# Patient Record
Sex: Female | Born: 1975 | Race: Black or African American | Hispanic: No | Smoking: Former smoker
Health system: Southern US, Community
[De-identification: ages and names within clinical notes are randomized; demographics above are authoritative.]

## PROBLEM LIST (undated history)

## (undated) DIAGNOSIS — Z9889 Other specified postprocedural states: Secondary | ICD-10-CM

## (undated) DIAGNOSIS — D649 Anemia, unspecified: Secondary | ICD-10-CM

## (undated) DIAGNOSIS — N3090 Cystitis, unspecified without hematuria: Secondary | ICD-10-CM

## (undated) DIAGNOSIS — R112 Nausea with vomiting, unspecified: Secondary | ICD-10-CM

## (undated) DIAGNOSIS — R51 Headache: Secondary | ICD-10-CM

## (undated) DIAGNOSIS — T884XXA Failed or difficult intubation, initial encounter: Secondary | ICD-10-CM

## (undated) DIAGNOSIS — R519 Headache, unspecified: Secondary | ICD-10-CM

## (undated) DIAGNOSIS — K219 Gastro-esophageal reflux disease without esophagitis: Secondary | ICD-10-CM

## (undated) DIAGNOSIS — I10 Essential (primary) hypertension: Secondary | ICD-10-CM

## (undated) DIAGNOSIS — N83209 Unspecified ovarian cyst, unspecified side: Secondary | ICD-10-CM

## (undated) DIAGNOSIS — K047 Periapical abscess without sinus: Secondary | ICD-10-CM

## (undated) DIAGNOSIS — T753XXA Motion sickness, initial encounter: Secondary | ICD-10-CM

## (undated) HISTORY — PX: ABDOMINAL HYSTERECTOMY: SHX81

## (undated) HISTORY — PX: WISDOM TOOTH EXTRACTION: SHX21

## (undated) HISTORY — PX: SPINAL FUSION: SHX223

## (undated) HISTORY — PX: CHOLECYSTECTOMY: SHX55

---

## 2004-10-26 ENCOUNTER — Emergency Department: Payer: Self-pay | Admitting: General Practice

## 2004-10-28 ENCOUNTER — Ambulatory Visit: Payer: Self-pay | Admitting: Emergency Medicine

## 2007-11-24 ENCOUNTER — Other Ambulatory Visit: Payer: Self-pay

## 2007-11-24 ENCOUNTER — Emergency Department: Payer: Self-pay | Admitting: Emergency Medicine

## 2008-06-17 ENCOUNTER — Emergency Department: Payer: Self-pay | Admitting: Internal Medicine

## 2008-06-20 ENCOUNTER — Emergency Department: Payer: Self-pay | Admitting: Emergency Medicine

## 2008-11-09 ENCOUNTER — Emergency Department: Payer: Self-pay | Admitting: Emergency Medicine

## 2010-02-14 ENCOUNTER — Emergency Department: Payer: Self-pay | Admitting: Emergency Medicine

## 2010-10-18 ENCOUNTER — Emergency Department: Payer: Self-pay | Admitting: Emergency Medicine

## 2011-08-20 ENCOUNTER — Emergency Department: Payer: Self-pay | Admitting: Internal Medicine

## 2011-08-20 ENCOUNTER — Ambulatory Visit: Payer: Self-pay | Admitting: Family Medicine

## 2011-11-28 ENCOUNTER — Emergency Department: Payer: Self-pay | Admitting: Emergency Medicine

## 2011-11-28 LAB — CBC
HCT: 39.8 % (ref 35.0–47.0)
HGB: 13.3 g/dL (ref 12.0–16.0)
MCHC: 33.5 g/dL (ref 32.0–36.0)
RDW: 13.7 % (ref 11.5–14.5)
WBC: 8.1 10*3/uL (ref 3.6–11.0)

## 2011-11-28 LAB — URINALYSIS, COMPLETE
Bilirubin,UR: NEGATIVE
Blood: NEGATIVE
Ketone: NEGATIVE
Nitrite: NEGATIVE
Ph: 7 (ref 4.5–8.0)
Protein: 30
Specific Gravity: 1.021 (ref 1.003–1.030)
Squamous Epithelial: 2
WBC UR: <1 /HPF (ref 0–5)

## 2011-11-28 LAB — COMPREHENSIVE METABOLIC PANEL
Calcium, Total: 9 mg/dL (ref 8.5–10.1)
Chloride: 104 mmol/L (ref 98–107)
Co2: 27 mmol/L (ref 21–32)
EGFR (African American): >60
EGFR (Non-African Amer.): >60
Glucose: 114 mg/dL — ABNORMAL HIGH (ref 65–99)
SGOT(AST): 21 U/L (ref 15–37)
SGPT (ALT): 20 U/L
Sodium: 139 mmol/L (ref 136–145)

## 2012-11-21 ENCOUNTER — Emergency Department: Payer: Self-pay | Admitting: Emergency Medicine

## 2012-11-21 LAB — URINALYSIS, COMPLETE
Glucose,UR: NEGATIVE mg/dL (ref 0–75)
Ketone: NEGATIVE
Ph: 6 (ref 4.5–8.0)
RBC,UR: NONE SEEN /HPF (ref 0–5)
Squamous Epithelial: 2
WBC UR: <1 /HPF (ref 0–5)

## 2012-11-21 LAB — LIPASE, BLOOD: Lipase: 147 U/L (ref 73–393)

## 2012-11-21 LAB — COMPREHENSIVE METABOLIC PANEL
Alkaline Phosphatase: 53 U/L (ref 50–136)
BUN: 8 mg/dL (ref 7–18)
Chloride: 107 mmol/L (ref 98–107)
Creatinine: 0.68 mg/dL (ref 0.60–1.30)
EGFR (African American): >60
Glucose: 95 mg/dL (ref 65–99)
Osmolality: 274 (ref 275–301)
SGOT(AST): 14 U/L — ABNORMAL LOW (ref 15–37)
SGPT (ALT): 15 U/L (ref 12–78)
Sodium: 138 mmol/L (ref 136–145)

## 2012-11-21 LAB — CBC
HCT: 36.4 % (ref 35.0–47.0)
MCH: 31.1 pg (ref 26.0–34.0)
MCHC: 33.9 g/dL (ref 32.0–36.0)
MCV: 92 fL (ref 80–100)
Platelet: 175 10*3/uL (ref 150–440)
RDW: 13.4 % (ref 11.5–14.5)

## 2013-08-18 ENCOUNTER — Ambulatory Visit: Payer: Self-pay | Admitting: Internal Medicine

## 2013-09-17 ENCOUNTER — Emergency Department: Payer: Self-pay | Admitting: Emergency Medicine

## 2013-10-19 ENCOUNTER — Ambulatory Visit: Payer: Self-pay | Admitting: Unknown Physician Specialty

## 2014-03-12 ENCOUNTER — Emergency Department: Payer: Self-pay | Admitting: Emergency Medicine

## 2014-03-14 ENCOUNTER — Emergency Department: Payer: Self-pay | Admitting: Internal Medicine

## 2014-03-29 ENCOUNTER — Ambulatory Visit: Payer: Self-pay | Admitting: Physician Assistant

## 2014-04-23 ENCOUNTER — Ambulatory Visit: Payer: Self-pay | Admitting: Unknown Physician Specialty

## 2014-10-04 ENCOUNTER — Ambulatory Visit: Payer: Self-pay | Admitting: Family Medicine

## 2014-10-18 HISTORY — PX: BACK SURGERY: SHX140

## 2015-03-26 ENCOUNTER — Ambulatory Visit
Admission: EM | Admit: 2015-03-26 | Discharge: 2015-03-26 | Disposition: A | Discharge status: Home / Self Care | Payer: BLUE CROSS/BLUE SHIELD | Attending: Internal Medicine | Admitting: Internal Medicine

## 2015-03-26 ENCOUNTER — Ambulatory Visit
Admit: 2015-03-26 | Discharge: 2015-03-26 | Disposition: A | Discharge status: Home / Self Care | Payer: BLUE CROSS/BLUE SHIELD | Attending: Internal Medicine | Admitting: Internal Medicine

## 2015-03-26 DIAGNOSIS — R102 Pelvic and perineal pain: Secondary | ICD-10-CM

## 2015-03-26 DIAGNOSIS — R1900 Intra-abdominal and pelvic swelling, mass and lump, unspecified site: Secondary | ICD-10-CM | POA: Diagnosis not present

## 2015-03-26 DIAGNOSIS — N949 Unspecified condition associated with female genital organs and menstrual cycle: Secondary | ICD-10-CM | POA: Diagnosis not present

## 2015-03-26 DIAGNOSIS — R109 Unspecified abdominal pain: Secondary | ICD-10-CM | POA: Diagnosis present

## 2015-03-26 DIAGNOSIS — R1032 Left lower quadrant pain: Secondary | ICD-10-CM

## 2015-03-26 HISTORY — DX: Unspecified ovarian cyst, unspecified side: N83.209

## 2015-03-26 HISTORY — DX: Cystitis, unspecified without hematuria: N30.90

## 2015-03-26 LAB — URINALYSIS COMPLETE WITH MICROSCOPIC (ARMC ONLY)
BILIRUBIN URINE: NEGATIVE
Bacteria, UA: NONE SEEN — AB
Glucose, UA: NEGATIVE mg/dL
HGB URINE DIPSTICK: NEGATIVE
Ketones, ur: NEGATIVE mg/dL
Leukocytes, UA: NEGATIVE
Nitrite: NEGATIVE
PROTEIN: NEGATIVE mg/dL
SPECIFIC GRAVITY, URINE: 1.025 (ref 1.005–1.030)
pH: 6.5 (ref 5.0–8.0)

## 2015-03-26 LAB — WET PREP, GENITAL
TRICH WET PREP: NONE SEEN
Yeast Wet Prep HPF POC: NONE SEEN

## 2015-03-26 MED ORDER — METRONIDAZOLE 500 MG PO TABS: 500.0000 mg | ORAL_TABLET | Freq: Two times a day (BID) | ORAL | Status: DC

## 2015-03-26 MED ORDER — HYDROCODONE-ACETAMINOPHEN 5-325 MG PO TABS: 2.0000 | ORAL_TABLET | ORAL | Status: DC | PRN

## 2015-03-26 MED ORDER — KETOROLAC TROMETHAMINE 60 MG/2ML IM SOLN
60.0000 mg | Freq: Once | INTRAMUSCULAR | Status: AC
  Administered 2015-03-26: 60 mg via INTRAMUSCULAR

## 2015-03-26 MED ORDER — NAPROXEN 500 MG PO TABS: 500.0000 mg | ORAL_TABLET | Freq: Two times a day (BID) | ORAL | Status: DC

## 2015-03-26 NOTE — Discharge Instructions (Signed)
Prescriptions for naprosyn sent to the pharmacy, and prescription for vicodin printed.  Go to hospital for pelvic ultrasound now.  Recheck at urgent care or ER if pain does not improve in a day or two.  Pelvic Pain Pelvic pain is pain felt below the belly button and between your hips. It can be caused by many different things. It is important to get help right away. This is especially true for severe, sharp, or unusual pain that comes on suddenly.  HOME CARE  Only take medicine as told by your doctor.  Rest as told by your doctor.  Eat a healthy diet, such as fruits, vegetables, and lean meats.  Drink enough fluids to keep your pee (urine) clear or pale yellow, or as told.  Avoid sex (intercourse) if it causes pain.  Apply warm or cold packs to your lower belly (abdomen). Use the type of pack that helps the pain.  Avoid situations that cause you stress.  Keep a journal to track your pain. Write down:  When the pain started.  Where it is located.  If there are things that seem to be related to the pain, such as food or your period.  Follow up with your doctor as told. GET HELP RIGHT AWAY IF:   You have heavy bleeding from the vagina.  You have more pelvic pain.  You feel lightheaded or pass out (faint).  You have chills.  You have pain when you pee or have blood in your pee.  You cannot stop having watery poop (diarrhea).  You cannot stop throwing up (vomiting).  You have a fever or lasting symptoms for more than 3 days.  You have a fever and your symptoms suddenly get worse.  You are being physically or sexually abused.  Your medicine does not help your pain.  You have fluid (discharge) coming from your vagina that is not normal. MAKE SURE YOU:  Understand these instructions.  Will watch your condition.  Will get help if you are not doing well or get worse. Document Released: 03/22/2008 Document Revised: 04/04/2012 Document Reviewed: 01/24/2012 [facility]  Patient Information 2015 [place], [place]. This information is not intended to replace advice given to you by your health care provider. Make sure you discuss any questions you have with your health care provider.

## 2015-03-26 NOTE — ED Notes (Signed)
Complains of low abdominal pain radiating to left lateral, left flank. +Nausea +Headache Hx of UTI

## 2015-03-26 NOTE — ED Provider Notes (Addendum)
CSN: [id number]     Arrival date & time 03/26/15  1245 History   First MD Initiated Contact with Patient 03/26/15 1355     Chief Complaint  Patient presents with  . Abdominal Pain   HPI   Patient presents with about a 3 day history of sharp intermittent left lower quadrant pain. It reminds her of discomfort that she has had with ovarian cysts in the past. Pain has been worsening for the last 3 days, it is nearly constant now. She has also developed some headache (frontal, bitemporal), malaise, and nausea. No vomiting . She is having some loose/watery stools and is also having her period right now. Often loose stools accompany her period. Denies pregnancy, has no female sexual partners. No fever, some night sweats. No dysuria, no urinary frequency. No cough, no runny nose or sore throat. Not short of breath.  Past Medical History  Diagnosis Date  . Bladder infection   . Ovarian cyst    Past Surgical History  Procedure Laterality Date  . Cholecystectomy    . Cesarean section     Family History  Problem Relation Age of Onset  . Diabetes Mother   . Hypertension Mother   . Diabetes Father   . Hypertension Father    History  Substance Use Topics  . Smoking status: Current Every Day Smoker -- 0.50 packs/day    Types: Cigarettes  . Smokeless tobacco: Not on file  . Alcohol Use: Yes     Comment: socially   OB History    No data available     Review of Systems  All other systems reviewed and are negative.   Allergies  Review of patient's allergies indicates no known allergies.  Home Medications    BP 142/93 mmHg  Pulse 93  Temp(Src) 98.3 F (36.8 C) (Oral)  Resp 18  Ht 5\' 8"  (1.727 m)  Wt 140 lb (63.504 kg)  BMI 21.29 kg/m2  SpO2 100%  LMP 03/22/2015 (Exact Date) Physical Exam  Constitutional: She is oriented to person, place, and time.  Alert, nicely groomed Laying down on the stretcher on her side, curled up. We'll to sit up, but tends to sit up flexed forward,  curled over abdomen.  HENT:  Head: Atraumatic.  Eyes:  Conjugate gaze, no eye redness/drainage  Neck: Neck supple.  Cardiovascular: Normal rate and regular rhythm.   Pulmonary/Chest: No respiratory distress. She has no wheezes. She has no rales.  Lungs clear, symmetric breath sounds  Abdominal: She exhibits no distension. There is no rebound and no guarding.  Mild to moderate diffuse tenderness to deep palpation, nonfocal, although patient perceives discomfort to be worse in the left lower quadrant at rest.  Genitourinary:  Vaginal mucosa is unremarkable. There is equivocal cervical motion tenderness. No adnexal enlargement, equivocal adnexal tenderness.  Musculoskeletal: Normal range of motion.  No leg swelling  Neurological: She is alert and oriented to person, place, and time.  Skin: Skin is warm and dry.  No cyanosis  Nursing note and vitals reviewed.   ED Course  Procedures (including critical care time) Labs Review Labs Reviewed  WET PREP, GENITAL - Abnormal; Notable for the following:    Clue Cells Wet Prep HPF POC FEW (*)    WBC, Wet Prep HPF POC FEW (*)    All other components within normal limits  URINALYSIS COMPLETEWITH MICROSCOPIC ([facility] ONLY) - Abnormal; Notable for the following:    Bacteria, UA NONE SEEN (*)    Squamous Epithelial /  LPF 6-30 (*)    All other components within normal limits  CHLAMYDIA/NGC RT PCR ([facility] ONLY)  URINE CULTURE   Wet prep is notable for a few clue cells, a few wbc's.  UA positive for squamous/epithelial cells.  Pt received IM toradol 60mg  at the urgent care for pain, with modest relief of pain.   Imaging Review US Transvaginal Non-ob  03/26/2015   CLINICAL DATA:  Pelvic pain for 3 days.  EXAM: TRANSABDOMINAL AND TRANSVAGINAL ULTRASOUND OF PELVIS  TECHNIQUE: Both transabdominal and transvaginal ultrasound examinations of the pelvis were performed. Transabdominal technique was performed for global imaging of the pelvis  including uterus, ovaries, adnexal regions, and pelvic cul-de-sac. It was necessary to proceed with endovaginal exam following the transabdominal exam to visualize the ovaries.  COMPARISON:  None  FINDINGS: Uterus  Measurements: 10.7 x 3.5 x 6.5 cm there is a 4.8 x 3.8 x 4.9 cm heterogeneous mass within the lower uterine segment.  Endometrium  Thickness: 6 mm. The endometrium was partially obscured by the lower uterine segment mass.  Right ovary  Measurements: 3.8 x 2.9 x 1.77. Normal appearance/no adnexal mass.  Left ovary  Measurements: 3.1 x 3.1 x 1.6 cm. Normal appearance/no adnexal mass.  Other findings  No free fluid.  IMPRESSION: There is a 4.9 cm mass in the lower uterine segment that is most likely a fibroid. This can be confirmed with a pelvic MRI.   Electronically Signed   By: Marybelle Killings M.D.   On: 03/26/2015 18:34   US Pelvis Complete  03/26/2015   CLINICAL DATA:  Pelvic pain for 3 days.  EXAM: TRANSABDOMINAL AND TRANSVAGINAL ULTRASOUND OF PELVIS  TECHNIQUE: Both transabdominal and transvaginal ultrasound examinations of the pelvis were performed. Transabdominal technique was performed for global imaging of the pelvis including uterus, ovaries, adnexal regions, and pelvic cul-de-sac. It was necessary to proceed with endovaginal exam following the transabdominal exam to visualize the ovaries.  COMPARISON:  None  FINDINGS: Uterus  Measurements: 10.7 x 3.5 x 6.5 cm there is a 4.8 x 3.8 x 4.9 cm heterogeneous mass within the lower uterine segment.  Endometrium  Thickness: 6 mm. The endometrium was partially obscured by the lower uterine segment mass.  Right ovary  Measurements: 3.8 x 2.9 x 1.77. Normal appearance/no adnexal mass.  Left ovary  Measurements: 3.1 x 3.1 x 1.6 cm. Normal appearance/no adnexal mass.  Other findings  No free fluid.  IMPRESSION: There is a 4.9 cm mass in the lower uterine segment that is most likely a fibroid. This can be confirmed with a pelvic MRI.   Electronically Signed    By: Marybelle Killings M.D.   On: 03/26/2015 18:34     MDM   1. Acute pelvic pain, female   2. LLQ pain    Ultrasound demonstrates fibroid uterus.  Discussed with patient by phone 03/27/15 10:50 am; pt given office number for [facility] and encouraged to followup for persistent discomfort, to discuss further management options.  Pelvic pain is unchanged this am.  Rx naprosyn, rx #6 vicodin, and rx flagyl sent to pharmacy/given to pt last night at discharge.    Sherlene Shams, MD 03/27/15 [place], MD 03/27/15 [phone]

## 2015-03-27 ENCOUNTER — Telehealth: Payer: Self-pay | Admitting: Internal Medicine

## 2015-03-27 LAB — CHLAMYDIA/NGC RT PCR (ARMC ONLY)
CHLAMYDIA TR: NOT DETECTED
N gonorrhoeae: NOT DETECTED

## 2015-03-28 LAB — URINE CULTURE

## 2015-04-29 ENCOUNTER — Encounter
Admission: RE | Admit: 2015-04-29 | Discharge: 2015-04-29 | Disposition: A | Discharge status: Home / Self Care | Payer: BLUE CROSS/BLUE SHIELD | Source: Ambulatory Visit | Attending: Obstetrics and Gynecology | Admitting: Obstetrics and Gynecology

## 2015-04-29 DIAGNOSIS — Z833 Family history of diabetes mellitus: Secondary | ICD-10-CM | POA: Diagnosis not present

## 2015-04-29 DIAGNOSIS — Z808 Family history of malignant neoplasm of other organs or systems: Secondary | ICD-10-CM | POA: Diagnosis not present

## 2015-04-29 DIAGNOSIS — Z01812 Encounter for preprocedural laboratory examination: Secondary | ICD-10-CM | POA: Insufficient documentation

## 2015-04-29 DIAGNOSIS — Z8249 Family history of ischemic heart disease and other diseases of the circulatory system: Secondary | ICD-10-CM | POA: Insufficient documentation

## 2015-04-29 HISTORY — DX: Gastro-esophageal reflux disease without esophagitis: K21.9

## 2015-04-29 HISTORY — DX: Anemia, unspecified: D64.9

## 2015-04-29 LAB — BASIC METABOLIC PANEL
Anion gap: 12 (ref 5–15)
BUN: 6 mg/dL (ref 6–20)
CALCIUM: 9 mg/dL (ref 8.9–10.3)
CHLORIDE: 99 mmol/L — AB (ref 101–111)
CO2: 25 mmol/L (ref 22–32)
Creatinine, Ser: 0.91 mg/dL (ref 0.44–1.00)
GFR calc Af Amer: >60 mL/min (ref >60)
Glucose, Bld: 110 mg/dL — ABNORMAL HIGH (ref 65–99)
POTASSIUM: 2.8 mmol/L — AB (ref 3.5–5.1)
Sodium: 136 mmol/L (ref 135–145)

## 2015-04-29 LAB — TYPE AND SCREEN
ABO/RH(D): A POS
Antibody Screen: NEGATIVE

## 2015-04-29 LAB — CBC
HCT: 38.6 % (ref 35.0–47.0)
Hemoglobin: 12.9 g/dL (ref 12.0–16.0)
MCH: 30.9 pg (ref 26.0–34.0)
MCHC: 33.3 g/dL (ref 32.0–36.0)
MCV: 92.9 fL (ref 80.0–100.0)
Platelets: 210 10*3/uL (ref 150–440)
RBC: 4.16 MIL/uL (ref 3.80–5.20)
RDW: 13.7 % (ref 11.5–14.5)
WBC: 8.1 10*3/uL (ref 3.6–11.0)

## 2015-04-29 LAB — ABO/RH: ABO/RH(D): A POS

## 2015-04-29 NOTE — Patient Instructions (Signed)
  Your procedure is scheduled on: 05/06/15 Tues Report to Day Surgery. To find out your arrival time please call [phone] between 1PM - 3PM on 05/05/15 Mon.  Remember: Instructions that are not followed completely may result in serious medical risk, up to and including death, or upon the discretion of your surgeon and anesthesiologist your surgery may need to be rescheduled.    __x__ 1. Do not eat food or drink liquids after midnight. No gum chewing or hard candies.     __x__ 2. No Alcohol for 24 hours before or after surgery.   ____ 3. Bring all medications with you on the day of surgery if instructed.    __x__ 4. Notify your doctor if there is any change in your medical condition     (cold, fever, infections).     Do not wear jewelry, make-up, hairpins, clips or nail polish.  Do not wear lotions, powders, or perfumes. You may wear deodorant.  Do not shave 48 hours prior to surgery. Men may shave face and neck.  Do not bring valuables to the hospital.    [facility] is not responsible for any belongings or valuables.               Contacts, dentures or bridgework may not be worn into surgery.  Leave your suitcase in the car. After surgery it may be brought to your room.  For patients admitted to the hospital, discharge time is determined by your                treatment team.   Patients discharged the day of surgery will not be allowed to drive home.   Please read over the following fact sheets that you were given:      ____ Take these medicines the morning of surgery with A SIP OF WATER:    1.   2.   3.   4.  5.  6.  ____ Fleet Enema (as directed)   _x___ Use CHG Soap as directed  ____ Use inhalers on the day of surgery  ____ Stop metformin 2 days prior to surgery    ____ Take 1/2 of usual insulin dose the night before surgery and none on the morning of surgery.   ____ Stop Coumadin/Plavix/aspirin on   _x___ Stop Anti-inflammatories on today 04/29/15   ____  Stop supplements until after surgery.    ____ Bring C-Pap to the hospital.

## 2015-04-29 NOTE — OR Nursing (Signed)
Dr Ilda Basset notifed of need for potassium supplement.

## 2015-04-29 NOTE — OR Nursing (Signed)
Dr Ilda Basset notified regarding patients elevated BP, and informed patient does not have a primary care physician.

## 2015-05-06 ENCOUNTER — Ambulatory Visit: Payer: BLUE CROSS/BLUE SHIELD | Admitting: Anesthesiology

## 2015-05-06 ENCOUNTER — Observation Stay
Admission: RE | Admit: 2015-05-06 | Discharge: 2015-05-07 | Disposition: A | Discharge status: Home / Self Care | Payer: BLUE CROSS/BLUE SHIELD | Source: Ambulatory Visit | Attending: Obstetrics and Gynecology | Admitting: Obstetrics and Gynecology

## 2015-05-06 ENCOUNTER — Encounter: Payer: Self-pay | Admitting: *Deleted

## 2015-05-06 ENCOUNTER — Encounter
Admission: RE | Disposition: A | Discharge status: Home / Self Care | Payer: Self-pay | Source: Ambulatory Visit | Attending: Obstetrics and Gynecology

## 2015-05-06 DIAGNOSIS — Z841 Family history of disorders of kidney and ureter: Secondary | ICD-10-CM | POA: Diagnosis not present

## 2015-05-06 DIAGNOSIS — N920 Excessive and frequent menstruation with regular cycle: Secondary | ICD-10-CM | POA: Insufficient documentation

## 2015-05-06 DIAGNOSIS — Z9049 Acquired absence of other specified parts of digestive tract: Secondary | ICD-10-CM | POA: Diagnosis not present

## 2015-05-06 DIAGNOSIS — D251 Intramural leiomyoma of uterus: Secondary | ICD-10-CM | POA: Diagnosis not present

## 2015-05-06 DIAGNOSIS — Z8249 Family history of ischemic heart disease and other diseases of the circulatory system: Secondary | ICD-10-CM | POA: Insufficient documentation

## 2015-05-06 DIAGNOSIS — Z833 Family history of diabetes mellitus: Secondary | ICD-10-CM | POA: Diagnosis not present

## 2015-05-06 DIAGNOSIS — Z791 Long term (current) use of non-steroidal anti-inflammatories (NSAID): Secondary | ICD-10-CM | POA: Diagnosis not present

## 2015-05-06 DIAGNOSIS — N888 Other specified noninflammatory disorders of cervix uteri: Secondary | ICD-10-CM | POA: Diagnosis not present

## 2015-05-06 DIAGNOSIS — F17200 Nicotine dependence, unspecified, uncomplicated: Secondary | ICD-10-CM | POA: Insufficient documentation

## 2015-05-06 DIAGNOSIS — Z9889 Other specified postprocedural states: Secondary | ICD-10-CM | POA: Insufficient documentation

## 2015-05-06 DIAGNOSIS — D259 Leiomyoma of uterus, unspecified: Secondary | ICD-10-CM | POA: Diagnosis present

## 2015-05-06 DIAGNOSIS — Z90710 Acquired absence of both cervix and uterus: Secondary | ICD-10-CM | POA: Diagnosis present

## 2015-05-06 DIAGNOSIS — N800 Endometriosis of uterus: Secondary | ICD-10-CM | POA: Diagnosis not present

## 2015-05-06 DIAGNOSIS — N946 Dysmenorrhea, unspecified: Secondary | ICD-10-CM | POA: Diagnosis not present

## 2015-05-06 DIAGNOSIS — R109 Unspecified abdominal pain: Secondary | ICD-10-CM | POA: Diagnosis not present

## 2015-05-06 DIAGNOSIS — I10 Essential (primary) hypertension: Secondary | ICD-10-CM | POA: Diagnosis not present

## 2015-05-06 DIAGNOSIS — Z800 Family history of malignant neoplasm of digestive organs: Secondary | ICD-10-CM | POA: Insufficient documentation

## 2015-05-06 DIAGNOSIS — N838 Other noninflammatory disorders of ovary, fallopian tube and broad ligament: Secondary | ICD-10-CM | POA: Diagnosis present

## 2015-05-06 HISTORY — PX: CYSTOSCOPY: SHX5120

## 2015-05-06 HISTORY — PX: LAPAROSCOPIC HYSTERECTOMY: SHX1926

## 2015-05-06 LAB — POCT I-STAT 3, (NA,K, HGB,HCT): Potassium: 2.9 mmol/L — AB (ref 3.4–5.3)

## 2015-05-06 LAB — POCT PREGNANCY, URINE: PREG TEST UR: NEGATIVE

## 2015-05-06 SURGERY — HYSTERECTOMY, TOTAL, LAPAROSCOPIC
Anesthesia: General | Wound class: Clean Contaminated

## 2015-05-06 MED ORDER — SCOPOLAMINE 1 MG/3DAYS TD PT72
1.0000 | MEDICATED_PATCH | TRANSDERMAL | Status: DC
  Administered 2015-05-07: 1.5 mg via TRANSDERMAL
  Filled 2015-05-06 (×2): qty 1

## 2015-05-06 MED ORDER — HYDROMORPHONE HCL 1 MG/ML IJ SOLN
0.5000 mg | INTRAMUSCULAR | Status: DC | PRN
  Administered 2015-05-06: 0.5 mg via INTRAVENOUS
  Filled 2015-05-06: qty 1

## 2015-05-06 MED ORDER — SIMETHICONE 80 MG PO CHEW
80.0000 mg | CHEWABLE_TABLET | Freq: Two times a day (BID) | ORAL | Status: DC
Start: 2015-05-06 — End: 2015-05-07

## 2015-05-06 MED ORDER — BISACODYL 5 MG PO TBEC: 5.0000 mg | DELAYED_RELEASE_TABLET | Freq: Every day | ORAL | Status: DC | PRN

## 2015-05-06 MED ORDER — ONDANSETRON HCL 4 MG/2ML IJ SOLN
INTRAMUSCULAR | Status: DC | PRN
  Administered 2015-05-06: 4 mg via INTRAVENOUS

## 2015-05-06 MED ORDER — FAMOTIDINE 20 MG PO TABS
20.0000 mg | ORAL_TABLET | Freq: Once | ORAL | Status: AC
  Administered 2015-05-06: 20 mg via ORAL

## 2015-05-06 MED ORDER — HYDROMORPHONE HCL 1 MG/ML IJ SOLN
0.5000 mg | Freq: Once | INTRAMUSCULAR | Status: AC
  Administered 2015-05-06: 0.5 mg via INTRAVENOUS
  Filled 2015-05-06: qty 1

## 2015-05-06 MED ORDER — DEXAMETHASONE SODIUM PHOSPHATE 4 MG/ML IJ SOLN
INTRAMUSCULAR | Status: DC | PRN
  Administered 2015-05-06: 10 mg via INTRAVENOUS

## 2015-05-06 MED ORDER — PROPOFOL 10 MG/ML IV BOLUS
INTRAVENOUS | Status: DC | PRN
  Administered 2015-05-06: 140 mg via INTRAVENOUS

## 2015-05-06 MED ORDER — POTASSIUM CHLORIDE CRYS ER 20 MEQ PO TBCR
40.0000 meq | EXTENDED_RELEASE_TABLET | Freq: Three times a day (TID) | ORAL | Status: DC
  Administered 2015-05-07 (×2): 40 meq via ORAL
  Filled 2015-05-06 (×2): qty 2

## 2015-05-06 MED ORDER — BUPIVACAINE HCL (PF) 0.5 % IJ SOLN
INTRAMUSCULAR | Status: AC
  Filled 2015-05-06: qty 30

## 2015-05-06 MED ORDER — LACTATED RINGERS IV SOLN
INTRAVENOUS | Status: DC
  Administered 2015-05-06: 15:00:00 via INTRAVENOUS

## 2015-05-06 MED ORDER — ROCURONIUM BROMIDE 100 MG/10ML IV SOLN
INTRAVENOUS | Status: DC | PRN
  Administered 2015-05-06: 10 mg via INTRAVENOUS
  Administered 2015-05-06: 20 mg via INTRAVENOUS
  Administered 2015-05-06: 10 mg via INTRAVENOUS
  Administered 2015-05-06: 20 mg via INTRAVENOUS
  Administered 2015-05-06: 30 mg via INTRAVENOUS

## 2015-05-06 MED ORDER — METOPROLOL TARTRATE 1 MG/ML IV SOLN
INTRAVENOUS | Status: DC | PRN
  Administered 2015-05-06: 3 mg via INTRAVENOUS

## 2015-05-06 MED ORDER — ONDANSETRON HCL 4 MG/2ML IJ SOLN
4.0000 mg | Freq: Four times a day (QID) | INTRAMUSCULAR | Status: DC | PRN
  Administered 2015-05-06 – 2015-05-07 (×4): 4 mg via INTRAVENOUS
  Filled 2015-05-06 (×4): qty 2

## 2015-05-06 MED ORDER — POLYETHYLENE GLYCOL 3350 17 G PO PACK
17.0000 g | PACK | Freq: Every day | ORAL | Status: DC
  Administered 2015-05-07: 17 g via ORAL
  Filled 2015-05-06: qty 1

## 2015-05-06 MED ORDER — HYDROMORPHONE HCL 1 MG/ML IJ SOLN
0.5000 mg | INTRAMUSCULAR | Status: AC | PRN
  Administered 2015-05-06 (×4): 0.5 mg via INTRAVENOUS

## 2015-05-06 MED ORDER — LIDOCAINE HCL (CARDIAC) 20 MG/ML IV SOLN
INTRAVENOUS | Status: DC | PRN
  Administered 2015-05-06: 100 mg via INTRAVENOUS

## 2015-05-06 MED ORDER — CEFAZOLIN SODIUM-DEXTROSE 2-3 GM-% IV SOLR
INTRAVENOUS | Status: AC
  Administered 2015-05-06: 2 g via INTRAVENOUS
  Filled 2015-05-06: qty 50

## 2015-05-06 MED ORDER — NICOTINE 14 MG/24HR TD PT24
14.0000 mg | MEDICATED_PATCH | Freq: Every day | TRANSDERMAL | Status: DC
Start: 2015-05-06 — End: 2015-05-07
  Administered 2015-05-06 – 2015-05-07 (×2): 14 mg via TRANSDERMAL
  Filled 2015-05-06 (×2): qty 1

## 2015-05-06 MED ORDER — GLYCOPYRROLATE 0.2 MG/ML IJ SOLN
INTRAMUSCULAR | Status: DC | PRN
  Administered 2015-05-06: 0.6 mg via INTRAVENOUS

## 2015-05-06 MED ORDER — HYDROMORPHONE HCL 1 MG/ML IJ SOLN
INTRAMUSCULAR | Status: AC
  Administered 2015-05-06: 0.5 mg via INTRAVENOUS
  Filled 2015-05-06: qty 1

## 2015-05-06 MED ORDER — FENTANYL CITRATE (PF) 100 MCG/2ML IJ SOLN
INTRAMUSCULAR | Status: DC | PRN
  Administered 2015-05-06 (×7): 50 ug via INTRAVENOUS

## 2015-05-06 MED ORDER — MIDAZOLAM HCL 2 MG/2ML IJ SOLN
INTRAMUSCULAR | Status: DC | PRN
  Administered 2015-05-06: 2 mg via INTRAVENOUS

## 2015-05-06 MED ORDER — CEFAZOLIN SODIUM-DEXTROSE 2-3 GM-% IV SOLR
INTRAVENOUS | Status: DC | PRN
  Administered 2015-05-06: 2 g via INTRAVENOUS

## 2015-05-06 MED ORDER — ONDANSETRON HCL 4 MG/2ML IJ SOLN: 4.0000 mg | Freq: Once | INTRAMUSCULAR | Status: DC | PRN

## 2015-05-06 MED ORDER — IBUPROFEN 600 MG PO TABS
600.0000 mg | ORAL_TABLET | Freq: Four times a day (QID) | ORAL | Status: DC | PRN
  Administered 2015-05-06 – 2015-05-07 (×2): 600 mg via ORAL
  Filled 2015-05-06 (×2): qty 1

## 2015-05-06 MED ORDER — FENTANYL CITRATE (PF) 100 MCG/2ML IJ SOLN
25.0000 ug | INTRAMUSCULAR | Status: DC | PRN
  Administered 2015-05-06 (×4): 25 ug via INTRAVENOUS

## 2015-05-06 MED ORDER — BUPIVACAINE HCL 0.5 % IJ SOLN
INTRAMUSCULAR | Status: DC | PRN
  Administered 2015-05-06: 18 mL

## 2015-05-06 MED ORDER — HYDROCODONE-ACETAMINOPHEN 5-325 MG PO TABS
1.0000 | ORAL_TABLET | Freq: Four times a day (QID) | ORAL | Status: DC | PRN
  Administered 2015-05-06 – 2015-05-07 (×4): 2 via ORAL
  Filled 2015-05-06 (×4): qty 2

## 2015-05-06 MED ORDER — FENTANYL CITRATE (PF) 100 MCG/2ML IJ SOLN
INTRAMUSCULAR | Status: AC
  Administered 2015-05-06: 25 ug via INTRAVENOUS
  Filled 2015-05-06: qty 2

## 2015-05-06 MED ORDER — POTASSIUM CHLORIDE 2 MEQ/ML IV SOLN
INTRAVENOUS | Status: DC
  Administered 2015-05-06 – 2015-05-07 (×2): via INTRAVENOUS
  Filled 2015-05-06 (×5): qty 1000

## 2015-05-06 MED ORDER — LACTATED RINGERS IV SOLN: INTRAVENOUS | Status: DC | PRN

## 2015-05-06 MED ORDER — LACTATED RINGERS IV SOLN
INTRAVENOUS | Status: DC
  Administered 2015-05-06 (×3): via INTRAVENOUS

## 2015-05-06 MED ORDER — ONDANSETRON HCL 4 MG PO TABS: 4.0000 mg | ORAL_TABLET | Freq: Four times a day (QID) | ORAL | Status: DC | PRN

## 2015-05-06 MED ORDER — CEFAZOLIN SODIUM-DEXTROSE 2-3 GM-% IV SOLR
2.0000 g | Freq: Once | INTRAVENOUS | Status: AC
  Administered 2015-05-06: 2 g via INTRAVENOUS

## 2015-05-06 MED ORDER — FAMOTIDINE 20 MG PO TABS
ORAL_TABLET | ORAL | Status: AC
  Administered 2015-05-06: 20 mg via ORAL
  Filled 2015-05-06: qty 1

## 2015-05-06 MED ORDER — HYDROMORPHONE HCL 1 MG/ML IJ SOLN
INTRAMUSCULAR | Status: AC
Start: 2015-05-06 — End: 2015-05-06
  Administered 2015-05-06: 0.5 mg via INTRAVENOUS
  Filled 2015-05-06: qty 1

## 2015-05-06 MED ORDER — PROMETHAZINE HCL 25 MG/ML IJ SOLN
12.5000 mg | INTRAMUSCULAR | Status: DC | PRN
  Administered 2015-05-06 – 2015-05-07 (×2): 12.5 mg via INTRAVENOUS
  Filled 2015-05-06 (×3): qty 1

## 2015-05-06 MED ORDER — NEOSTIGMINE METHYLSULFATE 10 MG/10ML IV SOLN
INTRAVENOUS | Status: DC | PRN
  Administered 2015-05-06: 4 mg via INTRAVENOUS

## 2015-05-06 SURGICAL SUPPLY — 56 items
BAG URO DRAIN 2000ML W/SPOUT (MISCELLANEOUS) ×2 IMPLANT
BLADE SURG SZ11 CARB STEEL (BLADE) ×2 IMPLANT
CANISTER SUCT 1200ML W/VALVE (MISCELLANEOUS) ×2 IMPLANT
CATH FOLEY 2WAY  5CC 16FR (CATHETERS) ×1
CATH ROBINSON RED A/P 16FR (CATHETERS) ×2 IMPLANT
CATH URTH 16FR FL 2W BLN LF (CATHETERS) ×1 IMPLANT
CHLORAPREP W/TINT 26ML (MISCELLANEOUS) ×2 IMPLANT
CORD MONOPOLAR M/FML 12FT (MISCELLANEOUS) ×2 IMPLANT
DEFOGGER SCOPE WARMER CLEARIFY (MISCELLANEOUS) ×2 IMPLANT
DEVICE SUTURE ENDOST 10MM (ENDOMECHANICALS) ×2 IMPLANT
DRAPE LEGGINS SURG 28X43 STRL (DRAPES) ×2 IMPLANT
DRAPE UNDER BUTTOCK W/FLU (DRAPES) ×2 IMPLANT
DRSG TEGADERM 2-3/8X2-3/4 SM (GAUZE/BANDAGES/DRESSINGS) ×6 IMPLANT
GAUZE SPONGE NON-WVN 2X2 STRL (MISCELLANEOUS) ×2 IMPLANT
GLOVE BIO SURGEON STRL SZ7 (GLOVE) ×8 IMPLANT
GLOVE INDICATOR 7.5 STRL GRN (GLOVE) ×2 IMPLANT
GOWN STRL REUS W/ TWL LRG LVL3 (GOWN DISPOSABLE) ×2 IMPLANT
GOWN STRL REUS W/ TWL XL LVL3 (GOWN DISPOSABLE) ×2 IMPLANT
GOWN STRL REUS W/TWL LRG LVL3 (GOWN DISPOSABLE) ×2
GOWN STRL REUS W/TWL XL LVL3 (GOWN DISPOSABLE) ×2
IRRIGATION STRYKERFLOW (MISCELLANEOUS) ×1 IMPLANT
IRRIGATOR STRYKERFLOW (MISCELLANEOUS) ×2
IV LACTATED RINGERS 1000ML (IV SOLUTION) ×2 IMPLANT
JELLY LUB 2OZ STRL (MISCELLANEOUS) ×1
JELLY LUBE 2OZ STRL (MISCELLANEOUS) ×1 IMPLANT
KIT RM TURNOVER CYSTO AR (KITS) ×2 IMPLANT
LABEL OR SOLS (LABEL) ×2 IMPLANT
LIGASURE BLUNT 5MM 37CM (INSTRUMENTS) ×2 IMPLANT
LIQUID BAND (GAUZE/BANDAGES/DRESSINGS) ×2 IMPLANT
MANIPULATOR VCARE LG CRV RETR (MISCELLANEOUS) IMPLANT
MANIPULATOR VCARE STD CRV RETR (MISCELLANEOUS) ×2 IMPLANT
NS IRRIG 500ML POUR BTL (IV SOLUTION) ×2 IMPLANT
OCCLUDER COLPOPNEUMO (BALLOONS) ×2 IMPLANT
PACK CYSTO AR (MISCELLANEOUS) ×2 IMPLANT
PACK GYN LAPAROSCOPIC (MISCELLANEOUS) ×2 IMPLANT
PAD OB MATERNITY 4.3X12.25 (PERSONAL CARE ITEMS) ×2 IMPLANT
PAD PREP 24X41 OB/GYN DISP (PERSONAL CARE ITEMS) ×2 IMPLANT
PAD TRENDELENBURG OR TABLE (MISCELLANEOUS) ×2 IMPLANT
SCISSORS METZENBAUM CVD 33 (INSTRUMENTS) ×2 IMPLANT
SET CYSTO W/LG BORE CLAMP LF (SET/KITS/TRAYS/PACK) ×2 IMPLANT
SLEEVE ENDOPATH XCEL 5M (ENDOMECHANICALS) ×2 IMPLANT
SOLUTION ELECTROLUBE (MISCELLANEOUS) ×2 IMPLANT
SPONGE VERSALON 2X2 STRL (MISCELLANEOUS) ×2
SPONGE XRAY 4X4 16PLY STRL (MISCELLANEOUS) ×2 IMPLANT
SUT ENDO VLOC 180-0-8IN (SUTURE) ×2 IMPLANT
SUT MNCRL AB 4-0 PS2 18 (SUTURE) ×2 IMPLANT
SUT VIC AB 0 UR5 27 (SUTURE) ×2 IMPLANT
SUT VIC AB 2-0 UR6 27 (SUTURE) IMPLANT
SUT VICRYL 0 AB UR-6 (SUTURE) ×4 IMPLANT
SYR 50ML LL SCALE MARK (SYRINGE) ×2 IMPLANT
SYRINGE 10CC LL (SYRINGE) ×2 IMPLANT
TROCAR BALLN 12MMX100 BLUNT (TROCAR) IMPLANT
TROCAR BLUNT TIP 12MM OMST12BT (TROCAR) IMPLANT
TROCAR ENDO BLADELESS 11MM (ENDOMECHANICALS) ×2 IMPLANT
TROCAR XCEL NON-BLD 5MMX100MML (ENDOMECHANICALS) ×2 IMPLANT
TUBING INSUFFLATOR HEATED (MISCELLANEOUS) ×2 IMPLANT

## 2015-05-06 NOTE — Anesthesia Postprocedure Evaluation (Signed)
  Anesthesia Post-op Note  Patient: Linda Rhodes  Procedure(s) Performed: Procedure(s): HYSTERECTOMY TOTAL LAPAROSCOPIC (N/A) CYSTOSCOPY (N/A)  Anesthesia type:General  Patient location: PACU  Post pain: Pain level controlled  Post assessment: Post-op Vital signs reviewed, Patient's Cardiovascular Status Stable, Respiratory Function Stable, Patent Airway and No signs of Nausea or vomiting  Post vital signs: Reviewed and stable  Last Vitals:  Filed Vitals:   05/06/15 1412  BP: 154/94  Pulse: 91  Temp: 36.4 C  Resp: 22    Level of consciousness: awake, alert  and patient cooperative  Complications: No apparent anesthesia complications

## 2015-05-06 NOTE — Progress Notes (Signed)
GYN Post Op Check Note  05/06/2015 - 6:31 PM  Brief Clinical History: 39 y.o. POD#0 s/p TLH/BS/cysto (EBL:121mL) for fibroids.   PMHx significant for HTN, tobacco abuse.  Subjective: Patient with moderate PONV with mild pain. +voiding and took some broth w/o issue. Minimal VB  Objective:  Current Vital Signs 24h Vital Sign Ranges  T 97.6 F (36.4 C) Temp  Avg: 98.2 F (36.8 C)  Min: 97.5 F (36.4 C)  Max: 99 F (37.2 C)  BP 116/81 mmHg BP  Min: 116/81  Max: 154/94  HR 91 Pulse  Avg: 91.4  Min: 78  Max: 118  RR 18 Resp  Avg: 16.5  Min: 12  Max: 22  SaO2 98 % RA SpO2  Avg: 98.4 %  Min: 93 %  Max: 100 %       24 Hour I/O Current Shift I/O  Time Ins Outs   07/19 0701 - 07/19 1900 In: 1900 [I.V.:1900] Out: 1900 [Urine:1750]    General: mild distress, pt upset about the nausea Abdomen: +BS, soft, NTTP, mildly distended, c/d/i x 4 GU: no gross VB CV: RRR, No MRGs Pulm: CTAB Extremities: no clubbing, cyanosis or edema Neuro: A&O x 3  Medications: Current Facility-Administered Medications  Medication Dose Route Frequency Provider Last Rate Last Dose  . bisacodyl (DULCOLAX) EC tablet 5 mg  5 mg Oral Daily PRN Aletha Halim, MD      . HYDROcodone-acetaminophen (NORCO/VICODIN) 5-325 MG per tablet 1-2 tablet  1-2 tablet Oral Q6H PRN Aletha Halim, MD   2 tablet at 05/06/15 1524  . HYDROmorphone (DILAUDID) injection 0.5 mg  0.5 mg Intravenous Q2H PRN Aletha Halim, MD      . ibuprofen (ADVIL,MOTRIN) tablet 600 mg  600 mg Oral Q6H PRN Aletha Halim, MD   600 mg at 05/06/15 1524  . lactated ringers 1,000 mL with potassium chloride 40 mEq/L Pediatric IV infusion   Intravenous Continuous Aletha Halim, MD      . ondansetron (ZOFRAN) tablet 4 mg  4 mg Oral Q6H PRN Aletha Halim, MD       Or  . ondansetron (ZOFRAN) injection 4 mg  4 mg Intravenous Q6H PRN Aletha Halim, MD   4 mg at 05/06/15 1751  . [START ON 05/07/2015] polyethylene glycol (MIRALAX / GLYCOLAX) packet 17 g  17  g Oral Daily Aletha Halim, MD      . potassium chloride SA (K-DUR,KLOR-CON) CR tablet 40 mEq  40 mEq Oral TID WC Aletha Halim, MD      . promethazine (PHENERGAN) injection 12.5 mg  12.5 mg Intravenous Q4H PRN Aletha Halim, MD      . scopolamine (TRANSDERM-SCOP) 1 MG/3DAYS 1.5 mg  1 patch Transdermal Q72H Aletha Halim, MD      . simethicone (MYLICON) chewable tablet 80 mg  80 mg Oral BID WC Aletha Halim, MD        Laboratory: Recent Labs Lab 05/06/15 [phone]  K 2.9*     A/P: Patient stable with PONV *GYN: routine post op care. Follow up final pathology *FEN/GI: MIVF, ADAT to regular. Added scop patch and prn phenergan to regimen *PPx: SCDs, OOB ad lit *Tobacco: pt desires to go outside and smoke but told to hold off on that while in house and if would like patch to let us know *Pain:controlled with PRN PO and rare use IV right after transfer to floor *Dispo: likely tomorrow after breakfast *Full Code  Durene Romans MD [place]  Pager: [phone]

## 2015-05-06 NOTE — H&P (Signed)
GYN H&P  Patient ready to proceed and will move forward with TLH/bilateral salpingectomy and cysto. Potassium still low despite PO potassium. Will supplement post op. UPT negative. Ready to proceed when the OR is ready.  Durene Romans MD [facility]  Pager: [phone]

## 2015-05-06 NOTE — Anesthesia Procedure Notes (Signed)
Procedure Name: Intubation Date/Time: 05/06/2015 7:44 AM Performed by: Silvana Newness Pre-anesthesia Checklist: Patient identified, Emergency Drugs available, Suction available, Patient being monitored and Timeout performed Patient Re-evaluated:Patient Re-evaluated prior to inductionOxygen Delivery Method: Circle system utilized Preoxygenation: Pre-oxygenation with 100% oxygen Intubation Type: IV induction Ventilation: Mask ventilation without difficulty Grade View: Grade II Tube type: Oral Tube size: 7.0 mm Number of attempts: 1 Airway Equipment and Method: Rigid stylet Placement Confirmation: ETT inserted through vocal cords under direct vision,  positive ETCO2 and breath sounds checked- equal and bilateral Secured at: 20 cm Tube secured with: Tape Dental Injury: Teeth and Oropharynx as per pre-operative assessment

## 2015-05-06 NOTE — Anesthesia Preprocedure Evaluation (Addendum)
Anesthesia Evaluation  Patient identified by MRN, date of birth, ID band Patient awake    Reviewed: Allergy & Precautions, NPO status , Patient's Chart, lab work & pertinent test results, reviewed documented beta blocker date and time   Airway Mallampati: III  TM Distance: >3 FB     Dental  (+) Chipped   Pulmonary Current Smoker,          Cardiovascular     Neuro/Psych    GI/Hepatic GERD-  ,  Endo/Other    Renal/GU      Musculoskeletal   Abdominal   Peds  Hematology  (+) anemia ,   Anesthesia Other Findings Overbite. Poor dentition.  Reproductive/Obstetrics                            Anesthesia Physical Anesthesia Plan  ASA: II  Anesthesia Plan: General   Post-op Pain Management:    Induction: Intravenous  Airway Management Planned: Oral ETT  Additional Equipment:   Intra-op Plan:   Post-operative Plan:   Informed Consent: I have reviewed the patients History and Physical, chart, labs and discussed the procedure including the risks, benefits and alternatives for the proposed anesthesia with the patient or authorized representative who has indicated his/her understanding and acceptance.     Plan Discussed with: CRNA  Anesthesia Plan Comments:         Anesthesia Quick Evaluation

## 2015-05-06 NOTE — Op Note (Addendum)
Operative Note   05/06/2015  PRE-OP DIAGNOSIS *Fibroid uterus *menorrhagia *abdominal pain   POST-OP DIAGNOSIS *Same   SURGEON: Surgeon(s) and Role:    * Aletha Halim, MD - Primary  ASSISTANT: Gae Dry, MD - Assisting  PROCEDURE: Total laparoscopic hysterectomy, bilateral salpingectomy, cystoscopy  ANESTHESIA: General and local  ESTIMATED BLOOD LOSS: 114mL  DRAINS: indwelling foley 565mL   TOTAL IV FLUIDS: 1539mL crystalloid  VTE PROPHYLAXIS: SCDs to the bilateral lower extremities  ANTIBIOTICS: Two grams of Cefazolin were given and re-dosed 3 hours after administration  SPECIMENS: cervix, uterus, bilateral fallopian tubes  DISPOSITION: PACU - hemodynamically stable.  CONDITION: stable  COMPLICATIONS: None  FINDINGS: 10-12 week fibroid uterus on exam. On diagnostic laparoscopic, 4-5cm posterior lower uterine segment, intramural fibroid approx 2cm above the v-care ring. No intra-abdominal adhesions were noted except one from the posterior aspect of the anterior abdominal wall to the left cornua and round ligament, which was easily lysed. Normal liver, stomach edge. Normal bladder and + efflux of urine from the bilateral ureteral orifices. Normal ovaries  DESCRIPTION OF PROCEDURE: After informed consent was obtained, the patient was taken to the operating room where anesthesia was obtained without difficulty. The patient was positioned in the dorsal lithotomy position in [place] and her arms were carefully tucked at her sides and the usual precautions were taken.  She was prepped and draped in normal sterile fashion.  Time-out was performed and a Foley catheter was placed into the bladder, and a standard VCare uterine manipulator was then placed in the uterus without incident.   After injection of local anesthesia, the open technique was used to place an supraumbilical (2-4MW above) 10-UV baloon trocar under direct visualization. The laparoscope was introduced  and CO2 gas was infused for pneumoperitoneum to a pressure of 15 mm Hg.  The patient was placed in Trendelenburg and the bowel was displaced up into the upper abdomen.  Right and left lateral 5-mm ports and a 11 mm suprapubic port were placed under direct visualization of the laparoscope.  The bilateral cornua were cauterized and the bilateral round ligaments were cauterized and transected and the bladder flap created. The cornua were then divided and the broad ligaments opened. The uterine arteries were then skeletonized and transected and the decision was made to proceed with an supracervial approach, due to the fibroid and boggy uterus. This was done with the monopolar scissors and the uterus displaced into the upper abdomen. Next, using the monopolar scissors the bladder flap was created and the colpotomy made with the scissors on coag and cut, around the v-care device, and the specimen was removed through the vagina.  A pneumo balloon was placed in the vagina and the vaginal cuff was then closed in a running continuous fashion using the EndoStitch technique with 2-0 V-Lock suture with careful attention to include the vaginal cuff angles and the vaginal mucosa within the closure. The bilateral fallopian tubes were then removed with the Ligasure and removed from the 39mm port.  The cystoscopy was then done with the above noted finidngs. The scope was re-introduced and hemostasis was observed after dropping the pressure to 83mmHg.  The suprapubic trocar was removed and the fascia was closed with 0 Polysorb suture using the carter thomson and the lateral trocars were removed under visualization. The fascia there was closed with 0 Polysorb suture in a figure of eight technique; the suprapubic port was then tied.   The skin incision at the supraumbilical and at the suprapubic  portwas closed with subcuticular stitch of 4-0 monocryl.  The remaining skin incisions were closed with Dermabond glue.  The patient tolerated  the procedure well.  Sponge, lap and needle counts were correct x2.  The patient was taken to recovery room in excellent condition.  Durene Romans MD [facility]  Pager: [phone]

## 2015-05-06 NOTE — Transfer of Care (Signed)
Immediate Anesthesia Transfer of Care Note  Patient: Linda Rhodes  Procedure(s) Performed: Procedure(s): HYSTERECTOMY TOTAL LAPAROSCOPIC (N/A) CYSTOSCOPY (N/A)  Patient Location: PACU  Anesthesia Type:General  Level of Consciousness: awake, alert , oriented and patient cooperative  Airway & Oxygen Therapy: Patient Spontanous Breathing and Patient connected to face mask oxygen  Post-op Assessment: Report given to RN, Post -op Vital signs reviewed and stable and Patient moving all extremities X 4  Post vital signs: Reviewed and stable  Last Vitals:  Filed Vitals:   05/06/15 1148  BP: 141/99  Pulse: 97  Temp: 37.2 C  Resp: 16    Complications: No apparent anesthesia complications

## 2015-05-07 DIAGNOSIS — D251 Intramural leiomyoma of uterus: Secondary | ICD-10-CM | POA: Diagnosis not present

## 2015-05-07 MED ORDER — HYDROCODONE-ACETAMINOPHEN 5-325 MG PO TABS: 1.0000 | ORAL_TABLET | Freq: Four times a day (QID) | ORAL | Status: DC | PRN

## 2015-05-07 MED ORDER — POTASSIUM CHLORIDE CRYS ER 20 MEQ PO TBCR: 40.0000 meq | EXTENDED_RELEASE_TABLET | Freq: Three times a day (TID) | ORAL | Status: DC

## 2015-05-07 MED ORDER — POLYETHYLENE GLYCOL 3350 17 G PO PACK: 17.0000 g | PACK | Freq: Every day | ORAL | Status: DC

## 2015-05-07 MED ORDER — SIMETHICONE 80 MG PO CHEW: 80.0000 mg | CHEWABLE_TABLET | Freq: Two times a day (BID) | ORAL | Status: DC | PRN

## 2015-05-07 MED ORDER — IBUPROFEN 600 MG PO TABS: 600.0000 mg | ORAL_TABLET | Freq: Four times a day (QID) | ORAL | Status: DC | PRN

## 2015-05-07 NOTE — Progress Notes (Signed)
GYN [place] Note  05/07/2015 - 7:10 AM  Brief Clinical History: 39 y.o. POD#1 s/p TLH/BS/cysto (EBL:147mL) for fibroids.   PMHx significant for HTN, tobacco abuse.  Overnight events: one episode of nausea but +voids, ambulation  Subjective: continues to feel better with lessened pain and discomfort and tolerating PO well  Objective:  Current Vital Signs 24h Vital Sign Ranges  T 98.7 F (37.1 C) Temp  Avg: 98.2 F (36.8 C)  Min: 97.5 F (36.4 C)  Max: 98.9 F (37.2 C)  BP 127/85 mmHg BP  Min: 116/81  Max: 154/94  HR 88 Pulse  Avg: 91.3  Min: 69  Max: 118  RR 18 Resp  Avg: 16.8  Min: 12  Max: 22  SaO2 99 % RA SpO2  Avg: 98.7 %  Min: 97 %  Max: 100 %       24 Hour I/O Current Shift I/O  Time Ins Outs 07/19 0701 - 07/20 0700 In: 1900 [I.V.:1900] Out: 1900 [Urine:1750]      General: NAD Abdomen: +BS, soft, NTTP, mildly distended, c/d/i x 4 GU: no gross VB CV: RRR, No MRGs Pulm: CTAB Extremities: no clubbing, cyanosis or edema Neuro: A&O x 3  Medications: Current Facility-Administered Medications  Medication Dose Route Frequency Provider Last Rate Last Dose  . bisacodyl (DULCOLAX) EC tablet 5 mg  5 mg Oral Daily PRN Aletha Halim, MD      . HYDROcodone-acetaminophen (NORCO/VICODIN) 5-325 MG per tablet 1-2 tablet  1-2 tablet Oral Q6H PRN Aletha Halim, MD   2 tablet at 05/07/15 0348  . HYDROmorphone (DILAUDID) injection 0.5 mg  0.5 mg Intravenous Q2H PRN Aletha Halim, MD   0.5 mg at 05/06/15 1836  . ibuprofen (ADVIL,MOTRIN) tablet 600 mg  600 mg Oral Q6H PRN Aletha Halim, MD   600 mg at 05/07/15 0122  . lactated ringers 1,000 mL with potassium chloride 40 mEq/L Pediatric IV infusion   Intravenous Continuous Aletha Halim, MD 100 mL/hr at 05/07/15 0442    . nicotine (NICODERM CQ - dosed in mg/24 hours) patch 14 mg  14 mg Transdermal Daily Aletha Halim, MD   14 mg at 05/06/15 1904  . ondansetron (ZOFRAN) tablet 4 mg  4 mg Oral Q6H PRN Aletha Halim, MD       Or  . ondansetron (ZOFRAN) injection 4 mg  4 mg Intravenous Q6H PRN Aletha Halim, MD   4 mg at 05/06/15 2359  . polyethylene glycol (MIRALAX / GLYCOLAX) packet 17 g  17 g Oral Daily Aletha Halim, MD      . potassium chloride SA (K-DUR,KLOR-CON) CR tablet 40 mEq  40 mEq Oral TID WC Aletha Halim, MD   40 mEq at 05/06/15 2129  . promethazine (PHENERGAN) injection 12.5 mg  12.5 mg Intravenous Q4H PRN Aletha Halim, MD   12.5 mg at 05/07/15 0115  . scopolamine (TRANSDERM-SCOP) 1 MG/3DAYS 1.5 mg  1 patch Transdermal Q72H Aletha Halim, MD      . simethicone (MYLICON) chewable tablet 80 mg  80 mg Oral BID WC Aletha Halim, MD         Recent Labs Lab 05/06/15 [phone]  K 2.9*     A/P: Patient stable with PONV *GYN: routine post op care. Follow up final pathology *FEN/GI: regular diet. Saline lock IVFs. Continue with electrolyte supplementation *PPx: SCDs, OOB ad lib *Tobacco: continue patch *HTN: follow up at post op; if still elevated, refer to PCP *Pain:well controlled *Dispo:  after breakfast *Full Code  Aletha Halim, Brooke Bonito  MD [facility]  Pager: [phone]

## 2015-05-07 NOTE — Discharge Instructions (Addendum)
[facility] Laparoscopic Surgery Discharge Instructions  Instructions Following Major Laparoscopic Surgery You have just undergone a major laparoscopic surgery.  The following list should answer your most common questions.  Although we will discuss your surgery and post-operative instructions with you prior to your discharge, this list will serve as a reminder if you fail to recall the details of what we discussed.  We will discuss your surgery once again in detail at your post-op visit in two to four weeks. If you havent already done so, please call to make your appointment as soon as possible.  How you will feel: Although you have just undergone a major surgery, your recovery will be significantly shorter since the surgery was performed through much smaller incisions than the traditional approach.  You should feel slightly better each day.  If you suddenly feel much worse than the prior day, please call the clinic.  Its important during the early part of your recovery that you maintain some activity.  Walking is encouraged.  You will quicken your recovery by continued activity.  Incision:  Your incisions will be closed with dissolvable stitches or surgical adhesive (glue).  There may be Band-aids and/or Steri-strips covering your incisions.  If there is no drainage from the incisions you may remove the Band-aids in one to two days.  You may notice some minor bruising at the incision sites.  This is common and will resolve within several days.  Please inform us if the redness at the edges of your incision appears to be spreading.  If the skin around your incision becomes warm to the touch, or if you notice a pus-like drainage, please call the office.  Vaginal Discharge Following a Laparoscopic Hysterectomy: Minor vaginal bleeding or spotting is normal following a hysterectomy.  Bleeding similar to the amount of your period is excessive, and you should inform us of this immediately.  Vaginal  spotting may continue for several weeks following your surgery.  You may notice a yellowish discharge which occasionally occurs as the vaginal stitches dissolve, and may last for several weeks.  Sexual Activity Following a Hysterectomy: Do not have sexual intercourse or place tampons or douches in the vagina prior to your first office visit.  We will discuss when you may resume these activities at that visit.    Stairs/Driving/Activities: You may climb stairs if necessary.  If youve had general anesthesia, do not drive a car the rest of the day today.  You may begin light housework when you feel up to it, but avoid heavy lifting or pushing until cleared for these activities by your physician.  Hygiene:  Do not soak your incisions.  Showers are acceptable but you may not take a bath or swim in a pool.  Cleanse your incisions daily with soap and water.  Medications:  Please resume taking any medications that you were taking prior to the surgery.  If we have prescribed any new medications for you, please take them as directed.  Constipation:  It is fairly common to experience some difficulty in moving your bowels following major surgery.  Being active will help to reduce this likelihood. A diet rich in fiber and plenty of liquids is desirable.  If you do become constipated, a mild laxative such as Miralax, Milk of Magnesia, or Metamucil, or a stool softener such as Colace, is recommended.  General Instructions: If you develop a fever of 100.5 degrees or higher, please call the office number(s) below for physician on call.  We will discuss your surgery once again in detail at your post-op visit in two to four weeks. If you havent already done so, please call to make your appointment as soon as possible.  [facility]  [address] [place], [place] [address] [place], [place] [address]  Phone: [phone] Phone: [phone]  Fax: [phone] Fax: [phone]

## 2015-05-08 LAB — SURGICAL PATHOLOGY

## 2015-05-11 NOTE — ED Notes (Signed)
Encounter opened in error.  Sherlene Shams, MD 05/11/15 [phone]

## 2015-07-05 ENCOUNTER — Encounter: Payer: Self-pay | Admitting: *Deleted

## 2015-07-05 ENCOUNTER — Ambulatory Visit
Admission: EM | Admit: 2015-07-05 | Discharge: 2015-07-05 | Disposition: A | Discharge status: Home / Self Care | Payer: BLUE CROSS/BLUE SHIELD | Attending: Family Medicine | Admitting: Family Medicine

## 2015-07-05 DIAGNOSIS — R1030 Lower abdominal pain, unspecified: Secondary | ICD-10-CM | POA: Diagnosis not present

## 2015-07-05 LAB — URINALYSIS COMPLETE WITH MICROSCOPIC (ARMC ONLY)
Bilirubin Urine: NEGATIVE
Glucose, UA: NEGATIVE mg/dL
Hgb urine dipstick: NEGATIVE
Ketones, ur: NEGATIVE mg/dL
Leukocytes, UA: NEGATIVE
Nitrite: NEGATIVE
PH: 6.5 (ref 5.0–8.0)
PROTEIN: NEGATIVE mg/dL
Specific Gravity, Urine: 1.02 (ref 1.005–1.030)

## 2015-07-05 LAB — CBC WITH DIFFERENTIAL/PLATELET
BASOS ABS: 0 10*3/uL (ref 0–0.1)
BASOS PCT: 1 %
Eosinophils Absolute: 0.1 10*3/uL (ref 0–0.7)
Eosinophils Relative: 2 %
HEMATOCRIT: 35.8 % (ref 35.0–47.0)
HEMOGLOBIN: 12.3 g/dL (ref 12.0–16.0)
Lymphocytes Relative: 43 %
Lymphs Abs: 2.1 10*3/uL (ref 1.0–3.6)
MCH: 31.2 pg (ref 26.0–34.0)
MCHC: 34.3 g/dL (ref 32.0–36.0)
MCV: 91.1 fL (ref 80.0–100.0)
Monocytes Absolute: 0.4 10*3/uL (ref 0.2–0.9)
Monocytes Relative: 8 %
NEUTROS ABS: 2.4 10*3/uL (ref 1.4–6.5)
NEUTROS PCT: 46 %
Platelets: 153 10*3/uL (ref 150–440)
RBC: 3.93 MIL/uL (ref 3.80–5.20)
RDW: 13.8 % (ref 11.5–14.5)
WBC: 5 10*3/uL (ref 3.6–11.0)

## 2015-07-05 LAB — COMPREHENSIVE METABOLIC PANEL
ALBUMIN: 4 g/dL (ref 3.5–5.0)
ALK PHOS: 43 U/L (ref 38–126)
ALT: 6 U/L — ABNORMAL LOW (ref 14–54)
AST: 13 U/L — AB (ref 15–41)
Anion gap: 9 (ref 5–15)
BILIRUBIN TOTAL: 0.5 mg/dL (ref 0.3–1.2)
BUN: 7 mg/dL (ref 6–20)
CALCIUM: 8.6 mg/dL — AB (ref 8.9–10.3)
CO2: 25 mmol/L (ref 22–32)
Chloride: 105 mmol/L (ref 101–111)
Creatinine, Ser: 0.84 mg/dL (ref 0.44–1.00)
GFR calc Af Amer: >60 mL/min (ref >60)
GFR calc non Af Amer: >60 mL/min (ref >60)
Glucose, Bld: 98 mg/dL (ref 65–99)
POTASSIUM: 3.3 mmol/L — AB (ref 3.5–5.1)
Sodium: 139 mmol/L (ref 135–145)
TOTAL PROTEIN: 7.2 g/dL (ref 6.5–8.1)

## 2015-07-05 MED ORDER — KETOROLAC TROMETHAMINE 60 MG/2ML IM SOLN
60.0000 mg | Freq: Once | INTRAMUSCULAR | Status: AC
  Administered 2015-07-05: 60 mg via INTRAMUSCULAR

## 2015-07-05 MED ORDER — ONDANSETRON 8 MG PO TBDP
8.0000 mg | ORAL_TABLET | Freq: Once | ORAL | Status: AC
  Administered 2015-07-05: 8 mg via ORAL

## 2015-07-05 MED ORDER — KETOROLAC TROMETHAMINE 10 MG PO TABS: 10.0000 mg | ORAL_TABLET | Freq: Three times a day (TID) | ORAL | Status: DC | PRN

## 2015-07-05 MED ORDER — ONDANSETRON 8 MG PO TBDP: 8.0000 mg | ORAL_TABLET | Freq: Two times a day (BID) | ORAL | Status: DC

## 2015-07-05 NOTE — ED Provider Notes (Signed)
CSN: [id number]     Arrival date & time 07/05/15  1014 History   None    Chief Complaint  Patient presents with  . Abdominal Pain   (Consider location/radiation/quality/duration/timing/severity/associated sxs/prior Treatment) HPI Comments: 39 yo female with lower abdominal pains since yesterday, that feel like menstrual cramps.  Patient states she had similar symptoms in the past and was found to have a large uterine fibroid for which she underwent a hysterectomy in July. States had been doing well since then. Denies any vomiting, fevers, chills, dysuria, melena, hematochezia, hematuria, vaginal discharge.  The history is provided by the patient.    Past Medical History  Diagnosis Date  . Bladder infection   . Ovarian cyst   . Anemia   . GERD (gastroesophageal reflux disease)    Past Surgical History  Procedure Laterality Date  . Cholecystectomy    . Cesarean section    . Back surgery    . Laparoscopic hysterectomy N/A 05/06/2015    Procedure: HYSTERECTOMY TOTAL LAPAROSCOPIC;  Surgeon: Aletha Halim, MD;  Location: [facility] ORS;  Service: Gynecology;  Laterality: N/A;  . Cystoscopy N/A 05/06/2015    Procedure: CYSTOSCOPY;  Surgeon: Aletha Halim, MD;  Location: [facility] ORS;  Service: Gynecology;  Laterality: N/A;   Family History  Problem Relation Age of Onset  . Diabetes Mother   . Hypertension Mother   . Diabetes Father   . Hypertension Father    Social History  Substance Use Topics  . Smoking status: Current Every Day Smoker -- 0.50 packs/day for 7 years    Types: Cigarettes  . Smokeless tobacco: None  . Alcohol Use: Yes     Comment: socially   OB History    No data available     Review of Systems  Allergies  Review of patient's allergies indicates no known allergies.  Home Medications   Prior to Admission medications   Medication Sig Start Date End Date Taking? Authorizing Provider  acetaminophen (TYLENOL) 500 MG tablet Take 1,000 mg by mouth every 8 (eight)  hours as needed.   Yes Historical Provider, MD  HYDROcodone-acetaminophen (NORCO/VICODIN) 5-325 MG per tablet Take 2 tablets by mouth every 4 (four) hours as needed. Patient taking differently: Take 2 tablets by mouth every 4 (four) hours as needed for moderate pain.  03/26/15   Sherlene Shams, MD  HYDROcodone-acetaminophen (NORCO/VICODIN) 5-325 MG per tablet Take 1 tablet by mouth every 6 (six) hours as needed for moderate pain. 05/07/15   Aletha Halim, MD  ibuprofen (ADVIL,MOTRIN) 200 MG tablet Take 200 mg by mouth every 6 (six) hours as needed for mild pain.     Historical Provider, MD  ibuprofen (ADVIL,MOTRIN) 600 MG tablet Take 1 tablet (600 mg total) by mouth every 6 (six) hours as needed for fever, headache or mild pain. 05/07/15   Aletha Halim, MD  ketorolac (TORADOL) 10 MG tablet Take 1 tablet (10 mg total) by mouth every 8 (eight) hours as needed. 07/05/15   Norval Gable, MD  naproxen (NAPROSYN) 500 MG tablet Take 1 tablet (500 mg total) by mouth 2 (two) times daily. 03/26/15   Sherlene Shams, MD  ondansetron (ZOFRAN ODT) 8 MG disintegrating tablet Take 1 tablet (8 mg total) by mouth 2 (two) times daily. 07/05/15   Norval Gable, MD  polyethylene glycol (MIRALAX / GLYCOLAX) packet Take 17 g by mouth daily. 05/07/15   Aletha Halim, MD  potassium chloride SA (K-DUR,KLOR-CON) 20 MEQ tablet Take 2 tablets (40 mEq total) by mouth  3 (three) times daily with meals. 05/07/15   Aletha Halim, MD  simethicone (MYLICON) 80 MG chewable tablet Chew 1 tablet (80 mg total) by mouth 2 (two) times daily as needed for flatulence. 05/07/15   Aletha Halim, MD   Meds Ordered and Administered this Visit   Medications  ondansetron (ZOFRAN-ODT) disintegrating tablet 8 mg (8 mg Oral Given 07/05/15 1206)  ketorolac (TORADOL) injection 60 mg (60 mg Intramuscular Given 07/05/15 1206)    BP 136/103 mmHg  Pulse 69  Temp(Src) 98.4 F (36.9 C) (Oral)  Ht 5\' 8"  (1.727 m)  Wt 140 lb (63.504 kg)  BMI 21.29 kg/m2   SpO2 99%  LMP  (LMP Unknown) No data found.   Physical Exam  Constitutional: She appears well-developed and well-nourished. No distress.  Cardiovascular: Normal rate.   Pulmonary/Chest: Effort normal. No respiratory distress.  Abdominal: Soft. Bowel sounds are normal. She exhibits no distension and no mass. There is tenderness (mild bilateral lower abdominal tenderness to palpation; no rebound or guarding). There is no rebound and no guarding.  Skin: She is not diaphoretic.  Nursing note and vitals reviewed.   ED Course  Procedures (including critical care time)  Labs Review Labs Reviewed  URINALYSIS COMPLETEWITH MICROSCOPIC ([facility] ONLY) - Abnormal; Notable for the following:    Bacteria, UA FEW (*)    Squamous Epithelial / LPF 6-30 (*)    All other components within normal limits  COMPREHENSIVE METABOLIC PANEL - Abnormal; Notable for the following:    Potassium 3.3 (*)    Calcium 8.6 (*)    AST 13 (*)    ALT 6 (*)    All other components within normal limits  CBC WITH DIFFERENTIAL/PLATELET    Imaging Review No results found.   Visual Acuity Review  Right Eye Distance:   Left Eye Distance:   Bilateral Distance:    Right Eye Near:   Left Eye Near:    Bilateral Near:         MDM   1. Lower abdominal pain    Discharge Medication List as of 07/05/2015  1:33 PM    START taking these medications   Details  ketorolac (TORADOL) 10 MG tablet Take 1 tablet (10 mg total) by mouth every 8 (eight) hours as needed., Starting 07/05/2015, Until Discontinued, Normal    ondansetron (ZOFRAN ODT) 8 MG disintegrating tablet Take 1 tablet (8 mg total) by mouth 2 (two) times daily., Starting 07/05/2015, Until Discontinued, Normal       Plan: 1. Test results (negative/normal) and diagnosis reviewed with patient 2. rx as per orders; risks, benefits, potential side effects reviewed with patient 3. F/u prn if symptoms worsen or don't improve   Norval Gable, MD 07/05/15  1635

## 2015-07-05 NOTE — ED Notes (Signed)
Pt states she had a historctomy on 05/06/15 because of stabbing like abdominal pain and nausea, symptoms are now back.

## 2015-08-21 ENCOUNTER — Encounter: Payer: Self-pay | Admitting: Emergency Medicine

## 2015-08-21 ENCOUNTER — Ambulatory Visit
Admission: EM | Admit: 2015-08-21 | Discharge: 2015-08-21 | Disposition: A | Discharge status: Home / Self Care | Payer: BLUE CROSS/BLUE SHIELD | Attending: Family Medicine | Admitting: Family Medicine

## 2015-08-21 DIAGNOSIS — B0229 Other postherpetic nervous system involvement: Secondary | ICD-10-CM

## 2015-08-21 DIAGNOSIS — R030 Elevated blood-pressure reading, without diagnosis of hypertension: Secondary | ICD-10-CM

## 2015-08-21 DIAGNOSIS — B029 Zoster without complications: Secondary | ICD-10-CM

## 2015-08-21 DIAGNOSIS — IMO0001 Reserved for inherently not codable concepts without codable children: Secondary | ICD-10-CM

## 2015-08-21 MED ORDER — LIDOCAINE 5 % EX PTCH: 1.0000 | MEDICATED_PATCH | CUTANEOUS | Status: DC

## 2015-08-21 MED ORDER — VALACYCLOVIR HCL 1 G PO TABS: 1000.0000 mg | ORAL_TABLET | Freq: Three times a day (TID) | ORAL | Status: DC

## 2015-08-21 MED ORDER — TRAMADOL HCL 50 MG PO TABS: 50.0000 mg | ORAL_TABLET | Freq: Four times a day (QID) | ORAL | Status: DC | PRN

## 2015-08-21 NOTE — ED Notes (Signed)
Pt with a painful rash on chest

## 2015-08-21 NOTE — ED Provider Notes (Signed)
CSN: [id number]     Arrival date & time 08/21/15  1326 History   First MD Initiated Contact with Patient 08/21/15 1439    Nurses notes were reviewed. Chief Complaint  Patient presents with  . Rash   patient was having a rash for a week now. She states the rash pain is getting worse. Years ago she had a right just put on prednisone and pain medication. Unfortunately she did not seek medical attention was fractures. (Consider location/radiation/quality/duration/timing/severity/associated sxs/prior Treatment) Patient is a 39 y.o. female presenting with rash. The history is provided by the patient. No language interpreter was used.  Rash Location:  Torso Torso rash location:  L chest Quality: burning, dryness, painful and redness   Pain details:    Quality:  Aching, throbbing, itching, stinging and sharp   Severity:  Moderate   Duration:  1 week   Timing:  Constant   Progression:  Worsening Severity:  Moderate Onset quality:  Sudden Duration:  1 week Timing:  Constant Progression:  Unchanged Chronicity:  New Context: not animal contact, not eggs, not exposure to similar rash, not hot tub use, not insect bite/sting, not medications, not plant contact, not pollen and not sick contacts   Relieved by:  Nothing Ineffective treatments:  None tried Associated symptoms: URI   Associated symptoms: no abdominal pain, no diarrhea, no fatigue and no fever     Past Medical History  Diagnosis Date  . Bladder infection   . Ovarian cyst   . Anemia   . GERD (gastroesophageal reflux disease)    Past Surgical History  Procedure Laterality Date  . Cholecystectomy    . Cesarean section    . Back surgery    . Laparoscopic hysterectomy N/A 05/06/2015    Procedure: HYSTERECTOMY TOTAL LAPAROSCOPIC;  Surgeon: Aletha Halim, MD;  Location: [facility]S;  Service: Gynecology;  Laterality: N/A;  . Cystoscopy N/A 05/06/2015    Procedure: CYSTOSCOPY;  Surgeon: Aletha Halim, MD;  Location: [facility] ORS;   Service: Gynecology;  Laterality: N/A;   Family History  Problem Relation Age of Onset  . Diabetes Mother   . Hypertension Mother   . Diabetes Father   . Hypertension Father    Social History  Substance Use Topics  . Smoking status: Current Every Day Smoker -- 0.50 packs/day for 7 years    Types: Cigarettes  . Smokeless tobacco: None  . Alcohol Use: Yes     Comment: socially   OB History    No data available     Review of Systems  Constitutional: Negative for fever and fatigue.  Gastrointestinal: Negative for abdominal pain and diarrhea.  Skin: Positive for rash.  All other systems reviewed and are negative.   Allergies  Review of patient's allergies indicates no known allergies.  Home Medications   Prior to Admission medications   Medication Sig Start Date End Date Taking? Authorizing Provider  acetaminophen (TYLENOL) 500 MG tablet Take 1,000 mg by mouth every 8 (eight) hours as needed.    Historical Provider, MD  HYDROcodone-acetaminophen (NORCO/VICODIN) 5-325 MG per tablet Take 2 tablets by mouth every 4 (four) hours as needed. Patient taking differently: Take 2 tablets by mouth every 4 (four) hours as needed for moderate pain.  03/26/15   Sherlene Shams, MD  HYDROcodone-acetaminophen (NORCO/VICODIN) 5-325 MG per tablet Take 1 tablet by mouth every 6 (six) hours as needed for moderate pain. 05/07/15   Aletha Halim, MD  ibuprofen (ADVIL,MOTRIN) 200 MG tablet Take 200 mg by  mouth every 6 (six) hours as needed for mild pain.     Historical Provider, MD  ibuprofen (ADVIL,MOTRIN) 600 MG tablet Take 1 tablet (600 mg total) by mouth every 6 (six) hours as needed for fever, headache or mild pain. 05/07/15   Aletha Halim, MD  ketorolac (TORADOL) 10 MG tablet Take 1 tablet (10 mg total) by mouth every 8 (eight) hours as needed. 07/05/15   Norval Gable, MD  lidocaine (LIDODERM) 5 % Place 1 patch onto the skin daily. Remove & Discard patch within 12 hours or as directed by MD. HM  patch to correspond to the area needed for pain. 08/21/15   Frederich Cha, MD  naproxen (NAPROSYN) 500 MG tablet Take 1 tablet (500 mg total) by mouth 2 (two) times daily. 03/26/15   Sherlene Shams, MD  ondansetron (ZOFRAN ODT) 8 MG disintegrating tablet Take 1 tablet (8 mg total) by mouth 2 (two) times daily. 07/05/15   Norval Gable, MD  polyethylene glycol (MIRALAX / GLYCOLAX) packet Take 17 g by mouth daily. 05/07/15   Aletha Halim, MD  potassium chloride SA (K-DUR,KLOR-CON) 20 MEQ tablet Take 2 tablets (40 mEq total) by mouth 3 (three) times daily with meals. 05/07/15   Aletha Halim, MD  simethicone (MYLICON) 80 MG chewable tablet Chew 1 tablet (80 mg total) by mouth 2 (two) times daily as needed for flatulence. 05/07/15   Aletha Halim, MD  traMADol (ULTRAM) 50 MG tablet Take 1 tablet (50 mg total) by mouth every 6 (six) hours as needed. 08/21/15   Frederich Cha, MD  valACYclovir (VALTREX) 1000 MG tablet Take 1 tablet (1,000 mg total) by mouth 3 (three) times daily. 08/21/15   Frederich Cha, MD   Meds Ordered and Administered this Visit  Medications - No data to display  BP 154/102 mmHg  Pulse 70  Temp(Src) 98.2 F (36.8 C) (Tympanic)  Resp 20  Ht 5' 7.5" (1.715 m)  Wt 155 lb (70.308 kg)  BMI 23.90 kg/m2  SpO2 99%  LMP  (LMP Unknown) No data found.   Physical Exam  Constitutional: She is oriented to person, place, and time. She appears well-developed and well-nourished.  HENT:  Head: Normocephalic and atraumatic.  Eyes: Pupils are equal, round, and reactive to light.  Neck: Normal range of motion.  Musculoskeletal: Normal range of motion.  Neurological: She is alert and oriented to person, place, and time.  Skin: Rash noted. There is erythema.  Psychiatric: She has a normal mood and affect. Her behavior is normal.  Vitals reviewed.   ED Course  Procedures (including critical care time)  Labs Review Labs Reviewed - No data to display  Imaging Review No results  found.   Visual Acuity Review  Right Eye Distance:   Left Eye Distance:   Bilateral Distance:    Right Eye Near:   Left Eye Near:    Bilateral Near:         MDM   1. Shingles outbreak   2. Neuralgia, post-herpetic   3. 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 We'll place her on Valtrex not sure the Valtrex will make a big difference. We'll place her on Lidoderm patch and also tramadol for pain. Stressed importance of establishing a PCP patient's blood pressures elevated and follow-up with them as well.   Frederich Cha, MD 08/21/15 [phone]

## 2015-08-21 NOTE — Discharge Instructions (Signed)
Postherpetic Neuralgia Postherpetic neuralgia (PHN) is nerve pain that occurs after a shingles infection. Shingles is a painful rash that appears on one side of the body, usually on your trunk or face. Shingles is caused by the varicella-zoster virus. This is the same virus that causes chickenpox. In people who have had chickenpox, the virus can resurface years later and cause shingles. You may have PHN if you continue to have pain for 3 months after your shingles rash has gone away. PHN appears in the same area where you had the shingles rash. For most people, PHN goes away within 1 year.  Getting a vaccination for shingles can prevent PHN. This vaccine is recommended for people older than 50. It may prevent shingles and may also lower your risk of PHN if you do get shingles. CAUSES PHN is caused by damage to your nerves from the varicella-zoster virus. This damage makes your nerves overly sensitive.  RISK FACTORS Aging is the biggest risk factor for developing PHN. Most people who get PHN are older than 44. Other risk factors include:  Having very bad pain before your shingles rash starts.  Having a very bad rash.  Having shingles in the nerve that supplies your face and eye (trigeminal nerve). SIGNS AND SYMPTOMS Pain is the main symptom of PHN. The pain is often very bad and may be described as stabbing, burning, or feeling like an electric shock. The pain may come and go or may be there all the time. Pain may be triggered by light touches on the skin or changes in temperature. You may have itching along with the pain. DIAGNOSIS  Your health care provider may diagnose PHN based on your symptoms and your history of shingles. Lab studies and other diagnostic tests are usually not needed. TREATMENT  There is no cure for PHN. Treatment for PHN will focus on pain relief. Over-the-counter pain relievers do not usually relieve PHN pain. You may need to work with a pain specialist. Treatment may  include:  Antidepressant medicines to help with pain and improve sleep.  Antiseizure medicines to relieve nerve pain.  Strong pain relievers (opioids).  A numbing patch worn on the skin (lidocaine patch). HOME CARE INSTRUCTIONS It may take a long time to recover from PHN. Work closely with your health care provider, and have a good support system at home.   Take all medicines as directed by your health care provider.  Wear loose, comfortable clothing.  Cover sensitive areas with a dressing to reduce friction from clothing rubbing on the area.  If cold does not make your pain worse, try applying a cool compress or cooling gel pack to the area.  Talk to your health care provider if you feel depressed or desperate. Living with long-term pain can be depressing. SEEK MEDICAL CARE IF:  Your medicine is not helping.  You are struggling to manage your pain at home.   This information is not intended to replace advice given to you by your health care provider. Make sure you discuss any questions you have with your health care provider.   Document Released: 12/25/2002 Document Revised: 10/25/2014 Document Reviewed: 09/25/2013 Elsevier Interactive Patient Education 2016 [place] is an infection that causes a painful skin rash and fluid-filled blisters. Shingles is caused by the same virus that causes chickenpox. Shingles only develops in people who:  Have had chickenpox.  Have gotten the chickenpox vaccine. (This is rare.) The first symptoms of shingles may be itching, tingling,  or pain in an area on your skin. A rash will follow in a few days or weeks. The rash is usually on one side of the body in a bandlike or beltlike pattern. Over time, the rash turns into fluid-filled blisters that break open, scab over, and dry up. Medicines may:  Help you manage pain.  Help you recover more quickly.  Help to prevent long-term problems. HOME CARE Medicines  Take  medicines only as told by your doctor.  Apply an anti-itch or numbing cream to the affected area as told by your doctor. Blister and Rash Care  Take a cool bath or put cool compresses on the area of the rash or blisters as told by your doctor. This may help with pain and itching.  Keep your rash covered with a loose bandage (dressing). Wear loose-fitting clothing.  Keep your rash and blisters clean with mild soap and cool water or as told by your doctor.  Check your rash every day for signs of infection. These include redness, swelling, and pain that lasts or gets worse.  Do not pick your blisters.  Do not scratch your rash. General Instructions  Rest as told by your doctor.  Keep all follow-up visits as told by your doctor. This is important.  Until your blisters scab over, your infection can cause chickenpox in people who have never had it or been vaccinated against it. To prevent this from happening, avoid touching other people or being around other people, especially:  Babies.  Pregnant women.  Children who have eczema.  Elderly people who have transplants.  People who have chronic illnesses, such as leukemia or AIDS. GET HELP IF:  Your pain does not get better with medicine.  Your pain does not get better after the rash heals.  Your rash looks infected. Signs of infection include:  Redness.  Swelling.  Pain that lasts or gets worse. GET HELP RIGHT AWAY IF:  The rash is on your face or nose.  You have pain in your face, pain around your eye area, or loss of feeling on one side of your face.  You have ear pain or you have ringing in your ear.  You have loss of taste.  Your condition gets worse.   This information is not intended to replace advice given to you by your health care provider. Make sure you discuss any questions you have with your health care provider.   Document Released: 03/22/2008 Document Revised: 10/25/2014 Document Reviewed:  07/16/2014 Elsevier Interactive Patient Education [employer].

## 2015-09-01 ENCOUNTER — Encounter: Payer: Self-pay | Admitting: Family Medicine

## 2015-09-01 ENCOUNTER — Ambulatory Visit (INDEPENDENT_AMBULATORY_CARE_PROVIDER_SITE_OTHER): Payer: BLUE CROSS/BLUE SHIELD | Admitting: Family Medicine

## 2015-09-01 VITALS — BP 124/100 | HR 78 | Ht 67.0 in | Wt 154.0 lb

## 2015-09-01 DIAGNOSIS — F17210 Nicotine dependence, cigarettes, uncomplicated: Secondary | ICD-10-CM | POA: Diagnosis not present

## 2015-09-01 DIAGNOSIS — Z7689 Persons encountering health services in other specified circumstances: Secondary | ICD-10-CM

## 2015-09-01 DIAGNOSIS — IMO0001 Reserved for inherently not codable concepts without codable children: Secondary | ICD-10-CM

## 2015-09-01 DIAGNOSIS — Z7189 Other specified counseling: Secondary | ICD-10-CM | POA: Diagnosis not present

## 2015-09-01 DIAGNOSIS — R079 Chest pain, unspecified: Secondary | ICD-10-CM | POA: Diagnosis not present

## 2015-09-01 DIAGNOSIS — B0229 Other postherpetic nervous system involvement: Secondary | ICD-10-CM | POA: Diagnosis not present

## 2015-09-01 DIAGNOSIS — R030 Elevated blood-pressure reading, without diagnosis of hypertension: Secondary | ICD-10-CM

## 2015-09-01 MED ORDER — TRAMADOL HCL 50 MG PO TABS: 50.0000 mg | ORAL_TABLET | Freq: Three times a day (TID) | ORAL | Status: DC | PRN

## 2015-09-01 MED ORDER — NICOTINE 21 MG/24HR TD PT24: 21.0000 mg | MEDICATED_PATCH | Freq: Every day | TRANSDERMAL | Status: DC

## 2015-09-01 NOTE — Patient Instructions (Signed)
Postherpetic Neuralgia   Postherpetic neuralgia (PHN) is nerve pain that occurs after a shingles infection. Shingles is a painful rash that appears on one side of the body, usually on your trunk or face. Shingles is caused by the varicella-zoster virus. This is the same virus that causes chickenpox. In people who have had chickenpox, the virus can resurface years later and cause shingles.   You may have PHN if you continue to have pain for 3 months after your shingles rash has gone away. PHN appears in the same area where you had the shingles rash. For most people, PHN goes away within 1 year.   Getting a vaccination for shingles can prevent PHN. This vaccine is recommended for people older than 50. It may prevent shingles and may also lower your risk of PHN if you do get shingles.   CAUSES   PHN is caused by damage to your nerves from the varicella-zoster virus. This damage makes your nerves overly sensitive.   RISK FACTORS   Aging is the biggest risk factor for developing PHN. Most people who get PHN are older than 60. Other risk factors include:   Having very bad pain before your shingles rash starts.   Having a very bad rash.   Having shingles in the nerve that supplies your face and eye (trigeminal nerve).  SIGNS AND SYMPTOMS   Pain is the main symptom of PHN. The pain is often very bad and may be described as stabbing, burning, or feeling like an electric shock. The pain may come and go or may be there all the time. Pain may be triggered by light touches on the skin or changes in temperature. You may have itching along with the pain.   DIAGNOSIS   Your health care provider may diagnose PHN based on your symptoms and your history of shingles. Lab studies and other diagnostic tests are usually not needed.   TREATMENT   There is no cure for PHN. Treatment for PHN will focus on pain relief. Over-the-counter pain relievers do not usually relieve PHN pain. You may need to work with a pain specialist. Treatment may  include:   Antidepressant medicines to help with pain and improve sleep.   Antiseizure medicines to relieve nerve pain.   Strong pain relievers (opioids).   A numbing patch worn on the skin (lidocaine patch).  HOME CARE INSTRUCTIONS   It may take a long time to recover from PHN. Work closely with your health care provider, and have a good support system at home.   Take all medicines as directed by your health care provider.   Wear loose, comfortable clothing.   Cover sensitive areas with a dressing to reduce friction from clothing rubbing on the area.   If cold does not make your pain worse, try applying a cool compress or cooling gel pack to the area.   Talk to your health care provider if you feel depressed or desperate. Living with long-term pain can be depressing.  SEEK MEDICAL CARE IF:   Your medicine is not helping.   You are struggling to manage your pain at home.  This information is not intended to replace advice given to you by your health care provider. Make sure you discuss any questions you have with your health care provider.   Document Released: 12/25/2002 Document Revised: 10/25/2014 Document Reviewed: 09/25/2013   Elsevier Interactive Patient Education 2016 Elsevier Inc[employer]

## 2015-09-01 NOTE — Progress Notes (Signed)
Name: Linda Rhodes   MRN: [id number]    DOB: [birth year]   Date:09/01/2015       Progress Note  Subjective  Chief Complaint  Chief Complaint  Patient presents with  . Establish Care    follow up from urgent care  . Herpes Zoster    had round of Acyclovir and Tramadol    Rash This is a chronic problem. The current episode started 1 to 4 weeks ago. The problem is unchanged (earlier vesicle/now resolving). The affected locations include the chest. Rash characteristics: maculae / slight redness. Associated symptoms include coughing and rhinorrhea. Pertinent negatives include no anorexia, congestion, diarrhea, eye pain, facial edema, fatigue, fever, joint pain, shortness of breath, sore throat or vomiting. Past treatments include analgesics and oral steroids. The treatment provided mild relief.    No problem-specific assessment & plan notes found for this encounter.   Past Medical History  Diagnosis Date  . Bladder infection   . Ovarian cyst   . Anemia   . GERD (gastroesophageal reflux disease)     Past Surgical History  Procedure Laterality Date  . Cholecystectomy    . Cesarean section    . Back surgery    . Laparoscopic hysterectomy N/A 05/06/2015    Procedure: HYSTERECTOMY TOTAL LAPAROSCOPIC;  Surgeon: Aletha Halim, MD;  Location: [facility] OR[facility];  Service: Gynecology;  Laterality: N/A;  . Cystoscopy N/A 05/06/2015    Procedure: CYSTOSCOPY;  Surgeon: Aletha Halim, MD;  Location: [facility] OR[facility];  Service: Gynecology;  Laterality: N/A;    Family History  Problem Relation Age of Onset  . Diabetes Mother   . Hypertension Mother   . Diabetes Father   . Hypertension Father     Social History   Social History  . Marital Status: Single    Spouse Name: N/A  . Number of Children: N/A  . Years of Education: N/A   Occupational History  . Not on file.   Social History Main Topics  . Smoking status: Current Every Day Smoker -- 0.50 packs/day for 7 years    Types: Cigarettes   . Smokeless tobacco: Not on file  . Alcohol Use: Yes     Comment: socially  . Drug Use: No  . Sexual Activity: Not on file   Other Topics Concern  . Not on file   Social History Narrative    No Known Allergies   Review of Systems  Constitutional: Negative for fever, chills, weight loss, malaise/fatigue and fatigue.  HENT: Positive for rhinorrhea. Negative for congestion, ear discharge, ear pain and sore throat.   Eyes: Negative for blurred vision and pain.  Respiratory: Positive for cough. Negative for sputum production, shortness of breath and wheezing.   Cardiovascular: Negative for chest pain, palpitations and leg swelling.  Gastrointestinal: Negative for heartburn, nausea, vomiting, abdominal pain, diarrhea, constipation, blood in stool, melena and anorexia.  Genitourinary: Negative for dysuria, urgency, frequency and hematuria.  Musculoskeletal: Negative for myalgias, back pain, joint pain and neck pain.  Skin: Positive for rash.  Neurological: Negative for dizziness, tingling, sensory change, focal weakness and headaches.  Endo/Heme/Allergies: Negative for environmental allergies and polydipsia. Does not bruise/bleed easily.  Psychiatric/Behavioral: Negative for depression and suicidal ideas. The patient is not nervous/anxious and does not have insomnia.      Objective  Filed Vitals:   09/01/15 1445  BP: 124/100  Pulse: 78  Height: 5\' 7"  (1.702 m)  Weight: 154 lb (69.854 kg)    Physical Exam  Constitutional: She is well-developed,  well-nourished, and in no distress. No distress.  HENT:  Head: Normocephalic and atraumatic.  Right Ear: External ear normal.  Left Ear: External ear normal.  Nose: Nose normal.  Mouth/Throat: Oropharynx is clear and moist.  Eyes: Conjunctivae and EOM are normal. Pupils are equal, round, and reactive to light. Right eye exhibits no discharge. Left eye exhibits no discharge.  Neck: Normal range of motion. Neck supple. No JVD present.  No thyromegaly present.  Cardiovascular: Normal rate, regular rhythm, normal heart sounds and intact distal pulses.  Exam reveals no gallop and no friction rub.   No murmur heard. Pulmonary/Chest: Effort normal and breath sounds normal.  Abdominal: Soft. Bowel sounds are normal. She exhibits no mass. There is no tenderness. There is no guarding.  Musculoskeletal: Normal range of motion. She exhibits no edema or tenderness.  Lymphadenopathy:    She has no cervical adenopathy.  Neurological: She is alert. She has normal reflexes.  Skin: Skin is warm and dry. Rash noted. Rash is macular. She is not diaphoretic.  Psychiatric: Mood and affect normal.  Nursing note and vitals reviewed.     Assessment & Plan  Problem List Items Addressed This Visit    None    Visit Diagnoses    Postherpetic neuralgia    -  Primary    Relevant Medications    traMADol (ULTRAM) 50 MG tablet    nicotine (NICODERM CQ) 21 mg/24hr patch    Cigarette nicotine dependence without complication        Relevant Medications    nicotine (NICODERM CQ) 21 mg/24hr patch    Elevated blood pressure        Encounter to establish care with new doctor        Chest pain at rest        Relevant Orders    EKG 12-Lead (Completed)        Dr. Macon [facility]  09/01/2015

## 2015-09-26 ENCOUNTER — Other Ambulatory Visit: Payer: Self-pay | Admitting: Family Medicine

## 2015-10-26 ENCOUNTER — Encounter: Payer: Self-pay | Admitting: *Deleted

## 2015-10-26 ENCOUNTER — Ambulatory Visit
Admission: EM | Admit: 2015-10-26 | Discharge: 2015-10-26 | Disposition: A | Discharge status: Home / Self Care | Payer: BLUE CROSS/BLUE SHIELD | Attending: Family Medicine | Admitting: Family Medicine

## 2015-10-26 DIAGNOSIS — R112 Nausea with vomiting, unspecified: Secondary | ICD-10-CM

## 2015-10-26 DIAGNOSIS — R519 Headache, unspecified: Secondary | ICD-10-CM

## 2015-10-26 DIAGNOSIS — R51 Headache: Secondary | ICD-10-CM | POA: Diagnosis not present

## 2015-10-26 DIAGNOSIS — K122 Cellulitis and abscess of mouth: Secondary | ICD-10-CM | POA: Diagnosis not present

## 2015-10-26 MED ORDER — ONDANSETRON 8 MG PO TBDP: 8.0000 mg | ORAL_TABLET | Freq: Three times a day (TID) | ORAL | Status: DC | PRN

## 2015-10-26 MED ORDER — KETOROLAC TROMETHAMINE 60 MG/2ML IM SOLN
60.0000 mg | Freq: Once | INTRAMUSCULAR | Status: AC
  Administered 2015-10-26: 60 mg via INTRAMUSCULAR

## 2015-10-26 MED ORDER — MELOXICAM 15 MG PO TABS: 15.0000 mg | ORAL_TABLET | Freq: Every day | ORAL | Status: DC

## 2015-10-26 MED ORDER — ONDANSETRON 8 MG PO TBDP
8.0000 mg | ORAL_TABLET | Freq: Once | ORAL | Status: AC
  Administered 2015-10-26: 8 mg via ORAL

## 2015-10-26 MED ORDER — AMOXICILLIN-POT CLAVULANATE 875-125 MG PO TABS: 1.0000 | ORAL_TABLET | Freq: Two times a day (BID) | ORAL | Status: DC

## 2015-10-26 MED ORDER — ONDANSETRON HCL 4 MG/2ML IJ SOLN: 8.0000 mg | Freq: Once | INTRAMUSCULAR | Status: DC

## 2015-10-26 NOTE — ED Notes (Signed)
Pt states that she had dental surgery on Tuesday, 3 days later right side of face started hurting, headache, has been vomiting, chills, ear pain

## 2015-10-26 NOTE — Discharge Instructions (Signed)
Gingivitis  Gingivitis is an infection of the teeth and bones that support the teeth. Your gums become red, sore, and puffy (swollen). It is caused by germs that build up on your teeth and gums (plaque). HOME CARE  Floss and then brush your teeth.  Brush at least twice a day.  Floss at least once a day.  Avoid sugar between meals.  Do not drink juice before bed. Only drink water.  Make and keep your regular checkups and cleanings with your dentist.  Use any mouth care product or toothpaste as told by your dentist. GET HELP RIGHT AWAY IF:  You have painful, red tissue around your teeth.  You have trouble chewing.  You have loose or infected teeth. MAKE SURE YOU:  Understand these instructions.  Will watch your condition.  Will get help right away if you are not doing well or get worse.   This information is not intended to replace advice given to you by your health care provider. Make sure you discuss any questions you have with your health care provider.   Document Released: 11/06/2010 Document Revised: 12/27/2011 Document Reviewed: 05/19/2015 Elsevier Interactive Patient Education 2016 Elsevier Inc.  Nausea and Vomiting Nausea means you feel sick to your stomach. Throwing up (vomiting) is a reflex where stomach contents come out of your mouth. HOME CARE   Take medicine as told by your doctor.  Do not force yourself to eat. However, you do need to drink fluids.  If you feel like eating, eat a normal diet as told by your doctor.  Eat rice, wheat, potatoes, bread, lean meats, yogurt, fruits, and vegetables.  Avoid high-fat foods.  Drink enough fluids to keep your pee (urine) clear or pale yellow.  Ask your doctor how to replace body fluid losses (rehydrate). Signs of body fluid loss (dehydration) include:  Feeling very thirsty.  Dry lips and mouth.  Feeling dizzy.  Dark pee.  Peeing less than normal.  Feeling confused.  Fast breathing or heart  rate. GET HELP RIGHT AWAY IF:   You have blood in your throw up.  You have black or bloody poop (stool).  You have a bad headache or stiff neck.  You feel confused.  You have bad belly (abdominal) pain.  You have chest pain or trouble breathing.  You do not pee at least once every 8 hours.  You have cold, clammy skin.  You keep throwing up after 24 to 48 hours.  You have a fever. MAKE SURE YOU:   Understand these instructions.  Will watch your condition.  Will get help right away if you are not doing well or get worse.   This information is not intended to replace advice given to you by your health care provider. Make sure you discuss any questions you have with your health care provider.   Document Released: 03/22/2008 Document Revised: 12/27/2011 Document Reviewed: 03/05/2011 Elsevier Interactive Patient Education 2016 Elsevier Inc[employer]  Abscess An abscess (boil or furuncle) is an infected area on or under the skin. This area is filled with yellowish-white fluid (pus) and other material (debris). HOME CARE   Only take medicines as told by your doctor.  If you were given antibiotic medicine, take it as directed. Finish the medicine even if you start to feel better.  If gauze is used, follow your doctor's directions for changing the gauze.  To avoid spreading the infection:  Keep your abscess covered with a bandage.  Wash your hands well.  Do not  share personal care items, towels, or whirlpools with others.  Avoid skin contact with others.  Keep your skin and clothes clean around the abscess.  Keep all doctor visits as told. GET HELP RIGHT AWAY IF:   You have more pain, puffiness (swelling), or redness in the wound site.  You have more fluid or blood coming from the wound site.  You have muscle aches, chills, or you feel sick.  You have a fever. MAKE SURE YOU:   Understand these instructions.  Will watch your condition.  Will get help right away  if you are not doing well or get worse.   This information is not intended to replace advice given to you by your health care provider. Make sure you discuss any questions you have with your health care provider.   Document Released: 03/22/2008 Document Revised: 04/04/2012 Document Reviewed: 12/18/2011 Elsevier Interactive Patient Education [employer].

## 2015-10-26 NOTE — ED Provider Notes (Signed)
CSN: [id number]     Arrival date & time 10/26/15  1004 History   First MD Initiated Contact with Patient 10/26/15 1029    Nurses notes were reviewed. Chief Complaint  Patient presents with  . Headache  . Emesis   She and her significant other states that she had oral surgery to remove 3 with some teeth 2 on the right one on the left on Tuesday. She went back on Thursday with excruciating pain they switched her from Vicodin to Percocet still having a lot of pain headache nausea and vomiting as well. According to her significant other she has never gone above 100 but she has felt hot and warm. Pain now described as a 10 over 10   (Consider location/radiation/quality/duration/timing/severity/associated sxs/prior Treatment) Patient is a 40 y.o. female presenting with headaches, vomiting, and tooth pain. The history is provided by the patient and a significant other. No language interpreter was used.  Headache Pain location:  L temporal and R temporal Quality:  Sharp Radiates to:  Does not radiate Severity at highest:  10/10 Progression:  Worsening Relieved by:  Nothing Ineffective treatments:  Prescription medications Associated symptoms: dizziness, myalgias and vomiting   Vomiting:    Quality:  Stomach contents   Severity:  Moderate Emesis Associated symptoms: headaches and myalgias   Dental Pain Location:  Generalized Quality:  Sharp and throbbing Severity:  Severe Duration:  6 days Progression:  Worsening Context: recent dental surgery   Prior workup: With some teeth extraction. Relieved by:  Nothing Ineffective treatments: Initially Vicodin now Percocet. Associated symptoms: headaches     Past Medical History  Diagnosis Date  . Bladder infection   . Ovarian cyst   . Anemia   . GERD (gastroesophageal reflux disease)    Past Surgical History  Procedure Laterality Date  . Cholecystectomy    . Cesarean section    . Back surgery    . Laparoscopic hysterectomy N/A  05/06/2015    Procedure: HYSTERECTOMY TOTAL LAPAROSCOPIC;  Surgeon: Aletha Halim, MD;  Location: [facility];  Service: Gynecology;  Laterality: N/A;  . Cystoscopy N/A 05/06/2015    Procedure: CYSTOSCOPY;  Surgeon: Aletha Halim, MD;  Location: [facility];  Service: Gynecology;  Laterality: N/A;   Family History  Problem Relation Age of Onset  . Diabetes Mother   . Hypertension Mother   . Diabetes Father   . Hypertension Father    Social History  Substance Use Topics  . Smoking status: Former Smoker -- 0.50 packs/day for 7 years    Types: Cigarettes  . Smokeless tobacco: None  . Alcohol Use: Yes     Comment: socially   OB History    No data available     Review of Systems  Gastrointestinal: Positive for vomiting.  Musculoskeletal: Positive for myalgias.  Neurological: Positive for dizziness and headaches.    Allergies  Review of patient's allergies indicates no known allergies.  Home Medications   Prior to Admission medications   Medication Sig Start Date End Date Taking? Authorizing Provider  amoxicillin-clavulanate (AUGMENTIN) 875-125 MG tablet Take 1 tablet by mouth 2 (two) times daily. 10/26/15   Frederich Cha, MD  meloxicam (MOBIC) 15 MG tablet Take 1 tablet (15 mg total) by mouth daily. Do not take w/motrin or alleve 10/26/15   Frederich Cha, MD  NICOTINE STEP 1 21 MG/24HR patch PLACE 1 PATCH (21 MG TOTAL) ONTO THE SKIN DAILY. 09/26/15   Juline Patch, MD  ondansetron (ZOFRAN ODT) 8 MG disintegrating tablet Take  1 tablet (8 mg total) by mouth every 8 (eight) hours as needed for nausea or vomiting. 10/26/15   Frederich Cha, MD  traMADol (ULTRAM) 50 MG tablet Take 1 tablet (50 mg total) by mouth every 8 (eight) hours as needed for moderate pain. 09/01/15   Juline Patch, MD   Meds Ordered and Administered this Visit   Medications  ondansetron (ZOFRAN-ODT) disintegrating tablet 8 mg (not administered)  ketorolac (TORADOL) injection 60 mg (60 mg Intramuscular Given 10/26/15 1047)     BP 142/109 mmHg  Pulse 104  Temp(Src) 98.2 F (36.8 C) (Tympanic)  Ht 5\' 7"  (1.702 m)  Wt 135 lb (61.236 kg)  BMI 21.14 kg/m2  SpO2 100%  LMP  (LMP Unknown) No data found.   Physical Exam  Constitutional: She is oriented to person, place, and time. She appears well-developed and well-nourished.  HENT:  Head: Normocephalic and atraumatic.  Right Ear: External ear normal.  Left Ear: External ear normal.  Mouth/Throat: Dental abscesses present.    Marked tenderness over the right lower gum and right upper gum as well reproducing the headache patient was having. The some asymmetry of the face with swelling on the right side compared to the left  Eyes: Conjunctivae are normal. Pupils are equal, round, and reactive to light.  Neck: Normal range of motion. Neck supple. No tracheal deviation present. No thyromegaly present.  Musculoskeletal: Normal range of motion.  Neurological: She is alert and oriented to person, place, and time.  Skin: Skin is warm and dry.  Psychiatric: She has a normal mood and affect.  Vitals reviewed.   ED Course  Procedures (including critical care time)  Labs Review Labs Reviewed - No data to display  Imaging Review No results found.   Visual Acuity Review  Right Eye Distance:   Left Eye Distance:   Bilateral Distance:    Right Eye Near:   Left Eye Near:    Bilateral Near:         MDM   1. Abscess of oral space   2. Acute nonintractable headache, unspecified headache type   3. Non-intractable vomiting with nausea, vomiting of unspecified type    Patient will be given a shot of Toradol and Zofran here. Will treat infection of the oral space and tissue with Augmentin 875 one tablet twice day since she's afebrile here Zofran 8 mg sublingual and Mobic 15 mg. Recommend she continue the current pain medicine that she is on. She denies needing work note for tomorrow.    Frederich Cha, MD 10/26/15 1059

## 2015-10-30 ENCOUNTER — Encounter: Payer: Self-pay | Admitting: *Deleted

## 2015-10-30 ENCOUNTER — Encounter: Payer: Self-pay | Admitting: Family Medicine

## 2015-10-30 ENCOUNTER — Emergency Department: Payer: BLUE CROSS/BLUE SHIELD

## 2015-10-30 ENCOUNTER — Ambulatory Visit (INDEPENDENT_AMBULATORY_CARE_PROVIDER_SITE_OTHER): Payer: BLUE CROSS/BLUE SHIELD | Admitting: Family Medicine

## 2015-10-30 ENCOUNTER — Emergency Department
Admission: EM | Admit: 2015-10-30 | Discharge: 2015-10-30 | Disposition: A | Discharge status: Home / Self Care | Payer: BLUE CROSS/BLUE SHIELD | Attending: Emergency Medicine | Admitting: Emergency Medicine

## 2015-10-30 VITALS — BP 120/90 | HR 88 | Ht 67.0 in | Wt 150.0 lb

## 2015-10-30 DIAGNOSIS — K0889 Other specified disorders of teeth and supporting structures: Secondary | ICD-10-CM | POA: Diagnosis present

## 2015-10-30 DIAGNOSIS — Z87891 Personal history of nicotine dependence: Secondary | ICD-10-CM | POA: Diagnosis not present

## 2015-10-30 DIAGNOSIS — Z79899 Other long term (current) drug therapy: Secondary | ICD-10-CM | POA: Diagnosis not present

## 2015-10-30 DIAGNOSIS — R51 Headache: Secondary | ICD-10-CM

## 2015-10-30 DIAGNOSIS — J0120 Acute ethmoidal sinusitis, unspecified: Secondary | ICD-10-CM | POA: Diagnosis not present

## 2015-10-30 DIAGNOSIS — J0100 Acute maxillary sinusitis, unspecified: Secondary | ICD-10-CM | POA: Diagnosis not present

## 2015-10-30 DIAGNOSIS — Z792 Long term (current) use of antibiotics: Secondary | ICD-10-CM | POA: Diagnosis not present

## 2015-10-30 DIAGNOSIS — R519 Headache, unspecified: Secondary | ICD-10-CM

## 2015-10-30 MED ORDER — TRAMADOL HCL 50 MG PO TABS
50.0000 mg | ORAL_TABLET | Freq: Three times a day (TID) | ORAL | Status: DC | PRN
Start: 2015-10-30 — End: 2015-11-13

## 2015-10-30 MED ORDER — FLUTICASONE PROPIONATE 50 MCG/ACT NA SUSP: 1.0000 | Freq: Every day | NASAL | Status: DC

## 2015-10-30 MED ORDER — LORATADINE-PSEUDOEPHEDRINE ER 5-120 MG PO TB12: 1.0000 | ORAL_TABLET | Freq: Two times a day (BID) | ORAL | Status: DC

## 2015-10-30 MED ORDER — DEXAMETHASONE SODIUM PHOSPHATE 10 MG/ML IJ SOLN
INTRAMUSCULAR | Status: DC
Start: 2015-10-30 — End: 2015-10-30
  Filled 2015-10-30: qty 1

## 2015-10-30 MED ORDER — PREDNISONE 10 MG PO TABS: 10.0000 mg | ORAL_TABLET | Freq: Two times a day (BID) | ORAL | Status: DC

## 2015-10-30 MED ORDER — DEXAMETHASONE SODIUM PHOSPHATE 10 MG/ML IJ SOLN
10.0000 mg | Freq: Once | INTRAMUSCULAR | Status: AC
  Administered 2015-10-30: 10 mg via INTRAMUSCULAR

## 2015-10-30 NOTE — Progress Notes (Signed)
Name: Linda Rhodes   MRN: [id number]    DOB: Mar 18, 1976   Date:10/30/2015       Progress Note  Subjective  Chief Complaint  Chief Complaint  Patient presents with  . Follow-up    went to urgent care on 10/28/2015- had wisdom teeth removed and since then has had a bad taste in mouth, drainage, facial pain/pressure- was seen by dentist and was told "might have torn my sinus"    Sinusitis This is a recurrent problem. The current episode started 1 to 4 weeks ago. The problem has been waxing and waning since onset. The maximum temperature recorded prior to her arrival was 101 - 101.9 F. Her pain is at a severity of 4/10. The pain is moderate. Associated symptoms include chills, congestion, coughing, diaphoresis, ear pain, a sore throat and swollen glands. Pertinent negatives include no headaches, hoarse voice, neck pain, shortness of breath, sinus pressure or sneezing. Past treatments include acetaminophen and antibiotics (hydrocodone). The treatment provided no relief.  Headache  This is a new problem. The current episode started 1 to 4 weeks ago. The problem occurs constantly. The problem has been waxing and waning. The pain is located in the retro-orbital and right unilateral region. The pain quality is not similar to prior headaches. The quality of the pain is described as aching. The pain is at a severity of 7/10. The pain is moderate. Associated symptoms include coughing, ear pain, nausea, phonophobia, photophobia, a sore throat and swollen glands. Pertinent negatives include no abdominal pain, back pain, blurred vision, dizziness, eye redness, fever, hearing loss, insomnia, neck pain, sinus pressure, tingling or weight loss. She has tried acetaminophen and oral narcotics for the symptoms. The treatment provided no relief. There is no history of migraine headaches, recent head traumas or TMJ. (Recent oral surgery)    No problem-specific assessment & plan notes found for this  encounter.   Past Medical History  Diagnosis Date  . Bladder infection   . Ovarian cyst   . Anemia   . GERD (gastroesophageal reflux disease)     Past Surgical History  Procedure Laterality Date  . Cholecystectomy    . Cesarean section    . Back surgery    . Laparoscopic hysterectomy N/A 05/06/2015    Procedure: HYSTERECTOMY TOTAL LAPAROSCOPIC;  Surgeon: Aletha Halim, MD;  Location: [facility];  Service: Gynecology;  Laterality: N/A;  . Cystoscopy N/A 05/06/2015    Procedure: CYSTOSCOPY;  Surgeon: Aletha Halim, MD;  Location: [facility];  Service: Gynecology;  Laterality: N/A;    Family History  Problem Relation Age of Onset  . Diabetes Mother   . Hypertension Mother   . Diabetes Father   . Hypertension Father     Social History   Social History  . Marital Status: Single    Spouse Name: N/A  . Number of Children: N/A  . Years of Education: N/A   Occupational History  . Not on file.   Social History Main Topics  . Smoking status: Former Smoker -- 0.50 packs/day for 7 years    Types: Cigarettes  . Smokeless tobacco: Not on file  . Alcohol Use: Yes     Comment: socially  . Drug Use: No  . Sexual Activity: Yes   Other Topics Concern  . Not on file   Social History Narrative    No Known Allergies   Review of Systems  Constitutional: Positive for chills and diaphoresis. Negative for fever, weight loss and malaise/fatigue.  HENT: Positive  for congestion, ear pain and sore throat. Negative for ear discharge, hearing loss, hoarse voice, sinus pressure and sneezing.   Eyes: Positive for photophobia. Negative for blurred vision and redness.  Respiratory: Positive for cough. Negative for sputum production, shortness of breath and wheezing.   Cardiovascular: Negative for chest pain, palpitations and leg swelling.  Gastrointestinal: Positive for nausea. Negative for heartburn, abdominal pain, diarrhea, constipation, blood in stool and melena.  Genitourinary:  Negative for dysuria, urgency, frequency and hematuria.  Musculoskeletal: Negative for myalgias, back pain, joint pain and neck pain.  Skin: Negative for rash.  Neurological: Negative for dizziness, tingling, sensory change, focal weakness and headaches.  Endo/Heme/Allergies: Negative for environmental allergies and polydipsia. Does not bruise/bleed easily.  Psychiatric/Behavioral: Negative for depression and suicidal ideas. The patient is not nervous/anxious and does not have insomnia.      Objective  Filed Vitals:   10/30/15 1352  BP: 120/90  Pulse: 88  Height: 5\' 7"  (1.702 m)  Weight: 150 lb (68.04 kg)    Physical Exam  Constitutional: She is oriented to person, place, and time and well-developed, well-nourished, and in no distress. No distress.  HENT:  Head: Normocephalic and atraumatic.  Right Ear: External ear normal.  Left Ear: External ear normal.  Nose: Nose normal.  Mouth/Throat: Oropharynx is clear and moist.  Eyes: Conjunctivae and EOM are normal. Pupils are equal, round, and reactive to light. Right eye exhibits no discharge. Left eye exhibits no discharge.  Neck: Normal range of motion. Neck supple. No JVD present. No thyromegaly present.  Cardiovascular: Normal rate, regular rhythm, normal heart sounds and intact distal pulses.  Exam reveals no gallop and no friction rub.   No murmur heard. Pulmonary/Chest: Effort normal and breath sounds normal.  Abdominal: Soft. Bowel sounds are normal. She exhibits no mass. There is no tenderness. There is no guarding.  Musculoskeletal: Normal range of motion. She exhibits no edema.  Lymphadenopathy:    She has no cervical adenopathy.  Neurological: She is alert and oriented to person, place, and time. She has normal sensation, normal reflexes and intact cranial nerves.  Skin: Skin is warm and dry. She is not diaphoretic.  Psychiatric: Mood and affect normal.      Assessment & Plan  Problem List Items Addressed This  Visit    None    Visit Diagnoses    Acute intractable headache, unspecified headache type    -  Primary    Relevant Medications    traMADol (ULTRAM) 50 MG tablet    Acute maxillary sinusitis, recurrence not specified             Dr. Macon [facility]  10/30/2015

## 2015-10-30 NOTE — Discharge Instructions (Signed)
Sinus Headache A sinus headache occurs when the paranasal sinuses become clogged or swollen. Paranasal sinuses are air pockets within the bones of the face. Sinus headaches can range from mild to severe. CAUSES A sinus headache can result from various conditions that affect the sinuses, such as:  Colds.  Sinus infections.  Allergies. SYMPTOMS The main symptom of this condition is a headache that may feel like pain or pressure in the face, forehead, ears, or upper teeth. People who have a sinus headache often have other symptoms, such as:  Congested or runny nose.  Fever.  Inability to smell. Weather changes can make symptoms worse. DIAGNOSIS This condition may be diagnosed based on:  A physical exam and medical history.  Imaging tests, such as a CT scan and MRI, to check for problems with the sinuses.  A specialist may look into the sinuses with a tool that has a camera (endoscopy). TREATMENT Treatment for this condition depends on the cause.  Sinus pain that is caused by a sinus infection may be treated with antibiotic medicine.  Sinus pain that is caused by allergies may be helped by allergy medicines (antihistamines) and medicated nasal sprays.  Sinus pain that is caused by congestion may be helped by flushing the nose and sinuses with saline solution. HOME CARE INSTRUCTIONS  Take medicines only as directed by your health care provider.  If you were prescribed an antibiotic medicine, finish all of it even if you start to feel better.  If you have congestion, use a nasal spray to help reduce pressure.  If directed, apply a warm, moist washcloth to your face to help relieve pain. SEEK MEDICAL CARE IF:  You have headaches more than one time each week.  You have sensitivity to light or sound.  You have a fever.  You feel sick to your stomach (nauseous) or you throw up (vomit).  Your headaches do not get better with treatment. Many people think that they have a  sinus headache when they actually have migraines or tension headaches. SEEK IMMEDIATE MEDICAL CARE IF:  You have vision problems.  You have sudden, severe pain in your face or head.  You have a seizure.  You are confused.  You have a stiff neck.   This information is not intended to replace advice given to you by your health care provider. Make sure you discuss any questions you have with your health care provider.   Document Released: 11/11/2004 Document Revised: 02/18/2015 Document Reviewed: 09/30/2014 Elsevier Interactive Patient Education [employer].  Your exam and CT scan confirm right-sided sinusitis. You should continue to dose your previously prescribed antibiotic. Continue as well your pain medicine and nausea medicine. You will STOP your Mobic for the next 5 days while dosing the prednisone. Take the other medicines as directed. Follow-up with your provider for ongoing symptoms. Increase fluid intake and consider using a warm air humidifier overnight.

## 2015-10-30 NOTE — ED Provider Notes (Signed)
[facility] Emergency Department Provider Note ____________________________________________  Time seen: 1700  I have reviewed the triage vital signs and the nursing notes.  HISTORY  Chief Complaint  Dental Pain  HPI Linda Rhodes is a 40 y.o. female presents to the ED from an earlier visit at [facility]. She was evaluated by the provider there, and advised to report to the ED for further evaluation of headache & facial pain via head CT. She reports recent wisdom tooth extraction on Tuesday, January 3rd. She had a dental follow-up in Thursday, January 5th due to ongoing pain. By Sunday, January 8th, she had presented to [facility] for evaluation of continued dental pain and headache. She was placed on Augmentin and provided with a prescription for Ultram. She reports her pain at 6/10 in triage.   Past Medical History  Diagnosis Date  . Bladder infection   . Ovarian cyst   . Anemia   . GERD (gastroesophageal reflux disease)     Patient Active Problem List   Diagnosis Date Noted  . S/P hysterectomy 05/06/2015    Past Surgical History  Procedure Laterality Date  . Cholecystectomy    . Cesarean section    . Back surgery    . Laparoscopic hysterectomy N/A 05/06/2015    Procedure: HYSTERECTOMY TOTAL LAPAROSCOPIC;  Surgeon: Aletha Halim, MD;  Location: [facility] ORS;  Service: Gynecology;  Laterality: N/A;  . Cystoscopy N/A 05/06/2015    Procedure: CYSTOSCOPY;  Surgeon: Aletha Halim, MD;  Location: [facility] OR[facility];  Service: Gynecology;  Laterality: N/A;    Current Outpatient Rx  Name  Route  Sig  Dispense  Refill  . amoxicillin-clavulanate (AUGMENTIN) 875-125 MG tablet   Oral   Take 1 tablet by mouth 2 (two) times daily.   20 tablet   0   . fluticasone (FLONASE) 50 MCG/ACT nasal spray   Each Nare   Place 1 spray into both nostrils daily.   16 g   0   . loratadine-pseudoephedrine (CLARITIN-D 12 HOUR) 5-120 MG tablet   Oral   Take 1  tablet by mouth 2 (two) times daily.   30 tablet   0   . meloxicam (MOBIC) 15 MG tablet   Oral   Take 1 tablet (15 mg total) by mouth daily. Do not take w/motrin or alleve   30 tablet   0   . NICOTINE STEP 1 21 MG/24HR patch      PLACE 1 PATCH (21 MG TOTAL) ONTO THE SKIN DAILY.   28 patch   0   . ondansetron (ZOFRAN ODT) 8 MG disintegrating tablet   Oral   Take 1 tablet (8 mg total) by mouth every 8 (eight) hours as needed for nausea or vomiting.   20 tablet   0   . predniSONE (DELTASONE) 10 MG tablet   Oral   Take 1 tablet (10 mg total) by mouth 2 (two) times daily with a meal.   10 tablet   0   . traMADol (ULTRAM) 50 MG tablet   Oral   Take 1 tablet (50 mg total) by mouth every 8 (eight) hours as needed.   30 tablet   0    Allergies Review of patient's allergies indicates no known allergies.  Family History  Problem Relation Age of Onset  . Diabetes Mother   . Hypertension Mother   . Diabetes Father   . Hypertension Father     Social History Social History  Substance Use  Topics  . Smoking status: Former Smoker -- 0.50 packs/day for 7 years    Types: Cigarettes  . Smokeless tobacco: None  . Alcohol Use: Yes     Comment: socially   Review of Systems  Constitutional: Negative for fever. Eyes: Negative for visual changes. ENT: Negative for sore throat. Right-sided sinus pressure Cardiovascular: Negative for chest pain. Respiratory: Negative for shortness of breath. Gastrointestinal: Negative for abdominal pain, vomiting and diarrhea. Genitourinary: Negative for dysuria. Musculoskeletal: Negative for back pain. Skin: Negative for rash. Neurological: Negative for focal weakness or numbness. Reports headache as above ____________________________________________  PHYSICAL EXAM:  VITAL SIGNS: ED Triage Vitals  Enc Vitals Group     BP 10/30/15 1552 143/103 mmHg     Pulse Rate 10/30/15 1552 94     Resp 10/30/15 1552 18     Temp 10/30/15 1552 98.8 F  (37.1 C)     Temp Source 10/30/15 1552 Oral     SpO2 10/30/15 1552 99 %     Weight 10/30/15 1552 150 lb (68.04 kg)     Height 10/30/15 1552 5\' 7"  (1.702 m)     Head Cir --      Peak Flow --      Pain Score 10/30/15 1553 6     Pain Loc --      Pain Edu? --      Excl. in [place]? --    Constitutional: Alert and oriented. Well appearing and in no distress. Patient sitting in the darkened exam room upon entering.  Head: Normocephalic and atraumatic.      Eyes: Conjunctivae are normal. PERRL. Normal extraocular movements      Ears: Canals clear. TMs intact bilaterally.   Nose: No congestion/rhinorrhea.   Mouth/Throat: Mucous membranes are moist.   Neck: Supple. No thyromegaly. Hematological/Lymphatic/Immunological: No cervical lymphadenopathy. Cardiovascular: Normal rate, regular rhythm.  Respiratory: Normal respiratory effort. No wheezes/rales/rhonchi. Gastrointestinal: Soft and nontender. No distention, rebound, or guarding. Musculoskeletal: Nontender with normal range of motion in all extremities.  Neurologic:  Normal gait without ataxia. Normal speech and language. No gross focal neurologic deficits are appreciated. Skin:  Skin is warm, dry and intact. No rash noted. Psychiatric: Mood and affect are normal. Patient exhibits appropriate insight and judgment. ____________________________________________   RADIOLOGY Head CT IMPRESSION: 1. No evidence of acute intracranial abnormality. 2. Partially visualized right ethmoid and maxillary sinus opacification, correlate for acute sinusitis. ____________________________________________  PROCEDURES  Decadron 10 mg IM ____________________________________________  INITIAL IMPRESSION / ASSESSMENT AND PLAN / ED COURSE  Patient acute sinusitis on the right, confirmed by CT scan. Negative intracranial process to otherwise explain headache. Will advise patient to complete previously prescribed Augmentin as directed. She will continue,  as well, her Zofran and Ultram. We will provide a short course of prednisone in lieu of her Mobic. She will also be provided with Flonase and Claritin-D to dose for symptom relief. Follow-up with [facility] as needed.  ____________________________________________  FINAL CLINICAL IMPRESSION(S) / ED DIAGNOSES  Final diagnoses:  Acute nonintractable headache, unspecified headache type  Acute ethmoidal sinusitis, recurrence not specified  Acute maxillary sinusitis, recurrence not specified      Melvenia Needles, PA-C 10/31/15 1807  Hinda Kehr, MD 11/05/15 2258

## 2015-10-30 NOTE — ED Notes (Addendum)
States she had dental surgrey last Tuesday and states she got an infection and its not any better, states headache and sinus pain, states she saw her PCP and was sent here, dentist told her he might have "tore a sinus"

## 2015-10-30 NOTE — ED Notes (Signed)
Assess per PA 

## 2015-11-13 ENCOUNTER — Encounter: Payer: Self-pay | Admitting: Family Medicine

## 2015-11-13 ENCOUNTER — Ambulatory Visit (INDEPENDENT_AMBULATORY_CARE_PROVIDER_SITE_OTHER): Payer: BLUE CROSS/BLUE SHIELD | Admitting: Family Medicine

## 2015-11-13 VITALS — BP 130/98 | HR 88 | Ht 67.0 in | Wt 149.0 lb

## 2015-11-13 DIAGNOSIS — I10 Essential (primary) hypertension: Secondary | ICD-10-CM

## 2015-11-13 DIAGNOSIS — J0140 Acute pansinusitis, unspecified: Secondary | ICD-10-CM | POA: Diagnosis not present

## 2015-11-13 MED ORDER — CEFUROXIME AXETIL 250 MG PO TABS: 250.0000 mg | ORAL_TABLET | Freq: Two times a day (BID) | ORAL | Status: DC

## 2015-11-13 MED ORDER — HYDROCHLOROTHIAZIDE 25 MG PO TABS: 25.0000 mg | ORAL_TABLET | Freq: Every day | ORAL | Status: DC

## 2015-11-13 NOTE — Progress Notes (Signed)
Name: Linda Rhodes   MRN: [id number]    DOB: 14-Sep-1976   Date:11/13/2015       Progress Note  Subjective  Chief Complaint  Chief Complaint  Patient presents with  . Sinusitis    still green production with bad taste in her mouth and nose. Has a headache especially around the R) eye. Finshed Augmentin on 1/18    Sinusitis This is a chronic problem. The current episode started more than 1 month ago. The problem has been waxing and waning since onset. The maximum temperature recorded prior to her arrival was 100.4 - 100.9 F. Associated symptoms include chills, congestion, headaches, sinus pressure, a sore throat and swollen glands. Pertinent negatives include no coughing, ear pain, neck pain or shortness of breath. Past treatments include acetaminophen and antibiotics. The treatment provided no relief.  Hypertension This is a new problem. The current episode started 1 to 4 weeks ago. The problem is unchanged. Associated symptoms include headaches. Pertinent negatives include no anxiety, blurred vision, chest pain, malaise/fatigue, neck pain, orthopnea, palpitations, peripheral edema, PND, shortness of breath or sweats. There are no associated agents to hypertension. There are no known risk factors for coronary artery disease. Past treatments include nothing. The current treatment provides mild improvement. There are no compliance problems.  There is no history of angina, kidney disease, CAD/MI, CVA, heart failure, left ventricular hypertrophy, PVD, renovascular disease or retinopathy. There is no history of chronic renal disease or a hypertension causing med.    No problem-specific assessment & plan notes found for this encounter.   Past Medical History  Diagnosis Date  . Bladder infection   . Ovarian cyst   . Anemia   . GERD (gastroesophageal reflux disease)     Past Surgical History  Procedure Laterality Date  . Cholecystectomy    . Cesarean section    . Back surgery    .  Laparoscopic hysterectomy N/A 05/06/2015    Procedure: HYSTERECTOMY TOTAL LAPAROSCOPIC;  Surgeon: Aletha Halim, MD;  Location: [facility] ORS;  Service: Gynecology;  Laterality: N/A;  . Cystoscopy N/A 05/06/2015    Procedure: CYSTOSCOPY;  Surgeon: Aletha Halim, MD;  Location: [facility] OR[facility];  Service: Gynecology;  Laterality: N/A;    Family History  Problem Relation Age of Onset  . Diabetes Mother   . Hypertension Mother   . Diabetes Father   . Hypertension Father     Social History   Social History  . Marital Status: Single    Spouse Name: N/A  . Number of Children: N/A  . Years of Education: N/A   Occupational History  . Not on file.   Social History Main Topics  . Smoking status: Former Smoker -- 0.50 packs/day for 7 years    Types: Cigarettes  . Smokeless tobacco: Not on file  . Alcohol Use: Yes     Comment: socially  . Drug Use: No  . Sexual Activity: Yes   Other Topics Concern  . Not on file   Social History Narrative    No Known Allergies   Review of Systems  Constitutional: Positive for chills. Negative for fever, weight loss and malaise/fatigue.  HENT: Positive for congestion, sinus pressure and sore throat. Negative for ear discharge, ear pain and nosebleeds.        "bad" smell and taste  Eyes: Negative for blurred vision.  Respiratory: Negative for cough, sputum production, shortness of breath and wheezing.   Cardiovascular: Negative for chest pain, palpitations, orthopnea, leg swelling and PND.  Gastrointestinal: Negative for heartburn, nausea, abdominal pain, diarrhea, constipation, blood in stool and melena.  Genitourinary: Negative for dysuria, urgency, frequency and hematuria.  Musculoskeletal: Negative for myalgias, back pain, joint pain and neck pain.  Skin: Negative for rash.  Neurological: Positive for headaches. Negative for dizziness, tingling, sensory change and focal weakness.  Endo/Heme/Allergies: Negative for environmental allergies and  polydipsia. Does not bruise/bleed easily.  Psychiatric/Behavioral: Negative for depression and suicidal ideas. The patient is not nervous/anxious and does not have insomnia.      Objective  Filed Vitals:   11/13/15 0812  BP: 130/98  Pulse: 88  Height: 5\' 7"  (1.702 m)  Weight: 149 lb (67.586 kg)    Physical Exam  Constitutional: She is well-developed, well-nourished, and in no distress. No distress.  HENT:  Head: Normocephalic and atraumatic.  Right Ear: Tympanic membrane, external ear and ear canal normal.  Left Ear: Tympanic membrane, external ear and ear canal normal.  Nose: No mucosal edema. Right sinus exhibits maxillary sinus tenderness and frontal sinus tenderness. Left sinus exhibits no maxillary sinus tenderness and no frontal sinus tenderness.  Mouth/Throat: Oropharynx is clear and moist. No oropharyngeal exudate, posterior oropharyngeal edema or posterior oropharyngeal erythema.  Eyes: Conjunctivae and EOM are normal. Pupils are equal, round, and reactive to light. Right eye exhibits no discharge. Left eye exhibits no discharge.  Neck: Normal range of motion. Neck supple. No JVD present. No thyromegaly present.  Cardiovascular: Normal rate, regular rhythm, normal heart sounds and intact distal pulses.  Exam reveals no gallop and no friction rub.   No murmur heard. Pulmonary/Chest: Effort normal and breath sounds normal.  Abdominal: Soft. Bowel sounds are normal. She exhibits no mass. There is no tenderness. There is no guarding.  Musculoskeletal: Normal range of motion. She exhibits no edema.  Lymphadenopathy:    She has no cervical adenopathy.  Neurological: She is alert. She has normal reflexes.  Skin: Skin is warm and dry. She is not diaphoretic.  Psychiatric: Mood and affect normal.  Nursing note and vitals reviewed.     Assessment & Plan  Problem List Items Addressed This Visit    None    Visit Diagnoses    Acute pansinusitis, recurrence not specified    -   Primary    s/p tooth removal    Relevant Medications    cefUROXime (CEFTIN) 250 MG tablet    Other Relevant Orders    Ambulatory referral to ENT    Essential hypertension        Relevant Medications    hydrochlorothiazide (HYDRODIURIL) 25 MG tablet         Dr. Deanna Jones [facility]  11/13/2015

## 2015-11-20 ENCOUNTER — Encounter: Payer: Self-pay | Admitting: *Deleted

## 2015-11-24 NOTE — Discharge Instructions (Signed)
[facility] °[facility] °ENDOSCOPIC SINUS SURGERY °[facility] ° °What is Functional Endoscopic Sinus Surgery? ° The Surgery involves making the natural openings of the sinuses larger by removing the bony partitions that separate the sinuses from the nasal cavity.  The natural sinus lining is preserved as much as possible to allow the sinuses to resume normal function after the surgery.  In some patients nasal polyps (excessively swollen lining of the sinuses) may be removed to relieve obstruction of the sinus openings.  The surgery is performed through the nose using lighted scopes, which eliminates the need for incisions on the face.  A septoplasty is a different procedure which is sometimes performed with sinus surgery.  It involves straightening the boy partition that separates the two sides of your nose.  A crooked or deviated septum may need repair if is obstructing the sinuses or nasal airflow.  Turbinate reduction is also often performed during sinus surgery.  The turbinates are bony proturberances from the side walls of the nose which swell and can obstruct the nose in patients with sinus and allergy problems.  Their size can be surgically reduced to help relieve nasal obstruction. ° °What Can Sinus Surgery Do For Me? ° Sinus surgery can reduce the frequency of sinus infections requiring antibiotic treatment.  This can provide improvement in nasal congestion, post-nasal drainage, facial pressure and nasal obstruction.  Surgery will NOT prevent you from ever having an infection again, so it usually only for patients who get infections 4 or more times yearly requiring antibiotics, or for infections that do not clear with antibiotics.  It will not cure nasal allergies, so patients with allergies may still require medication to treat their allergies after surgery. Surgery may improve headaches related to sinusitis, however, some people will continue to  require medication to control sinus headaches related to allergies.  Surgery will do nothing for other forms of headache (migraine, tension or cluster). ° °What Are the Risks of Endoscopic Sinus Surgery? ° Current techniques allow surgery to be performed safely with little risk, however, there are rare complications that patients should be aware of.  Because the sinuses are located around the eyes, there is risk of eye injury, including blindness, though again, this would be quite rare. This is usually a result of bleeding behind the eye during surgery, which puts the vision oat risk, though there are treatments to protect the vision and prevent permanent disrupted by surgery causing a leak of the spinal fluid that surrounds the brain.  More serious complications would include bleeding inside the brain cavity or damage to the brain.  Again, all of these complications are uncommon, and spinal fluid leaks can be safely managed surgically if they occur.  The most common complication of sinus surgery is bleeding from the nose, which may require packing or cauterization of the nose.  Continued sinus have polyps may experience recurrence of the polyps requiring revision surgery.  Alterations of sense of smell or injury to the tear ducts are also rare complications.  ° °What is the Surgery Like, and what is the Recovery? ° The Surgery usually takes a couple of hours to perform, and is usually performed under a general anesthetic (completely asleep).  Patients are usually discharged home after a couple of hours.  Sometimes during surgery it is necessary to pack the nose to control bleeding, and the packing is left in place for 24 - 48 hours, and removed by your surgeon.    If a septoplasty was performed during the procedure, there is often a splint placed which must be removed after 5-7 days.   °Discomfort: Pain is usually mild to moderate, and can be controlled by prescription pain medication or acetaminophen (Tylenol).   Aspirin, Ibuprofen (Advil, Motrin), or Naprosyn (Aleve) should be avoided, as they can cause increased bleeding.  Most patients feel sinus pressure like they have a bad head cold for several days.  Sleeping with your head elevated can help reduce swelling and facial pressure, as can ice packs over the face.  A humidifier may be helpful to keep the mucous and blood from drying in the nose.  ° °Diet: There are no specific diet restrictions, however, you should generally start with clear liquids and a light diet of bland foods because the anesthetic can cause some nausea.  Advance your diet depending on how your stomach feels.  Taking your pain medication with food will often help reduce stomach upset which pain medications can cause. ° °Nasal Saline Irrigation: It is important to remove blood clots and dried mucous from the nose as it is healing.  This is done by having you irrigate the nose at least 3 - 4 times daily with a salt water solution.  We recommend using NeilMed Sinus Rinse (available at the drug store).  Fill the squeeze bottle with the solution, bend over a sink, and insert the tip of the squeeze bottle into the nose ½ of an inch.  Point the tip of the squeeze bottle towards the inside corner of the eye on the same side your irrigating.  Squeeze the bottle and gently irrigate the nose.  If you bend forward as you do this, most of the fluid will flow back out of the nose, instead of down your throat.   The solution should be warm, near body temperature, when you irrigate.   Each time you irrigate, you should use a full squeeze bottle.  ° °Note that if you are instructed to use Nasal Steroid Sprays at any time after your surgery, irrigate with saline BEFORE using the steroid spray, so you do not wash it all out of the nose. °Another product, Nasal Saline Gel (such as AYR Nasal Saline Gel) can be applied in each nostril 3 - 4 times daily to moisture the nose and reduce scabbing or crusting. ° °Bleeding:   Bloody drainage from the nose can be expected for several days, and patients are instructed to irrigate their nose frequently with salt water to help remove mucous and blood clots.  The drainage may be dark red or brown, though some fresh blood may be seen intermittently, especially after irrigation.  Do not blow you nose, as bleeding may occur. If you must sneeze, keep your mouth open to allow air to escape through your mouth. ° °If heavy bleeding occurs: Irrigate the nose with saline to rinse out clots, then spray the nose 3 - 4 times with Afrin Nasal Decongestant Spray.  The spray will constrict the blood vessels to slow bleeding.  Pinch the lower half of your nose shut to apply pressure, and lay down with your head elevated.  Ice packs over the nose may help as well. If bleeding persists despite these measures, you should notify your doctor.  Do not use the Afrin routinely to control nasal congestion after surgery, as it can result in worsening congestion and may affect healing.  ° ° ° °Activity: Return to work varies among patients. Most patients will be   out of work at least 5 - 7 days to recover.  Patient may return to work after they are off of narcotic pain medication, and feeling well enough to perform the functions of their job.  Patients must avoid heavy lifting (over 10 pounds) or strenuous physical for 2 weeks after surgery, so your employer may need to assign you to light duty, or keep you out of work longer if light duty is not possible.  NOTE: you should not drive, operate dangerous machinery, do any mentally demanding tasks or make any important legal or financial decisions while on narcotic pain medication and recovering from the general anesthetic.  °  °Call Your Doctor Immediately if You Have Any of the Following: °1. Bleeding that you cannot control with the above measures °2. Loss of vision, double vision, bulging of the eye or black eyes. °3. Fever over 101 degrees °4. Neck stiffness with  severe headache, fever, nausea and change in mental state. °You are always encourage to call anytime with concerns, however, please call with requests for pain medication refills during office hours. ° °Office Endoscopy: During follow-up visits your doctor will remove any packing or splints that may have been placed and evaluate and clean your sinuses endoscopically.  Topical anesthetic will be used to make this as comfortable as possible, though you may want to take your pain medication prior to the visit.  How often this will need to be done varies from patient to patient.  After complete recovery from the surgery, you may need follow-up endoscopy from time to time, particularly if there is concern of recurrent infection or nasal polyps. ° °General Anesthesia, Adult, Care After °Refer to this sheet in the next few weeks. These instructions provide you with information on caring for yourself after your procedure. Your health care provider may also give you more specific instructions. Your treatment has been planned according to current medical practices, but problems sometimes occur. Call your health care provider if you have any problems or questions after your procedure. °WHAT TO EXPECT AFTER THE PROCEDURE °After the procedure, it is typical to experience: °· Sleepiness. °· Nausea and vomiting. °HOME CARE INSTRUCTIONS °· For the first 24 hours after general anesthesia: °¨ Have a responsible person with you. °¨ Do not drive a car. If you are alone, do not take public transportation. °¨ Do not drink alcohol. °¨ Do not take medicine that has not been prescribed by your health care provider. °¨ Do not sign important papers or make important decisions. °¨ You may resume a normal diet and activities as directed by your health care provider. °· Change bandages (dressings) as directed. °· If you have questions or problems that seem related to general anesthesia, call the hospital and ask for the anesthetist or  anesthesiologist on call. °SEEK MEDICAL CARE IF: °· You have nausea and vomiting that continue the day after anesthesia. °· You develop a rash. °SEEK IMMEDIATE MEDICAL CARE IF:  °· You have difficulty breathing. °· You have chest pain. °· You have any allergic problems. °  °This information is not intended to replace advice given to you by your health care provider. Make sure you discuss any questions you have with your health care provider. °  °Document Released: 01/10/2001 Document Revised: 10/25/2014 Document Reviewed: 02/02/2012 °Elsevier Interactive Patient Education ©2016 [employer] ° °

## 2015-11-25 ENCOUNTER — Ambulatory Visit: Payer: BLUE CROSS/BLUE SHIELD | Admitting: Anesthesiology

## 2015-11-25 ENCOUNTER — Encounter
Admission: RE | Disposition: A | Discharge status: Home / Self Care | Payer: Self-pay | Source: Ambulatory Visit | Attending: Otolaryngology

## 2015-11-25 ENCOUNTER — Ambulatory Visit
Admission: RE | Admit: 2015-11-25 | Discharge: 2015-11-25 | Disposition: A | Discharge status: Home / Self Care | Payer: BLUE CROSS/BLUE SHIELD | Source: Ambulatory Visit | Attending: Otolaryngology | Admitting: Otolaryngology

## 2015-11-25 DIAGNOSIS — Z90710 Acquired absence of both cervix and uterus: Secondary | ICD-10-CM | POA: Diagnosis not present

## 2015-11-25 DIAGNOSIS — J3489 Other specified disorders of nose and nasal sinuses: Secondary | ICD-10-CM | POA: Insufficient documentation

## 2015-11-25 DIAGNOSIS — R040 Epistaxis: Secondary | ICD-10-CM | POA: Insufficient documentation

## 2015-11-25 DIAGNOSIS — Z7951 Long term (current) use of inhaled steroids: Secondary | ICD-10-CM | POA: Diagnosis not present

## 2015-11-25 DIAGNOSIS — R0982 Postnasal drip: Secondary | ICD-10-CM | POA: Insufficient documentation

## 2015-11-25 DIAGNOSIS — Z833 Family history of diabetes mellitus: Secondary | ICD-10-CM | POA: Insufficient documentation

## 2015-11-25 DIAGNOSIS — Z9889 Other specified postprocedural states: Secondary | ICD-10-CM | POA: Insufficient documentation

## 2015-11-25 DIAGNOSIS — Z8249 Family history of ischemic heart disease and other diseases of the circulatory system: Secondary | ICD-10-CM | POA: Insufficient documentation

## 2015-11-25 DIAGNOSIS — Z9049 Acquired absence of other specified parts of digestive tract: Secondary | ICD-10-CM | POA: Insufficient documentation

## 2015-11-25 DIAGNOSIS — Z820 Family history of epilepsy and other diseases of the nervous system: Secondary | ICD-10-CM | POA: Insufficient documentation

## 2015-11-25 DIAGNOSIS — H6990 Unspecified Eustachian tube disorder, unspecified ear: Secondary | ICD-10-CM | POA: Insufficient documentation

## 2015-11-25 DIAGNOSIS — Z79899 Other long term (current) drug therapy: Secondary | ICD-10-CM | POA: Insufficient documentation

## 2015-11-25 DIAGNOSIS — Z8342 Family history of familial hypercholesterolemia: Secondary | ICD-10-CM | POA: Insufficient documentation

## 2015-11-25 DIAGNOSIS — J329 Chronic sinusitis, unspecified: Secondary | ICD-10-CM | POA: Insufficient documentation

## 2015-11-25 DIAGNOSIS — I10 Essential (primary) hypertension: Secondary | ICD-10-CM | POA: Insufficient documentation

## 2015-11-25 DIAGNOSIS — Z87891 Personal history of nicotine dependence: Secondary | ICD-10-CM | POA: Insufficient documentation

## 2015-11-25 DIAGNOSIS — J309 Allergic rhinitis, unspecified: Secondary | ICD-10-CM | POA: Diagnosis not present

## 2015-11-25 DIAGNOSIS — Z811 Family history of alcohol abuse and dependence: Secondary | ICD-10-CM | POA: Insufficient documentation

## 2015-11-25 HISTORY — DX: Headache, unspecified: R51.9

## 2015-11-25 HISTORY — PX: ETHMOIDECTOMY: SHX5197

## 2015-11-25 HISTORY — DX: Motion sickness, initial encounter: T75.3XXA

## 2015-11-25 HISTORY — PX: MAXILLARY ANTROSTOMY: SHX2003

## 2015-11-25 HISTORY — PX: FRONTAL SINUS EXPLORATION: SHX6591

## 2015-11-25 HISTORY — DX: Headache: R51

## 2015-11-25 HISTORY — PX: IMAGE GUIDED SINUS SURGERY: SHX6570

## 2015-11-25 HISTORY — DX: Essential (primary) hypertension: I10

## 2015-11-25 SURGERY — SINUS SURGERY, WITH IMAGING GUIDANCE
Anesthesia: General | Laterality: Right | Wound class: Clean Contaminated

## 2015-11-25 MED ORDER — PROPOFOL 10 MG/ML IV BOLUS
INTRAVENOUS | Status: DC | PRN
  Administered 2015-11-25: 200 mg via INTRAVENOUS

## 2015-11-25 MED ORDER — ACETAMINOPHEN 10 MG/ML IV SOLN
1000.0000 mg | Freq: Once | INTRAVENOUS | Status: AC
  Administered 2015-11-25: 1000 mg via INTRAVENOUS

## 2015-11-25 MED ORDER — LIDOCAINE HCL (CARDIAC) 20 MG/ML IV SOLN
INTRAVENOUS | Status: DC | PRN
  Administered 2015-11-25: 40 mg via INTRAVENOUS

## 2015-11-25 MED ORDER — LACTATED RINGERS IV SOLN: 500.0000 mL | INTRAVENOUS | Status: DC

## 2015-11-25 MED ORDER — OXYCODONE-ACETAMINOPHEN 5-325 MG PO TABS: 1.0000 | ORAL_TABLET | ORAL | Status: DC | PRN

## 2015-11-25 MED ORDER — FLUTICASONE PROPIONATE 50 MCG/ACT NA SUSP: 2.0000 | Freq: Every day | NASAL | Status: DC

## 2015-11-25 MED ORDER — DEXAMETHASONE SODIUM PHOSPHATE 4 MG/ML IJ SOLN
INTRAMUSCULAR | Status: DC | PRN
  Administered 2015-11-25: 8 mg via INTRAVENOUS

## 2015-11-25 MED ORDER — ONDANSETRON HCL 4 MG/2ML IJ SOLN
4.0000 mg | Freq: Once | INTRAMUSCULAR | Status: AC | PRN
  Administered 2015-11-25: 4 mg via INTRAVENOUS

## 2015-11-25 MED ORDER — OXYCODONE HCL 5 MG PO TABS
5.0000 mg | ORAL_TABLET | Freq: Once | ORAL | Status: AC | PRN
  Administered 2015-11-25: 5 mg via ORAL

## 2015-11-25 MED ORDER — METOCLOPRAMIDE HCL 5 MG/ML IJ SOLN
INTRAMUSCULAR | Status: DC | PRN
  Administered 2015-11-25: 10 mg via INTRAVENOUS

## 2015-11-25 MED ORDER — MIDAZOLAM HCL 5 MG/5ML IJ SOLN
INTRAMUSCULAR | Status: DC | PRN
  Administered 2015-11-25: 2 mg via INTRAVENOUS

## 2015-11-25 MED ORDER — ROCURONIUM BROMIDE 100 MG/10ML IV SOLN
INTRAVENOUS | Status: DC | PRN
  Administered 2015-11-25: 30 mg via INTRAVENOUS
  Administered 2015-11-25: 10 mg via INTRAVENOUS

## 2015-11-25 MED ORDER — SULFAMETHOXAZOLE-TRIMETHOPRIM 800-160 MG PO TABS: 1.0000 | ORAL_TABLET | Freq: Two times a day (BID) | ORAL | Status: DC

## 2015-11-25 MED ORDER — OXYCODONE HCL 5 MG/5ML PO SOLN: 5.0000 mg | Freq: Once | ORAL | Status: AC | PRN

## 2015-11-25 MED ORDER — PROMETHAZINE HCL 12.5 MG PO TABS: 12.5000 mg | ORAL_TABLET | Freq: Four times a day (QID) | ORAL | Status: DC | PRN

## 2015-11-25 MED ORDER — CLINDAMYCIN PHOSPHATE 900 MG/50ML IV SOLN
900.0000 mg | Freq: Once | INTRAVENOUS | Status: AC
  Administered 2015-11-25: 900 mg via INTRAVENOUS

## 2015-11-25 MED ORDER — SCOPOLAMINE 1 MG/3DAYS TD PT72
1.0000 | MEDICATED_PATCH | TRANSDERMAL | Status: DC
  Administered 2015-11-25: 1.5 mg via TRANSDERMAL

## 2015-11-25 MED ORDER — OXYMETAZOLINE HCL 0.05 % NA SOLN
NASAL | Status: DC | PRN
  Administered 2015-11-25: 1 via TOPICAL

## 2015-11-25 MED ORDER — GLYCOPYRROLATE 0.2 MG/ML IJ SOLN
INTRAMUSCULAR | Status: DC | PRN
  Administered 2015-11-25: 0.2 mg via INTRAVENOUS
  Administered 2015-11-25: 0.6 mg via INTRAVENOUS

## 2015-11-25 MED ORDER — PREDNISONE 10 MG (21) PO TBPK: ORAL_TABLET | ORAL | Status: DC

## 2015-11-25 MED ORDER — ACETAMINOPHEN 160 MG/5ML PO SOLN: 325.0000 mg | ORAL | Status: DC | PRN

## 2015-11-25 MED ORDER — SUCCINYLCHOLINE CHLORIDE 20 MG/ML IJ SOLN
INTRAMUSCULAR | Status: DC | PRN
  Administered 2015-11-25: 100 mg via INTRAVENOUS

## 2015-11-25 MED ORDER — OXYMETAZOLINE HCL 0.05 % NA SOLN
3.0000 | Freq: Once | NASAL | Status: AC
  Administered 2015-11-25: 3 via NASAL

## 2015-11-25 MED ORDER — FENTANYL CITRATE (PF) 100 MCG/2ML IJ SOLN
INTRAMUSCULAR | Status: DC | PRN
  Administered 2015-11-25: 100 ug via INTRAVENOUS

## 2015-11-25 MED ORDER — LACTATED RINGERS IV SOLN
INTRAVENOUS | Status: DC
  Administered 2015-11-25: 12:00:00 via INTRAVENOUS

## 2015-11-25 MED ORDER — ONDANSETRON HCL 4 MG/2ML IJ SOLN
INTRAMUSCULAR | Status: DC | PRN
  Administered 2015-11-25: 4 mg via INTRAVENOUS

## 2015-11-25 MED ORDER — HYDROMORPHONE HCL 1 MG/ML IJ SOLN
0.2500 mg | INTRAMUSCULAR | Status: DC | PRN
  Administered 2015-11-25: 0.5 mg via INTRAVENOUS

## 2015-11-25 MED ORDER — NEOSTIGMINE METHYLSULFATE 10 MG/10ML IV SOLN
INTRAVENOUS | Status: DC | PRN
  Administered 2015-11-25: 3 mg via INTRAVENOUS

## 2015-11-25 MED ORDER — LIDOCAINE-EPINEPHRINE 1 %-1:100000 IJ SOLN
INTRAMUSCULAR | Status: DC | PRN
  Administered 2015-11-25: 6.5 mL

## 2015-11-25 MED ORDER — ACETAMINOPHEN 325 MG PO TABS: 325.0000 mg | ORAL_TABLET | ORAL | Status: DC | PRN

## 2015-11-25 SURGICAL SUPPLY — 25 items
BALLOON SINUPLASTY SYSTEM (BALLOONS) ×3 IMPLANT
BATTERY INSTRU NAVIGATION (MISCELLANEOUS) ×12 IMPLANT
CANISTER SUCT 1200ML W/VALVE (MISCELLANEOUS) ×3 IMPLANT
COAG SUCT 10F 3.5MM HAND CTRL (MISCELLANEOUS) ×3 IMPLANT
DEVICE INFLATION SEID (MISCELLANEOUS) ×3 IMPLANT
DRAPE HEAD BAR (DRAPES) ×3 IMPLANT
DRESSING NASL FOAM PST OP SINU (MISCELLANEOUS) ×2 IMPLANT
DRSG NASAL FOAM POST OP SINU (MISCELLANEOUS) ×3
GLOVE BIO SURGEON STRL SZ7.5 (GLOVE) ×6 IMPLANT
IRRIGATOR 4MM STR (IRRIGATION / IRRIGATOR) ×3 IMPLANT
IV NS 500ML (IV SOLUTION) ×1
IV NS 500ML BAXH (IV SOLUTION) ×2 IMPLANT
KIT ROOM TURNOVER OR (KITS) ×3 IMPLANT
NAVIGATION MASK REG  ST (MISCELLANEOUS) ×3 IMPLANT
NS IRRIG 500ML POUR BTL (IV SOLUTION) ×3 IMPLANT
PACK DRAPE NASAL/ENT (PACKS) ×3 IMPLANT
PACKING NASAL EPIS 4X2.4 XEROG (MISCELLANEOUS) ×3 IMPLANT
PAD GROUND ADULT SPLIT (MISCELLANEOUS) ×3 IMPLANT
PATTIES SURGICAL .5 X3 (DISPOSABLE) ×3 IMPLANT
SET HANDPIECE IRR DIEGO (MISCELLANEOUS) ×3 IMPLANT
SOL ANTI-FOG 6CC FOG-OUT (MISCELLANEOUS) ×2 IMPLANT
SOL FOG-OUT ANTI-FOG 6CC (MISCELLANEOUS) ×1
STRAP BODY AND KNEE 60X3 (MISCELLANEOUS) ×6 IMPLANT
SYRINGE 10CC LL (SYRINGE) ×3 IMPLANT
WATER STERILE IRR 500ML POUR (IV SOLUTION) IMPLANT

## 2015-11-25 NOTE — Anesthesia Preprocedure Evaluation (Signed)
Anesthesia Evaluation  Patient identified by MRN, date of birth, ID band Patient awake    Reviewed: Allergy & Precautions, H&P , NPO status , Patient's Chart, lab work & pertinent test results, reviewed documented beta blocker date and time   Airway Mallampati: II  TM Distance: >3 FB Neck ROM: full    Dental no notable dental hx.    Pulmonary former smoker,    Pulmonary exam normal breath sounds clear to auscultation       Cardiovascular Exercise Tolerance: Good hypertension, On Medications  Rhythm:regular Rate:Normal     Neuro/Psych  Headaches, negative psych ROS   GI/Hepatic Neg liver ROS, GERD  Medicated,  Endo/Other  negative endocrine ROS  Renal/GU negative Renal ROS  negative genitourinary   Musculoskeletal   Abdominal   Peds  Hematology  (+) anemia ,   Anesthesia Other Findings   Reproductive/Obstetrics negative OB ROS                             Anesthesia Physical Anesthesia Plan  ASA: II  Anesthesia Plan: General   Post-op Pain Management:    Induction:   Airway Management Planned:   Additional Equipment:   Intra-op Plan:   Post-operative Plan:   Informed Consent: I have reviewed the patients History and Physical, chart, labs and discussed the procedure including the risks, benefits and alternatives for the proposed anesthesia with the patient or authorized representative who has indicated his/her understanding and acceptance.     Plan Discussed with: CRNA  Anesthesia Plan Comments:         Anesthesia Quick Evaluation

## 2015-11-25 NOTE — Op Note (Signed)
..11/25/2015  4:37 PM    Linda Rhodes  [id number]   Pre-Op Dx:  Chronic Rhinosinusitis refractory to medical treatment, Eustachian Tube Dysfunction  Post-op Dx: Same  Procedure:      1)  Image Guided Sinus Surgery,   2)  Right Maxillary Antrostomy with tissue removal   3)  Right Anterior Ethmoidectomy   4)  Right Frontal Sinusotomy  Surgeon:  Colvin Caroli:  General  EBL:  50  Complications:  None  Indications:  Findings: Severe edematous mucosa and significant purulence and polypoid degeneration of maxillary sinus with complete opacification.  Severe edema of anterior ethmoid sinus cells as well as frontal sinus outflow tract.  Friable tissue with uncinate and inferior portion of middle turbinate tissue very fragile.  Procedure: After the patient was identified in holding and the benefits of the procedure were reviewed as well as the consent and risks.  The patient was taken to the operating room and with the patient in a comfortable supine position,  general orotracheal anesthesia was induced without difficulty.  A proper time-out was performed.   Pre-operative Clindamycin had already been given.   The Stryker image guidance system was set up and calibrated in the normal fashion with an acceptable error of 0.80mm.       The patient next received preoperative Afrin spray for topical decongestion and vasoconstriction and 1% Xylocaine with 1:100,000 epinephrine, 6.5 cc's, was infiltrated into the right inferior turbinate, septum, and anterior middle turbinate on the right.  Several minutes were allowed for this to take effect.  Cottoniod pledgets soaked in Afrin were placed into both nasal cavities and left while the patient was prepped and draped in the standard fashion.  The materials were removed from the nose and observed to be intact and correct in number.  The nose was next inspected with a zero degree endoscope and the middle turbinate were medialized and afrin  soaked pledgets were placed lateral to the middle turbinates for approximately one minute.  Please see above findings.  At this time, attention was directed to the patient's right sinus cavities.  The uncinate process was infractured with a maxillary ostia seeker.  This was noted to be very friable and fell apart when brought forward.  This was removed in a piecemeal fashion.  At this time attention was directed to the patient's maxillary sinuse.  On the right, a ball tipped probe was placed through the natural ostia and this was used to create a larger opening.  Significant purulence under pressure erupted from the maxillary sinus ostia once this was opened.  A large surgical antrostomy was created with combination of ball tip probe, backbiting forceps as well as Diego microdebrider.  This demonstrated complete filling of the maxillary sinus with significant polypoid degeneration and inflammation of the tissues.  Polypoid tissue was removed with 45 upbiting forceps as well as straight biting forceps. And sent for pathology.  A culture was taken as well.  Hemostasis was performed with topical Afrin soaked pledgets.  Visualization with a 30 degree endoscope was used to examine the right maxillary antrostomy which was noted to be widely patent and in continuity with the natural os.  Next attention was directed to the patient's ethmoid sinuses.  The right ethmoid bulla was entered with a straight image guidance suction.  This was difficult to distinguish from the medial orbital wall due to severe edema obscuring landmarks.  The maxillary antrostomy was taken back to the posterior wall and this was  followed superiorly to identified the lamina.  Next the anterior ethmoid cells were opened from a medial and inferior position superior and laterally until the vertical lamella was encountered.  Significant edema of the mucosa was encountered but no significant purulence.  All fragments of bone and mucosa were removed  with straight biting forceps.  Image guidance was used throughout to ensure all diseased air cells were opened.  At this time attention was directed to the frontal sinus.  Using a 30 degree scope and ball tipped probe, the frontal sinus outflow tract was identified.  An Acclarent balloon sinuplasty device guidewire was placed into the frontal sinus and good transillumination was seen.  This was sequentially dialated X4.  Visualization was made of the outflow tract with polypoid and edematous mucosa present, this was gently removed from the anterior portion of the frontal sinus tract with upbiting forceps.  At this time with all planned sinuses opened, the patient's nasal cavity was examinated and copiously irrigated with sterile saline.  Meticulous hemostasis was continued and all sinuses were examined and noted to be widely patent.    Next, Stamberger sinufoam was placed into the patient's maxillary antrostomy as well as anterior ethmoid and frontal sinus outflow tract.  A half sheet of Xerogel was placed lateral to the middle turbinate remnant.  Care of the patient at this time was transferred to anesthesia and was extubated and taken to PACU in good condition.  Dispo:   PACU to home  Plan: Ice, elevation, narcotic analgesia and continue antibiotics and steroids.  We will reevaluate the patient in the office in 7 days.  Return to work in 7-10 days, strenuous activities in two weeks.   Linda Rhodes 11/25/2015 4:37 PM

## 2015-11-25 NOTE — H&P (Signed)
..  History and Physical paper copy reviewed and updated date of procedure and will be scanned into system.  

## 2015-11-25 NOTE — Anesthesia Postprocedure Evaluation (Signed)
Anesthesia Post Note  Patient: Ligia Renee Poulsen  Procedure(s) Performed: Procedure(s) (LRB): IMAGE GUIDED SINUS SURGERY WITH BALLOON (N/A) MAXILLARY ANTROSTOMY WITH TISSUE REMOVAL (Right)  ANTERIOR ETHMOIDECTOMY (Right) FRONTAL SINUSOTOMY (Right)  Patient location during evaluation: PACU Anesthesia Type: General Level of consciousness: awake and alert Pain management: pain level controlled Vital Signs Assessment: post-procedure vital signs reviewed and stable Respiratory status: spontaneous breathing, nonlabored ventilation and respiratory function stable Cardiovascular status: blood pressure returned to baseline and stable Postop Assessment: no signs of nausea or vomiting Anesthetic complications: no    DANIEL D KOVACS

## 2015-11-25 NOTE — Anesthesia Procedure Notes (Signed)
Procedure Name: Intubation Date/Time: 11/25/2015 3:13 PM Performed by: Londell Moh Pre-anesthesia Checklist: Patient identified, Emergency Drugs available, Suction available, Patient being monitored and Timeout performed Patient Re-evaluated:Patient Re-evaluated prior to inductionOxygen Delivery Method: Circle system utilized Preoxygenation: Pre-oxygenation with 100% oxygen Intubation Type: IV induction Ventilation: Mask ventilation without difficulty Laryngoscope Size: Mac and 3 Grade View: Grade III Tube type: Oral Rae Tube size: 7.0 mm Number of attempts: 1 Placement Confirmation: ETT inserted through vocal cords under direct vision,  positive ETCO2 and breath sounds checked- equal and bilateral Tube secured with: Tape Dental Injury: Teeth and Oropharynx as per pre-operative assessment  Difficulty Due To: Difficult Airway- due to dentition

## 2015-11-25 NOTE — Transfer of Care (Signed)
Immediate Anesthesia Transfer of Care Note  Patient: Linda Rhodes  Procedure(s) Performed: Procedure(s): IMAGE GUIDED SINUS SURGERY WITH BALLOON (N/A) MAXILLARY ANTROSTOMY WITH TISSUE REMOVAL (Right)  ANTERIOR ETHMOIDECTOMY (Right) FRONTAL SINUSOTOMY (Right)  Patient Location: PACU  Anesthesia Type: General  Level of Consciousness: awake, alert  and patient cooperative  Airway and Oxygen Therapy: Patient Spontanous Breathing and Patient connected to supplemental oxygen  Post-op Assessment: Post-op Vital signs reviewed, Patient's Cardiovascular Status Stable, Respiratory Function Stable, Patent Airway and No signs of Nausea or vomiting  Post-op Vital Signs: Reviewed and stable  Complications: No apparent anesthesia complications

## 2015-11-26 ENCOUNTER — Encounter: Payer: Self-pay | Admitting: Otolaryngology

## 2015-11-27 ENCOUNTER — Ambulatory Visit: Payer: BLUE CROSS/BLUE SHIELD | Admitting: Family Medicine

## 2015-11-27 LAB — SURGICAL PATHOLOGY

## 2016-01-07 ENCOUNTER — Ambulatory Visit (INDEPENDENT_AMBULATORY_CARE_PROVIDER_SITE_OTHER): Payer: BLUE CROSS/BLUE SHIELD | Admitting: Family Medicine

## 2016-01-07 ENCOUNTER — Encounter: Payer: Self-pay | Admitting: Family Medicine

## 2016-01-07 VITALS — BP 120/80 | HR 70 | Ht 67.0 in | Wt 148.0 lb

## 2016-01-07 DIAGNOSIS — B029 Zoster without complications: Secondary | ICD-10-CM

## 2016-01-07 MED ORDER — TRAMADOL HCL 50 MG PO TABS: 50.0000 mg | ORAL_TABLET | Freq: Three times a day (TID) | ORAL | Status: DC | PRN

## 2016-01-07 MED ORDER — VALACYCLOVIR HCL 1 G PO TABS: 1000.0000 mg | ORAL_TABLET | Freq: Three times a day (TID) | ORAL | Status: DC

## 2016-01-07 NOTE — Progress Notes (Signed)
Name: Linda Rhodes   MRN: [id number]    DOB: 04-May-1976   Date:01/07/2016       Progress Note  Subjective  Chief Complaint  Chief Complaint  Patient presents with  . Herpes Zoster    around L) collar bone- started Monday with pain    Arm Pain  There was no injury mechanism. The pain is present in the left shoulder. The quality of the pain is described as burning. The pain does not radiate. Pertinent negatives include no chest pain, muscle weakness, numbness or tingling. Nothing aggravates the symptoms. She has tried acetaminophen for the symptoms. The treatment provided no relief.    No problem-specific assessment & plan notes found for this encounter.   Past Medical History  Diagnosis Date  . Bladder infection   . Ovarian cyst   . GERD (gastroesophageal reflux disease)   . Hypertension   . Headache     sinus  . Anemia     in past, prior to removal of uterine fibroids  . Motion sickness     all moving vehicles    Past Surgical History  Procedure Laterality Date  . Cholecystectomy    . Cesarean section    . Laparoscopic hysterectomy N/A 05/06/2015    Procedure: HYSTERECTOMY TOTAL LAPAROSCOPIC;  Surgeon: Aletha Halim, MD;  Location: [facility];  Service: Gynecology;  Laterality: N/A;  . Cystoscopy N/A 05/06/2015    Procedure: CYSTOSCOPY;  Surgeon: Aletha Halim, MD;  Location: [facility];  Service: Gynecology;  Laterality: N/A;  . Back surgery  2016    [facility] - ruptured L4-5  . Image guided sinus surgery N/A 11/25/2015    Procedure: IMAGE GUIDED SINUS SURGERY WITH BALLOON;  Surgeon: Carloyn Manner, MD;  Location: [place];  Service: ENT;  Laterality: N/A;  . Maxillary antrostomy Right 11/25/2015    Procedure: MAXILLARY ANTROSTOMY WITH TISSUE REMOVAL;  Surgeon: Carloyn Manner, MD;  Location: [place];  Service: ENT;  Laterality: Right;  . Ethmoidectomy Right 11/25/2015    Procedure:  ANTERIOR ETHMOIDECTOMY;  Surgeon: Carloyn Manner, MD;  Location: [place];  Service: ENT;  Laterality: Right;  . Frontal sinus exploration Right 11/25/2015    Procedure: FRONTAL SINUSOTOMY;  Surgeon: Carloyn Manner, MD;  Location: [place];  Service: ENT;  Laterality: Right;    Family History  Problem Relation Age of Onset  . Diabetes Mother   . Hypertension Mother   . Diabetes Father   . Hypertension Father     Social History   Social History  . Marital Status: Single    Spouse Name: N/A  . Number of Children: N/A  . Years of Education: N/A   Occupational History  . Not on file.   Social History Main Topics  . Smoking status: Former Smoker -- 0.50 packs/day for 7 years    Types: Cigarettes    Quit date: 10/19/2015  . Smokeless tobacco: Not on file  . Alcohol Use: Yes     Comment: socially - 1x/mo  . Drug Use: No  . Sexual Activity: Yes   Other Topics Concern  . Not on file   Social History Narrative    Allergies  Allergen Reactions  . Adhesive [Tape] Rash    And skin tears from stronger tapes     Review of Systems  Constitutional: Negative for fever, chills, weight loss and malaise/fatigue.  HENT: Negative for ear discharge, ear pain and sore throat.   Eyes: Negative for blurred vision.  Respiratory: Negative for cough, sputum production, shortness of breath and wheezing.   Cardiovascular: Negative for chest pain, palpitations and leg swelling.  Gastrointestinal: Negative for heartburn, nausea, abdominal pain, diarrhea, constipation, blood in stool and melena.  Genitourinary: Negative for dysuria, urgency, frequency and hematuria.  Musculoskeletal: Negative for myalgias, back pain, joint pain and neck pain.  Skin: Negative for rash.  Neurological: Negative for dizziness, tingling, sensory change, focal weakness, numbness and headaches.  Endo/Heme/Allergies: Negative for environmental allergies and polydipsia. Does not bruise/bleed easily.  Psychiatric/Behavioral: Negative for  depression and suicidal ideas. The patient is not nervous/anxious and does not have insomnia.      Objective  Filed Vitals:   01/07/16 0926  BP: 120/80  Pulse: 70  Height: 5\' 7"  (1.702 m)  Weight: 148 lb (67.132 kg)    Physical Exam  Constitutional: She is well-developed, well-nourished, and in no distress. No distress.  HENT:  Head: Normocephalic and atraumatic.  Right Ear: External ear normal.  Left Ear: External ear normal.  Nose: Nose normal.  Mouth/Throat: Oropharynx is clear and moist.  Eyes: Conjunctivae and EOM are normal. Pupils are equal, round, and reactive to light. Right eye exhibits no discharge. Left eye exhibits no discharge.  Neck: Normal range of motion. Neck supple. No JVD present. No thyromegaly present.  Cardiovascular: Normal rate, regular rhythm, normal heart sounds and intact distal pulses.  Exam reveals no gallop and no friction rub.   No murmur heard. Pulmonary/Chest: Effort normal and breath sounds normal.  Abdominal: Soft. Bowel sounds are normal. She exhibits no mass. There is no tenderness. There is no guarding.  Musculoskeletal: Normal range of motion. She exhibits no edema.  Lymphadenopathy:    She has no cervical adenopathy.  Neurological: She is alert. She has normal reflexes.  Skin: Skin is warm and dry. She is not diaphoretic. There is erythema.  Psychiatric: Mood and affect normal.  Nursing note and vitals reviewed.     Assessment & Plan  Problem List Items Addressed This Visit    None    Visit Diagnoses    Zoster    -  Primary    Relevant Medications    valACYclovir (VALTREX) 1000 MG tablet    traMADol (ULTRAM) 50 MG tablet         Dr. Macon [facility]  01/07/2016

## 2016-02-10 ENCOUNTER — Encounter: Payer: Self-pay | Admitting: *Deleted

## 2016-02-10 ENCOUNTER — Ambulatory Visit
Admission: EM | Admit: 2016-02-10 | Discharge: 2016-02-10 | Disposition: A | Discharge status: Home / Self Care | Payer: BLUE CROSS/BLUE SHIELD | Attending: Family Medicine | Admitting: Family Medicine

## 2016-02-10 DIAGNOSIS — G43009 Migraine without aura, not intractable, without status migrainosus: Secondary | ICD-10-CM | POA: Diagnosis not present

## 2016-02-10 MED ORDER — KETOROLAC TROMETHAMINE 60 MG/2ML IM SOLN
60.0000 mg | Freq: Once | INTRAMUSCULAR | Status: AC
  Administered 2016-02-10: 60 mg via INTRAMUSCULAR

## 2016-02-10 MED ORDER — NAPROXEN 500 MG PO TABS: 500.0000 mg | ORAL_TABLET | Freq: Two times a day (BID) | ORAL | Status: DC

## 2016-02-10 MED ORDER — ONDANSETRON 8 MG PO TBDP: 8.0000 mg | ORAL_TABLET | Freq: Two times a day (BID) | ORAL | Status: DC

## 2016-02-10 MED ORDER — ONDANSETRON 8 MG PO TBDP
8.0000 mg | ORAL_TABLET | Freq: Once | ORAL | Status: AC
  Administered 2016-02-10: 8 mg via ORAL

## 2016-02-10 NOTE — Discharge Instructions (Signed)

## 2016-02-10 NOTE — ED Notes (Signed)
Awoke with headache, n/v, diaphoresis, chills this am. States recent oral surgery for tooth extraction.

## 2016-02-10 NOTE — ED Provider Notes (Signed)
CSN: [id number]     Arrival date & time 02/10/16  1703 History   First MD Initiated Contact with Patient 02/10/16 1941     Chief Complaint  Patient presents with  . Headache  . Nausea  . Emesis  . Chills   (Consider location/radiation/quality/duration/timing/severity/associated sxs/prior Treatment) HPI  Is a 40 year old female who states that she awoke with a headache nausea vomiting diaphoresis and chills this morning. She is currently taking amoxicillin 500 mg 3 times a day after having had a tooth extraction approximately one week ago. He sates that she does have migraines in the past and this seems very similar to the migraine. She is able to abort them by taking Tylenol but this time it has not been so. She says this a very painful headache she states it is not the worst she's ever had. She does have some mild photosensitivity. Her last episode of vomiting was about 4:30 this afternoon. She is afebrile 76 respirations 16 blood pressure 149/104 and O2 sats 100% on room air.      Past Medical History  Diagnosis Date  . Bladder infection   . Ovarian cyst   . GERD (gastroesophageal reflux disease)   . Hypertension   . Headache     sinus  . Anemia     in past, prior to removal of uterine fibroids  . Motion sickness     all moving vehicles   Past Surgical History  Procedure Laterality Date  . Cholecystectomy    . Cesarean section    . Laparoscopic hysterectomy N/A 05/06/2015    Procedure: HYSTERECTOMY TOTAL LAPAROSCOPIC;  Surgeon: Aletha Halim, MD;  Location: [facility];  Service: Gynecology;  Laterality: N/A;  . Cystoscopy N/A 05/06/2015    Procedure: CYSTOSCOPY;  Surgeon: Aletha Halim, MD;  Location: [facility];  Service: Gynecology;  Laterality: N/A;  . Back surgery  2016    [facility] - ruptured L4-5  . Image guided sinus surgery N/A 11/25/2015    Procedure: IMAGE GUIDED SINUS SURGERY WITH BALLOON;  Surgeon: Carloyn Manner, MD;  Location: [place];   Service: ENT;  Laterality: N/A;  . Maxillary antrostomy Right 11/25/2015    Procedure: MAXILLARY ANTROSTOMY WITH TISSUE REMOVAL;  Surgeon: Carloyn Manner, MD;  Location: [place];  Service: ENT;  Laterality: Right;  . Ethmoidectomy Right 11/25/2015    Procedure:  ANTERIOR ETHMOIDECTOMY;  Surgeon: Carloyn Manner, MD;  Location: [place];  Service: ENT;  Laterality: Right;  . Frontal sinus exploration Right 11/25/2015    Procedure: FRONTAL SINUSOTOMY;  Surgeon: Carloyn Manner, MD;  Location: [place];  Service: ENT;  Laterality: Right;   Family History  Problem Relation Age of Onset  . Diabetes Mother   . Hypertension Mother   . Diabetes Father   . Hypertension Father    Social History  Substance Use Topics  . Smoking status: Former Smoker -- 0.50 packs/day for 7 years    Types: Cigarettes    Quit date: 10/19/2015  . Smokeless tobacco: None  . Alcohol Use: Yes     Comment: socially - 1x/mo   OB History    No data available     Review of Systems  Constitutional: Positive for chills and activity change. Negative for fever.  Neurological: Positive for headaches. Negative for dizziness, tremors, seizures, syncope, facial asymmetry, speech difficulty, weakness, light-headedness and numbness.  All other systems reviewed and are negative.   Allergies  Adhesive  Home Medications   Prior to  Admission medications   Medication Sig Start Date End Date Taking? Authorizing Provider  fluticasone (FLONASE) 50 MCG/ACT nasal spray Place 2 sprays into both nostrils daily. 11/25/15  Yes Carloyn Manner, MD  hydrochlorothiazide (HYDRODIURIL) 25 MG tablet Take 1 tablet (25 mg total) by mouth daily. 11/13/15  Yes Juline Patch, MD  NICOTINE STEP 1 21 MG/24HR patch PLACE 1 PATCH (21 MG TOTAL) ONTO THE SKIN DAILY. 09/26/15  Yes Juline Patch, MD  Docusate Calcium (STOOL SOFTENER PO) Take by mouth as needed.    Historical Provider, MD  naproxen (NAPROSYN) 500 MG  tablet Take 1 tablet (500 mg total) by mouth 2 (two) times daily with a meal. 02/10/16   Lorin Picket, PA-C  ondansetron (ZOFRAN ODT) 8 MG disintegrating tablet Take 1 tablet (8 mg total) by mouth 2 (two) times daily. 02/10/16   Lorin Picket, PA-C  predniSONE (STERAPRED UNI-PAK 21 TAB) 10 MG (21) TBPK tablet sterapred DS 6 day taper 11/25/15   Carloyn Manner, MD  promethazine (PHENERGAN) 12.5 MG tablet Take 1 tablet (12.5 mg total) by mouth every 6 (six) hours as needed for nausea or vomiting. 11/25/15   Carloyn Manner, MD  traMADol (ULTRAM) 50 MG tablet Take 1 tablet (50 mg total) by mouth every 8 (eight) hours as needed. 01/07/16   Juline Patch, MD  valACYclovir (VALTREX) 1000 MG tablet Take 1 tablet (1,000 mg total) by mouth 3 (three) times daily. 01/07/16   Juline Patch, MD   Meds Ordered and Administered this Visit   Medications  ketorolac (TORADOL) injection 60 mg (60 mg Intramuscular Given 02/10/16 2015)  ondansetron (ZOFRAN-ODT) disintegrating tablet 8 mg (8 mg Oral Given 02/10/16 2014)    BP 149/104 mmHg  Pulse 76  Temp(Src) 98.2 F (36.8 C) (Oral)  Resp 16  Ht 5\' 7"  (1.702 m)  Wt 145 lb (65.772 kg)  BMI 22.71 kg/m2  SpO2 100%  LMP  (LMP Unknown) No data found.   Physical Exam  Constitutional: She is oriented to person, place, and time. She appears well-developed and well-nourished. No distress.  HENT:  Head: Normocephalic and atraumatic.  Right Ear: External ear normal.  Left Ear: External ear normal.  Eyes: Conjunctivae and EOM are normal. Pupils are equal, round, and reactive to light. Right eye exhibits no discharge. Left eye exhibits no discharge.  Neck: Normal range of motion. Neck supple.  Pulmonary/Chest: Effort normal and breath sounds normal.  Musculoskeletal: Normal range of motion. She exhibits no edema or tenderness.  Lymphadenopathy:    She has no cervical adenopathy.  Neurological: She is alert and oriented to person, place, and time. She has  normal reflexes. She displays normal reflexes. No cranial nerve deficit. She exhibits normal muscle tone. Coordination normal.  Skin: Skin is warm and dry. She is not diaphoretic.  Psychiatric: She has a normal mood and affect. Her behavior is normal. Judgment and thought content normal.  Nursing note and vitals reviewed.   ED Course  Procedures (including critical care time)  Labs Review Labs Reviewed - No data to display  Imaging Review No results found.   Visual Acuity Review  Right Eye Distance:   Left Eye Distance:   Bilateral Distance:    Right Eye Near:   Left Eye Near:    Bilateral Near:         MDM   1. Migraine without aura and without status migrainosus, not intractable    Discharge Medication List as of 02/10/2016  8:36  PM    START taking these medications   Details  naproxen (NAPROSYN) 500 MG tablet Take 1 tablet (500 mg total) by mouth 2 (two) times daily with a meal., Starting 02/10/2016, Until Discontinued, Normal    ondansetron (ZOFRAN ODT) 8 MG disintegrating tablet Take 1 tablet (8 mg total) by mouth 2 (two) times daily., Starting 02/10/2016, Until Discontinued, Normal      Plan: 1. Test/x-ray results and diagnosis reviewed with patient 2. rx as per orders; risks, benefits, potential side effects reviewed with patient 3. Recommend supportive treatment with Use of nonsteroidal anti-inflammatories at the first symptom or sign of an impending headache. She should follow-up with her primary care physician if she continues to have problems. 4. F/u prn if symptoms worsen or don't improve     Lorin Picket, PA-C 02/10/16 2041

## 2016-03-25 ENCOUNTER — Other Ambulatory Visit: Payer: Self-pay | Admitting: Orthopedic Surgery

## 2016-03-25 DIAGNOSIS — M5417 Radiculopathy, lumbosacral region: Secondary | ICD-10-CM

## 2016-03-25 DIAGNOSIS — M5441 Lumbago with sciatica, right side: Secondary | ICD-10-CM

## 2016-04-14 ENCOUNTER — Ambulatory Visit
Admission: RE | Admit: 2016-04-14 | Discharge: 2016-04-14 | Disposition: A | Discharge status: Home / Self Care | Payer: BLUE CROSS/BLUE SHIELD | Source: Ambulatory Visit | Attending: Orthopedic Surgery | Admitting: Orthopedic Surgery

## 2016-04-14 DIAGNOSIS — Z9889 Other specified postprocedural states: Secondary | ICD-10-CM | POA: Insufficient documentation

## 2016-04-14 DIAGNOSIS — Z8739 Personal history of other diseases of the musculoskeletal system and connective tissue: Secondary | ICD-10-CM | POA: Diagnosis present

## 2016-04-14 DIAGNOSIS — M4806 Spinal stenosis, lumbar region: Secondary | ICD-10-CM | POA: Diagnosis not present

## 2016-04-14 DIAGNOSIS — M5441 Lumbago with sciatica, right side: Secondary | ICD-10-CM | POA: Diagnosis present

## 2016-04-14 DIAGNOSIS — M5417 Radiculopathy, lumbosacral region: Secondary | ICD-10-CM | POA: Diagnosis present

## 2016-04-14 DIAGNOSIS — M5126 Other intervertebral disc displacement, lumbar region: Secondary | ICD-10-CM | POA: Diagnosis not present

## 2016-04-23 ENCOUNTER — Other Ambulatory Visit: Payer: Self-pay

## 2016-04-23 DIAGNOSIS — B029 Zoster without complications: Secondary | ICD-10-CM

## 2016-04-23 MED ORDER — VALACYCLOVIR HCL 1 G PO TABS: 1000.0000 mg | ORAL_TABLET | Freq: Three times a day (TID) | ORAL | Status: DC

## 2016-04-27 ENCOUNTER — Encounter: Payer: BLUE CROSS/BLUE SHIELD | Admitting: Family Medicine

## 2016-04-28 ENCOUNTER — Encounter: Payer: Self-pay | Admitting: Family Medicine

## 2016-04-28 ENCOUNTER — Ambulatory Visit (INDEPENDENT_AMBULATORY_CARE_PROVIDER_SITE_OTHER): Payer: BLUE CROSS/BLUE SHIELD | Admitting: Family Medicine

## 2016-04-28 VITALS — BP 100/64 | HR 68 | Ht 67.0 in | Wt 142.0 lb

## 2016-04-28 DIAGNOSIS — R519 Headache, unspecified: Secondary | ICD-10-CM

## 2016-04-28 DIAGNOSIS — R51 Headache: Secondary | ICD-10-CM | POA: Diagnosis not present

## 2016-04-28 DIAGNOSIS — I10 Essential (primary) hypertension: Secondary | ICD-10-CM

## 2016-04-28 MED ORDER — HYDROCHLOROTHIAZIDE 12.5 MG PO TABS: 12.5000 mg | ORAL_TABLET | Freq: Every day | ORAL | Status: DC

## 2016-04-28 NOTE — Progress Notes (Signed)
Name: Linda Rhodes   MRN: [id number]    DOB: [birth year]   Date:04/28/2016       Progress Note  Subjective  Chief Complaint  Chief Complaint  Patient presents with  . Hypertension    needs to get back on HCTZ    Hypertension This is a chronic problem. The current episode started more than 1 year ago. The problem has been gradually improving since onset. The problem is controlled. Pertinent negatives include no anxiety, blurred vision, chest pain, headaches, malaise/fatigue, neck pain, orthopnea, palpitations, peripheral edema, PND, shortness of breath or sweats. There are no associated agents to hypertension. There are no known risk factors for coronary artery disease. Past treatments include diuretics. The current treatment provides mild improvement. There are no compliance problems.  There is no history of angina, kidney disease, CAD/MI, CVA, heart failure, left ventricular hypertrophy, PVD, renovascular disease or retinopathy. There is no history of chronic renal disease or a hypertension causing med.  Headache  This is a recurrent problem. The problem occurs intermittently (every other day). The problem has been waxing and waning. The pain is located in the right unilateral and retro-orbital region. The pain does not radiate. The quality of the pain is described as throbbing. The pain is at a severity of 10/10. The pain is severe. Associated symptoms include eye watering, nausea, phonophobia, photophobia, sinus pressure and tinnitus. Pertinent negatives include no abdominal pain, abnormal behavior, anorexia, back pain, blurred vision, coughing, dizziness, drainage, ear pain, eye pain, eye redness, facial sweating, fever, hearing loss, insomnia, loss of balance, muscle aches, neck pain, numbness, rhinorrhea, scalp tenderness, seizures, sore throat, swollen glands, tingling, visual change, vomiting, weakness or weight loss. The symptoms are aggravated by bright light. She has tried  acetaminophen, NSAIDs and oral narcotics for the symptoms. The treatment provided no relief. Her past medical history is significant for hypertension.    No problem-specific assessment & plan notes found for this encounter.   Past Medical History  Diagnosis Date  . Bladder infection   . Ovarian cyst   . GERD (gastroesophageal reflux disease)   . Hypertension   . Headache     sinus  . Anemia     in past, prior to removal of uterine fibroids  . Motion sickness     all moving vehicles    Past Surgical History  Procedure Laterality Date  . Cholecystectomy    . Cesarean section    . Laparoscopic hysterectomy N/A 05/06/2015    Procedure: HYSTERECTOMY TOTAL LAPAROSCOPIC;  Surgeon: Aletha Halim, MD;  Location: [facility];  Service: Gynecology;  Laterality: N/A;  . Cystoscopy N/A 05/06/2015    Procedure: CYSTOSCOPY;  Surgeon: Aletha Halim, MD;  Location: [facility];  Service: Gynecology;  Laterality: N/A;  . Back surgery  2016    [facility] - ruptured L4-5  . Image guided sinus surgery N/A 11/25/2015    Procedure: IMAGE GUIDED SINUS SURGERY WITH BALLOON;  Surgeon: Carloyn Manner, MD;  Location: [place];  Service: ENT;  Laterality: N/A;  . Maxillary antrostomy Right 11/25/2015    Procedure: MAXILLARY ANTROSTOMY WITH TISSUE REMOVAL;  Surgeon: Carloyn Manner, MD;  Location: [place];  Service: ENT;  Laterality: Right;  . Ethmoidectomy Right 11/25/2015    Procedure:  ANTERIOR ETHMOIDECTOMY;  Surgeon: Carloyn Manner, MD;  Location: [place];  Service: ENT;  Laterality: Right;  . Frontal sinus exploration Right 11/25/2015    Procedure: FRONTAL SINUSOTOMY;  Surgeon: Carloyn Manner, MD;  [facility];  Service: ENT;  Laterality: Right;    Family History  Problem Relation Age of Onset  . Diabetes Mother   . Hypertension Mother   . Diabetes Father   . Hypertension Father     Social History   Social History  . Marital  Status: Single    Spouse Name: N/A  . Number of Children: N/A  . Years of Education: N/A   Occupational History  . Not on file.   Social History Main Topics  . Smoking status: Former Smoker -- 0.50 packs/day for 7 years    Types: Cigarettes    Quit date: 10/19/2015  . Smokeless tobacco: Not on file  . Alcohol Use: Yes     Comment: socially - 1x/mo  . Drug Use: No  . Sexual Activity: Yes   Other Topics Concern  . Not on file   Social History Narrative    Allergies  Allergen Reactions  . Adhesive [Tape] Rash    And skin tears from stronger tapes     Review of Systems  Constitutional: Negative for fever, chills, weight loss and malaise/fatigue.  HENT: Positive for sinus pressure and tinnitus. Negative for ear discharge, ear pain, hearing loss, rhinorrhea and sore throat.   Eyes: Positive for photophobia. Negative for blurred vision, pain and redness.  Respiratory: Negative for cough, sputum production, shortness of breath and wheezing.   Cardiovascular: Negative for chest pain, palpitations, orthopnea, leg swelling and PND.  Gastrointestinal: Positive for nausea. Negative for heartburn, vomiting, abdominal pain, diarrhea, constipation, blood in stool, melena and anorexia.  Genitourinary: Negative for dysuria, urgency, frequency and hematuria.  Musculoskeletal: Negative for myalgias, back pain, joint pain and neck pain.  Skin: Negative for rash.  Neurological: Negative for dizziness, tingling, sensory change, focal weakness, seizures, weakness, numbness, headaches and loss of balance.  Endo/Heme/Allergies: Negative for environmental allergies and polydipsia. Does not bruise/bleed easily.  Psychiatric/Behavioral: Negative for depression and suicidal ideas. The patient is not nervous/anxious and does not have insomnia.      Objective  Filed Vitals:   04/28/16 0836  BP: 100/64  Pulse: 68  Height: 5\' 7"  (1.702 m)  Weight: 142 lb (64.411 kg)    Physical Exam   Constitutional: She is well-developed, well-nourished, and in no distress. No distress.  HENT:  Head: Normocephalic and atraumatic.  Right Ear: External ear normal.  Left Ear: External ear normal.  Nose: Nose normal.  Mouth/Throat: Oropharynx is clear and moist.  Eyes: Conjunctivae and EOM are normal. Pupils are equal, round, and reactive to light. Right eye exhibits no discharge. Left eye exhibits no discharge.  Neck: Normal range of motion. Neck supple. No JVD present. No thyromegaly present.  Cardiovascular: Normal rate, regular rhythm, normal heart sounds and intact distal pulses.  Exam reveals no gallop and no friction rub.   No murmur heard. Pulmonary/Chest: Effort normal and breath sounds normal.  Abdominal: Soft. Bowel sounds are normal. She exhibits no mass. There is no tenderness. There is no guarding.  Musculoskeletal: Normal range of motion. She exhibits no edema.  Lymphadenopathy:    She has no cervical adenopathy.  Neurological: She is alert. She has normal reflexes and intact cranial nerves. She displays weakness. A sensory deficit is present. No cranial nerve deficit. She has an abnormal Straight Leg Raise Test.  Decrease motor/sensory to right leg  Skin: Skin is warm and dry. She is not diaphoretic.  Psychiatric: Mood and affect normal.  Nursing note and vitals reviewed.  Assessment & Plan  Problem List Items Addressed This Visit    None    Visit Diagnoses    Essential hypertension    -  Primary    Relevant Medications    hydrochlorothiazide (HYDRODIURIL) 12.5 MG tablet    Headache, unspecified headache type        ?migraine variant    Relevant Medications    cyclobenzaprine (FLEXERIL) 10 MG tablet    oxyCODONE (OXY IR/ROXICODONE) 5 MG immediate release tablet    Other Relevant Orders    Ambulatory referral to Neurology         Dr. Otilio Miu [facility]  04/28/2016

## 2016-04-28 NOTE — Patient Instructions (Signed)

## 2016-08-04 DIAGNOSIS — G43719 Chronic migraine without aura, intractable, without status migrainosus: Secondary | ICD-10-CM | POA: Insufficient documentation

## 2016-08-11 ENCOUNTER — Other Ambulatory Visit: Payer: Self-pay | Admitting: Neurology

## 2016-08-11 DIAGNOSIS — R519 Headache, unspecified: Secondary | ICD-10-CM

## 2016-08-11 DIAGNOSIS — R51 Headache: Principal | ICD-10-CM

## 2016-08-27 ENCOUNTER — Ambulatory Visit
Admission: RE | Admit: 2016-08-27 | Discharge: 2016-08-27 | Disposition: A | Discharge status: Home / Self Care | Payer: BLUE CROSS/BLUE SHIELD | Source: Ambulatory Visit | Attending: Neurology | Admitting: Neurology

## 2016-08-27 DIAGNOSIS — R519 Headache, unspecified: Secondary | ICD-10-CM

## 2016-08-27 DIAGNOSIS — R51 Headache: Secondary | ICD-10-CM | POA: Diagnosis not present

## 2016-08-27 LAB — POCT I-STAT CREATININE: Creatinine, Ser: 0.8 mg/dL (ref 0.44–1.00)

## 2016-08-27 MED ORDER — GADOBENATE DIMEGLUMINE 529 MG/ML IV SOLN
15.0000 mL | Freq: Once | INTRAVENOUS | Status: AC | PRN
  Administered 2016-08-27: 13 mL via INTRAVENOUS

## 2016-09-24 DIAGNOSIS — K047 Periapical abscess without sinus: Secondary | ICD-10-CM

## 2016-09-24 HISTORY — DX: Periapical abscess without sinus: K04.7

## 2016-09-27 ENCOUNTER — Encounter
Admission: RE | Admit: 2016-09-27 | Discharge: 2016-09-27 | Disposition: A | Discharge status: Home / Self Care | Payer: BLUE CROSS/BLUE SHIELD | Source: Ambulatory Visit | Attending: Neurological Surgery | Admitting: Neurological Surgery

## 2016-09-27 ENCOUNTER — Inpatient Hospital Stay: Admission: RE | Admit: 2016-09-27 | Payer: BLUE CROSS/BLUE SHIELD | Source: Ambulatory Visit

## 2016-09-27 DIAGNOSIS — D649 Anemia, unspecified: Secondary | ICD-10-CM

## 2016-09-27 DIAGNOSIS — Z01818 Encounter for other preprocedural examination: Secondary | ICD-10-CM

## 2016-09-27 DIAGNOSIS — I10 Essential (primary) hypertension: Secondary | ICD-10-CM | POA: Insufficient documentation

## 2016-09-27 DIAGNOSIS — Z01811 Encounter for preprocedural respiratory examination: Secondary | ICD-10-CM

## 2016-09-27 HISTORY — DX: Periapical abscess without sinus: K04.7

## 2016-09-27 HISTORY — DX: Other specified postprocedural states: Z98.890

## 2016-09-27 HISTORY — DX: Failed or difficult intubation, initial encounter: T88.4XXA

## 2016-09-27 HISTORY — DX: Nausea with vomiting, unspecified: R11.2

## 2016-09-27 NOTE — Pre-Procedure Instructions (Signed)
Spoke with Dr. Sedalia Muta nurse Ophelia Shoulder, Dr. Aris Lot called her and said to add a sed rate and c-reactive protein to pt's blood work and the pt must have a letter from dentist stating that the tooth infection is cleared, surgery will proceed on 10/04/16.  This RN will call pt with above information.

## 2016-09-27 NOTE — Pre-Procedure Instructions (Signed)
Spoke with pt regarding having lab, EKG and CXR on 09/29/16 here at [facility] and getting a letter from here dentist on Friday 10/01/16 faxed to Dr. Sedalia Muta office saying tooth abscess is cleared and lab results are OK, pt's back surgery can proceed on Monday 10/04/16.  Pt agreed to above.

## 2016-09-27 NOTE — Patient Instructions (Signed)
  Your procedure is scheduled on:Dec. 18 , 2017. Report to Same Day Surgery. To find out your arrival time please call [phone] between 1PM - 3PM on Dec. 15, 2017.  Remember: Instructions that are not followed completely may result in serious medical risk, up to and including death, or upon the discretion of your surgeon and anesthesiologist your surgery may need to be rescheduled.    _x___ 1. Do not eat food or drink liquids after midnight. No gum chewing or hard candies.     __x__ 2. No Alcohol for 24 hours before or after surgery.   ____ 3. Bring all medications with you on the day of surgery if instructed.    __x__ 4. Notify your doctor if there is any change in your medical condition     (cold, fever, infections).    _____ 5. No smoking 24 hours prior to surgery.     Do not wear jewelry, make-up, hairpins, clips or nail polish.  Do not wear lotions, powders, or perfumes.   Do not shave 48 hours prior to surgery. Men may shave face and neck.  Do not bring valuables to the hospital.    [facility] is not responsible for any belongings or valuables.               Contacts, dentures or bridgework may not be worn into surgery.  Leave your suitcase in the car. After surgery it may be brought to your room.  For patients admitted to the hospital, discharge time is determined by your treatment team.   Patients discharged the day of surgery will not be allowed to drive home.    Please read over the following fact sheets that you were given:   [facility] Preparing for Surgery  __x__ Take these medicines the morning of surgery with A SIP OF WATER:    1. metoprolol succinate (TOPROL-XL)   2.   3.   4.  5.  6.  ____ Fleet Enema (as directed)   __x__ Use CHG Soap as directed on instruction sheet  ____ Use inhalers on the day of surgery and bring to hospital day of surgery  ____ Stop metformin 2 days prior to surgery    ____ Take 1/2 of usual insulin dose the night before  surgery and none on the morning of  surgery.   ____ Stop Coumadin/Plavix/aspirin on does not apply.  _x__ Stop Anti-inflammatories such as Advil, Aleve, Ibuprofen, Motrin, Naproxen, Naprosyn, Goodies powders or aspirin products. OK to take Tylenol or Tramadol.   _x___ Stop supplements: Menthol, Topical Analgesic, (BIOFREEZE EX),Liniments (SALONPAS EX) for 7 days prior to surgery.    ____ Bring C-Pap to the hospital.

## 2016-09-27 NOTE — Pre-Procedure Instructions (Addendum)
Spoke with Linda Rhodes from Dr. Sedalia Muta office regarding pt having a tooth pulled on 09/24/16 and was informed she had an abscess with that tooth.  Pt is on antibiotics will finish today.  She is seeing her dentist for a follow up on Friday 10/01/16.    Linda Rhodes texted Dr. Aris Lot the above information, he texted back can't do surgery.  He will call Linda Rhodes back soon.  Spoke with pt regarding waiting for word from Dr. Aris Lot as to whether surgery will be rescheduled or proceed on 10/04/16, that way we will know if labs will need to be done today or at a later date.   Pt opted to leave to pick up her dad at rehab and wait for a call from Dr. Sedalia Muta office regarding surgery.  She will be able to come back later in for labs, EKG and CXR.

## 2016-09-29 ENCOUNTER — Encounter
Admission: RE | Admit: 2016-09-29 | Discharge: 2016-09-29 | Disposition: A | Discharge status: Home / Self Care | Payer: BLUE CROSS/BLUE SHIELD | Source: Ambulatory Visit | Attending: Neurological Surgery | Admitting: Neurological Surgery

## 2016-09-29 ENCOUNTER — Other Ambulatory Visit: Payer: BLUE CROSS/BLUE SHIELD

## 2016-09-29 ENCOUNTER — Ambulatory Visit
Admission: RE | Admit: 2016-09-29 | Discharge: 2016-09-29 | Disposition: A | Discharge status: Home / Self Care | Payer: BLUE CROSS/BLUE SHIELD | Source: Ambulatory Visit | Attending: Neurological Surgery | Admitting: Neurological Surgery

## 2016-09-29 DIAGNOSIS — Z01818 Encounter for other preprocedural examination: Secondary | ICD-10-CM

## 2016-09-29 DIAGNOSIS — I10 Essential (primary) hypertension: Secondary | ICD-10-CM | POA: Insufficient documentation

## 2016-09-29 DIAGNOSIS — K047 Periapical abscess without sinus: Secondary | ICD-10-CM

## 2016-09-29 DIAGNOSIS — Z01811 Encounter for preprocedural respiratory examination: Secondary | ICD-10-CM | POA: Insufficient documentation

## 2016-09-29 DIAGNOSIS — D649 Anemia, unspecified: Secondary | ICD-10-CM

## 2016-09-29 LAB — CBC
HCT: 36.9 % (ref 35.0–47.0)
Hemoglobin: 12.8 g/dL (ref 12.0–16.0)
MCH: 31.6 pg (ref 26.0–34.0)
MCHC: 34.6 g/dL (ref 32.0–36.0)
MCV: 91.4 fL (ref 80.0–100.0)
Platelets: 178 K/uL (ref 150–440)
RBC: 4.04 MIL/uL (ref 3.80–5.20)
RDW: 13.3 % (ref 11.5–14.5)
WBC: 4.1 K/uL (ref 3.6–11.0)

## 2016-09-29 LAB — BASIC METABOLIC PANEL
Anion gap: 8 (ref 5–15)
BUN: 10 mg/dL (ref 6–20)
CHLORIDE: 105 mmol/L (ref 101–111)
CO2: 24 mmol/L (ref 22–32)
CREATININE: 0.81 mg/dL (ref 0.44–1.00)
Calcium: 8.7 mg/dL — ABNORMAL LOW (ref 8.9–10.3)
GFR calc non Af Amer: >60 mL/min (ref >60)
Glucose, Bld: 136 mg/dL — ABNORMAL HIGH (ref 65–99)
POTASSIUM: 2.7 mmol/L — AB (ref 3.5–5.1)
SODIUM: 137 mmol/L (ref 135–145)

## 2016-09-29 LAB — APTT: aPTT: 34 s (ref 24–36)

## 2016-09-29 LAB — DIFFERENTIAL
Basophils Absolute: 0 K/uL (ref 0–0.1)
Basophils Relative: 1 %
Eosinophils Absolute: 0.1 K/uL (ref 0–0.7)
Eosinophils Relative: 3 %
Lymphocytes Relative: 32 %
Lymphs Abs: 1.3 K/uL (ref 1.0–3.6)
Monocytes Absolute: 0.3 K/uL (ref 0.2–0.9)
Monocytes Relative: 7 %
Neutro Abs: 2.4 K/uL (ref 1.4–6.5)
Neutrophils Relative %: 57 %

## 2016-09-29 LAB — TYPE AND SCREEN
ABO/RH(D): A POS
Antibody Screen: NEGATIVE

## 2016-09-29 LAB — PROTIME-INR
INR: 0.99
PROTHROMBIN TIME: 13.1 s (ref 11.4–15.2)

## 2016-09-29 LAB — SEDIMENTATION RATE: Sed Rate: 8 mm/h (ref 0–20)

## 2016-09-29 LAB — SURGICAL PCR SCREEN
MRSA, PCR: NEGATIVE
STAPHYLOCOCCUS AUREUS: NEGATIVE

## 2016-09-29 LAB — C-REACTIVE PROTEIN

## 2016-09-29 NOTE — Pre-Procedure Instructions (Signed)
Kendelyn called back from Dr. Sedalia Muta office regarding the request for potassium supplement requested by Dr. Randa Lynn faxed to his office earlier today.  Ophelia Shoulder will ask Dr. Aris Lot is he wants to start the pt on the potassium supplement or refer this to the pt PCP.  Also the potassium recheck on Friday to be done at PCP office or at PAT. She agreed and will let this RN know.

## 2016-09-29 NOTE — Pre-Procedure Instructions (Signed)
Pt returned to PAT for lab work & EKG, will be sent to radiology for CXR after.  Pt reminded that as long as today's lab work is WNL, no indication of infection is present and pt's dentist writes a letter stating her tooth abscess/infection is cleared and is faxed or taken to Dr. Sedalia Muta during office Friday's appt, then Dr. Aris Lot is OK to proceed with surgery on 10/04/16.

## 2016-09-29 NOTE — Pre-Procedure Instructions (Signed)
Received call from lab reporting a critical potassium valve of 2.7.  Faxed Met B results with a request for a potassium supplement be started ASAP and have potassium level rechecked on Friday either at PCP's office or PAT as well as rechecking potassium level the day of surgery.  Message left on Linda Rhodes's voicemail at Dr. Sedalia Muta office regarding above.

## 2016-09-30 ENCOUNTER — Other Ambulatory Visit: Payer: Self-pay

## 2016-09-30 MED ORDER — POTASSIUM CHLORIDE CRYS ER 20 MEQ PO TBCR: 40.0000 meq | EXTENDED_RELEASE_TABLET | Freq: Two times a day (BID) | ORAL | 1 refills | Status: DC

## 2016-09-30 NOTE — Pre-Procedure Instructions (Signed)
CALL FROM KENDALYN, KT STARTED TODAY BY DR Ronnald Ramp AND NO NEED TO RECHECK UNTIL DAY OF SURGERY BY DR Ronnald Ramp

## 2016-10-01 NOTE — Pre-Procedure Instructions (Signed)
CLEARED BY DENTIST TO PROCEED AFTER VISIT 10/01/16 ON CHART

## 2016-10-04 ENCOUNTER — Ambulatory Visit: Payer: BLUE CROSS/BLUE SHIELD | Admitting: Anesthesiology

## 2016-10-04 ENCOUNTER — Ambulatory Visit: Payer: BLUE CROSS/BLUE SHIELD

## 2016-10-04 ENCOUNTER — Ambulatory Visit
Admission: RE | Admit: 2016-10-04 | Discharge: 2016-10-04 | Disposition: A | Discharge status: Home / Self Care | Payer: BLUE CROSS/BLUE SHIELD | Source: Ambulatory Visit | Attending: Neurological Surgery | Admitting: Neurological Surgery

## 2016-10-04 ENCOUNTER — Encounter: Payer: Self-pay | Admitting: *Deleted

## 2016-10-04 ENCOUNTER — Encounter
Admission: RE | Disposition: A | Discharge status: Home / Self Care | Payer: Self-pay | Source: Ambulatory Visit | Attending: Neurological Surgery

## 2016-10-04 DIAGNOSIS — I10 Essential (primary) hypertension: Secondary | ICD-10-CM | POA: Diagnosis not present

## 2016-10-04 DIAGNOSIS — Z8619 Personal history of other infectious and parasitic diseases: Secondary | ICD-10-CM | POA: Diagnosis not present

## 2016-10-04 DIAGNOSIS — K219 Gastro-esophageal reflux disease without esophagitis: Secondary | ICD-10-CM | POA: Insufficient documentation

## 2016-10-04 DIAGNOSIS — Z90710 Acquired absence of both cervix and uterus: Secondary | ICD-10-CM | POA: Diagnosis not present

## 2016-10-04 DIAGNOSIS — Z419 Encounter for procedure for purposes other than remedying health state, unspecified: Secondary | ICD-10-CM

## 2016-10-04 DIAGNOSIS — D649 Anemia, unspecified: Secondary | ICD-10-CM | POA: Insufficient documentation

## 2016-10-04 DIAGNOSIS — M48061 Spinal stenosis, lumbar region without neurogenic claudication: Secondary | ICD-10-CM | POA: Diagnosis not present

## 2016-10-04 DIAGNOSIS — Z886 Allergy status to analgesic agent status: Secondary | ICD-10-CM | POA: Diagnosis not present

## 2016-10-04 DIAGNOSIS — M5126 Other intervertebral disc displacement, lumbar region: Secondary | ICD-10-CM

## 2016-10-04 DIAGNOSIS — M5418 Radiculopathy, sacral and sacrococcygeal region: Secondary | ICD-10-CM | POA: Diagnosis not present

## 2016-10-04 DIAGNOSIS — Z87891 Personal history of nicotine dependence: Secondary | ICD-10-CM | POA: Insufficient documentation

## 2016-10-04 HISTORY — PX: LUMBAR LAMINECTOMY/DECOMPRESSION MICRODISCECTOMY: SHX5026

## 2016-10-04 LAB — POCT I-STAT 4, (NA,K, GLUC, HGB,HCT)
Glucose, Bld: 99 mg/dL (ref 65–99)
HEMATOCRIT: 36 % (ref 36.0–46.0)
Hemoglobin: 12.2 g/dL (ref 12.0–15.0)
Potassium: 3.6 mmol/L (ref 3.5–5.1)
SODIUM: 142 mmol/L (ref 135–145)

## 2016-10-04 SURGERY — LUMBAR LAMINECTOMY/DECOMPRESSION MICRODISCECTOMY 1 LEVEL
Anesthesia: General

## 2016-10-04 MED ORDER — GELATIN ABSORBABLE 12-7 MM EX MISC
CUTANEOUS | Status: AC
  Filled 2016-10-04: qty 1

## 2016-10-04 MED ORDER — ONDANSETRON HCL 4 MG/2ML IJ SOLN
4.0000 mg | Freq: Once | INTRAMUSCULAR | Status: AC | PRN
  Administered 2016-10-04: 4 mg via INTRAVENOUS

## 2016-10-04 MED ORDER — BACITRACIN 50000 UNITS IM SOLR
INTRAMUSCULAR | Status: DC | PRN
  Administered 2016-10-04: 5000 [IU]

## 2016-10-04 MED ORDER — CYCLOBENZAPRINE HCL 5 MG PO TABS: 5.0000 mg | ORAL_TABLET | Freq: Three times a day (TID) | ORAL | 0 refills | Status: DC | PRN

## 2016-10-04 MED ORDER — MIDAZOLAM HCL 2 MG/2ML IJ SOLN
INTRAMUSCULAR | Status: DC | PRN
  Administered 2016-10-04: 2 mg via INTRAVENOUS

## 2016-10-04 MED ORDER — PROPOFOL 10 MG/ML IV BOLUS
INTRAVENOUS | Status: DC | PRN
  Administered 2016-10-04: 170 mg via INTRAVENOUS

## 2016-10-04 MED ORDER — HYDROMORPHONE HCL 1 MG/ML IJ SOLN
INTRAMUSCULAR | Status: AC
  Administered 2016-10-04: 0.5 mg via INTRAVENOUS
  Filled 2016-10-04: qty 1

## 2016-10-04 MED ORDER — SUCCINYLCHOLINE CHLORIDE 20 MG/ML IJ SOLN
INTRAMUSCULAR | Status: DC | PRN
  Administered 2016-10-04: 100 mg via INTRAVENOUS

## 2016-10-04 MED ORDER — KETAMINE HCL 50 MG/ML IJ SOLN
INTRAMUSCULAR | Status: DC | PRN
  Administered 2016-10-04: 25 mg via INTRAVENOUS

## 2016-10-04 MED ORDER — DEXAMETHASONE SODIUM PHOSPHATE 10 MG/ML IJ SOLN
INTRAMUSCULAR | Status: DC | PRN
  Administered 2016-10-04: 10 mg via INTRAVENOUS

## 2016-10-04 MED ORDER — OMEPRAZOLE 20 MG PO CPDR: 20.0000 mg | DELAYED_RELEASE_CAPSULE | Freq: Every day | ORAL | 0 refills | Status: DC

## 2016-10-04 MED ORDER — SODIUM CHLORIDE 0.9 % IJ SOLN
INTRAMUSCULAR | Status: AC
  Filled 2016-10-04: qty 50

## 2016-10-04 MED ORDER — OXYCODONE-ACETAMINOPHEN 7.5-325 MG PO TABS
1.0000 | ORAL_TABLET | ORAL | Status: DC | PRN
  Administered 2016-10-04: 1 via ORAL
  Filled 2016-10-04: qty 1

## 2016-10-04 MED ORDER — LIDOCAINE HCL (CARDIAC) 20 MG/ML IV SOLN
INTRAVENOUS | Status: DC | PRN
  Administered 2016-10-04: 50 mg via INTRAVENOUS

## 2016-10-04 MED ORDER — CEFAZOLIN SODIUM-DEXTROSE 2-4 GM/100ML-% IV SOLN
INTRAVENOUS | Status: AC
  Filled 2016-10-04: qty 100

## 2016-10-04 MED ORDER — ONDANSETRON HCL 4 MG/2ML IJ SOLN
INTRAMUSCULAR | Status: DC | PRN
  Administered 2016-10-04: 4 mg via INTRAVENOUS

## 2016-10-04 MED ORDER — FENTANYL CITRATE (PF) 100 MCG/2ML IJ SOLN
INTRAMUSCULAR | Status: AC
  Administered 2016-10-04: 25 ug via INTRAVENOUS
  Filled 2016-10-04: qty 2

## 2016-10-04 MED ORDER — DIPHENHYDRAMINE HCL 50 MG/ML IJ SOLN
25.0000 mg | Freq: Once | INTRAMUSCULAR | Status: AC
  Administered 2016-10-04: 25 mg via INTRAVENOUS

## 2016-10-04 MED ORDER — METHYLPREDNISOLONE ACETATE 40 MG/ML IJ SUSP
INTRAMUSCULAR | Status: AC
  Filled 2016-10-04: qty 1

## 2016-10-04 MED ORDER — LACTATED RINGERS IV SOLN
INTRAVENOUS | Status: DC
  Administered 2016-10-04: 07:00:00 via INTRAVENOUS

## 2016-10-04 MED ORDER — SUGAMMADEX SODIUM 200 MG/2ML IV SOLN
INTRAVENOUS | Status: DC | PRN
  Administered 2016-10-04: 140 mg via INTRAVENOUS

## 2016-10-04 MED ORDER — ROCURONIUM BROMIDE 100 MG/10ML IV SOLN
INTRAVENOUS | Status: DC | PRN
  Administered 2016-10-04: 10 mg via INTRAVENOUS
  Administered 2016-10-04: 30 mg via INTRAVENOUS

## 2016-10-04 MED ORDER — BUPIVACAINE HCL (PF) 0.25 % IJ SOLN
INTRAMUSCULAR | Status: AC
  Filled 2016-10-04: qty 30

## 2016-10-04 MED ORDER — METHYLPREDNISOLONE ACETATE 40 MG/ML IJ SUSP
INTRAMUSCULAR | Status: DC | PRN
  Administered 2016-10-04: 40 mg

## 2016-10-04 MED ORDER — THROMBIN 5000 UNITS EX SOLR
CUTANEOUS | Status: AC
  Filled 2016-10-04: qty 5000

## 2016-10-04 MED ORDER — DIPHENHYDRAMINE HCL 50 MG/ML IJ SOLN
INTRAMUSCULAR | Status: AC
  Administered 2016-10-04: 25 mg via INTRAVENOUS
  Filled 2016-10-04: qty 1

## 2016-10-04 MED ORDER — FENTANYL CITRATE (PF) 100 MCG/2ML IJ SOLN
25.0000 ug | INTRAMUSCULAR | Status: DC | PRN
  Administered 2016-10-04 (×4): 25 ug via INTRAVENOUS

## 2016-10-04 MED ORDER — ONDANSETRON HCL 4 MG/2ML IJ SOLN
INTRAMUSCULAR | Status: AC
  Filled 2016-10-04: qty 2

## 2016-10-04 MED ORDER — FAMOTIDINE 20 MG PO TABS
20.0000 mg | ORAL_TABLET | Freq: Once | ORAL | Status: AC
  Administered 2016-10-04: 20 mg via ORAL

## 2016-10-04 MED ORDER — OXYCODONE-ACETAMINOPHEN 7.5-325 MG PO TABS
ORAL_TABLET | ORAL | Status: AC
  Administered 2016-10-04: 1 via ORAL
  Filled 2016-10-04: qty 1

## 2016-10-04 MED ORDER — FAMOTIDINE 20 MG PO TABS
ORAL_TABLET | ORAL | Status: AC
  Filled 2016-10-04: qty 1

## 2016-10-04 MED ORDER — GLYCOPYRROLATE 0.2 MG/ML IJ SOLN
INTRAMUSCULAR | Status: DC | PRN
  Administered 2016-10-04: 0.2 mg via INTRAVENOUS

## 2016-10-04 MED ORDER — BACITRACIN 50000 UNITS IM SOLR
INTRAMUSCULAR | Status: AC
  Filled 2016-10-04: qty 1

## 2016-10-04 MED ORDER — SODIUM CHLORIDE 0.9 % IJ SOLN
INTRAMUSCULAR | Status: AC
  Filled 2016-10-04: qty 10

## 2016-10-04 MED ORDER — CYCLOBENZAPRINE HCL 5 MG PO TABS
5.0000 mg | ORAL_TABLET | Freq: Once | ORAL | Status: AC
  Administered 2016-10-04: 5 mg via ORAL
  Filled 2016-10-04: qty 1

## 2016-10-04 MED ORDER — THROMBIN 5000 UNITS EX SOLR
CUTANEOUS | Status: DC | PRN
  Administered 2016-10-04: 5000 [IU] via TOPICAL

## 2016-10-04 MED ORDER — BUPIVACAINE-EPINEPHRINE (PF) 0.25% -1:200000 IJ SOLN
INTRAMUSCULAR | Status: DC | PRN
  Administered 2016-10-04: 7 mL

## 2016-10-04 MED ORDER — OXYCODONE-ACETAMINOPHEN 7.5-325 MG PO TABS: 1.0000 | ORAL_TABLET | ORAL | 0 refills | Status: DC | PRN

## 2016-10-04 MED ORDER — FENTANYL CITRATE (PF) 100 MCG/2ML IJ SOLN
INTRAMUSCULAR | Status: DC | PRN
Start: 2016-10-04 — End: 2016-10-04
  Administered 2016-10-04: 100 ug via INTRAVENOUS
  Administered 2016-10-04 (×3): 50 ug via INTRAVENOUS

## 2016-10-04 MED ORDER — EPINEPHRINE PF 1 MG/ML IJ SOLN
INTRAMUSCULAR | Status: AC
  Filled 2016-10-04: qty 1

## 2016-10-04 MED ORDER — HYDROMORPHONE HCL 1 MG/ML IJ SOLN
0.5000 mg | INTRAMUSCULAR | Status: DC | PRN
  Administered 2016-10-04: 0.5 mg via INTRAVENOUS

## 2016-10-04 MED ORDER — CEFAZOLIN SODIUM-DEXTROSE 2-4 GM/100ML-% IV SOLN
2.0000 g | Freq: Once | INTRAVENOUS | Status: AC
  Administered 2016-10-04: 2 g via INTRAVENOUS

## 2016-10-04 SURGICAL SUPPLY — 49 items
BAND RUBBER 3X1/6 TAN STRL (MISCELLANEOUS) ×4 IMPLANT
BUR NEURO DRILL SOFT 3.0X3.8M (BURR) ×2 IMPLANT
CANISTER SUCT 1200ML W/VALVE (MISCELLANEOUS) ×4 IMPLANT
CHLORAPREP W/TINT 26ML (MISCELLANEOUS) ×4 IMPLANT
CNTNR SPEC 2.5X3XGRAD LEK (MISCELLANEOUS) ×1
CONT SPEC 4OZ STER OR WHT (MISCELLANEOUS) ×1
CONTAINER SPEC 2.5X3XGRAD LEK (MISCELLANEOUS) ×1 IMPLANT
COUNTER NEEDLE 20/40 LG (NEEDLE) ×2 IMPLANT
COVER LIGHT HANDLE STERIS (MISCELLANEOUS) ×4 IMPLANT
DERMABOND ADVANCED (GAUZE/BANDAGES/DRESSINGS) ×1
DERMABOND ADVANCED .7 DNX12 (GAUZE/BANDAGES/DRESSINGS) ×1 IMPLANT
DRAPE C-ARM XRAY 36X54 (DRAPES) ×2 IMPLANT
DRAPE C-ARMOR (DRAPES) ×2 IMPLANT
DRAPE LAPAROTOMY 100X77 ABD (DRAPES) ×2 IMPLANT
DRAPE MICROSCOPE LEICA (MISCELLANEOUS) ×2 IMPLANT
DRAPE POUCH INSTRU U-SHP 10X18 (DRAPES) ×2 IMPLANT
DRAPE SURG 17X11 SM STRL (DRAPES) ×8 IMPLANT
ELECT CAUTERY BLADE TIP 2.5 (TIP) ×2
ELECT EZSTD 165MM 6.5IN (MISCELLANEOUS) ×2
ELECTRODE CAUTERY BLDE TIP 2.5 (TIP) ×1 IMPLANT
ELECTRODE EZSTD 165MM 6.5IN (MISCELLANEOUS) ×1 IMPLANT
GLOVE BIO SURGEON STRL SZ7.5 (GLOVE) ×4 IMPLANT
GLOVE BIOGEL PI IND STRL 8 (GLOVE) ×1 IMPLANT
GLOVE BIOGEL PI INDICATOR 8 (GLOVE) ×1
GOWN STRL REUS W/ TWL LRG LVL3 (GOWN DISPOSABLE) ×1 IMPLANT
GOWN STRL REUS W/ TWL XL LVL3 (GOWN DISPOSABLE) ×1 IMPLANT
GOWN STRL REUS W/TWL LRG LVL3 (GOWN DISPOSABLE) ×1
GOWN STRL REUS W/TWL XL LVL3 (GOWN DISPOSABLE) ×1
GRADUATE 1200CC STRL 31836 (MISCELLANEOUS) ×2 IMPLANT
KIT SPINAL PRONEVIEW (KITS) ×2 IMPLANT
KNIFE BAYONET SHORT DISCETOMY (MISCELLANEOUS) ×2 IMPLANT
MARKER SKIN DUAL TIP RULER LAB (MISCELLANEOUS) ×4 IMPLANT
NDL SAFETY ECLIPSE 18X1.5 (NEEDLE) ×3 IMPLANT
NEEDLE HYPO 18GX1.5 SHARP (NEEDLE) ×3
NS IRRIG 1000ML POUR BTL (IV SOLUTION) ×2 IMPLANT
PACK LAMINECTOMY NEURO (CUSTOM PROCEDURE TRAY) ×2 IMPLANT
SPOGE SURGIFLO 8M (HEMOSTASIS) ×1
SPONGE SURGIFLO 8M (HEMOSTASIS) ×1 IMPLANT
SUT MNCRL AB 3-0 PS2 27 (SUTURE) IMPLANT
SUT NURALON 4 0 TR CR/8 (SUTURE) IMPLANT
SUT VIC AB 2-0 CT1 18 (SUTURE) ×2 IMPLANT
SUT VICRYL 0 UR6 27IN ABS (SUTURE) ×2 IMPLANT
SYR 3ML LL SCALE MARK (SYRINGE) ×2 IMPLANT
SYRINGE 10CC LL (SYRINGE) ×2 IMPLANT
TOWEL OR 17X26 4PK STRL BLUE (TOWEL DISPOSABLE) ×6 IMPLANT
TUBE MATRX SPINL 18MM 6CM DISP (INSTRUMENTS) ×1
TUBE METRX SPINAL 18X6 DISP (INSTRUMENTS) ×1 IMPLANT
TUBING CONNECTING 10 (TUBING) ×2 IMPLANT
WATER STERILE IRR 1000ML POUR (IV SOLUTION) ×2 IMPLANT

## 2016-10-04 NOTE — Transfer of Care (Signed)
Immediate Anesthesia Transfer of Care Note  Patient: Linda Rhodes  Procedure(s) Performed: Procedure(s): right minimally invasive L5 S1 microdiscectomy (N/A)  Patient Location: PACU  Anesthesia Type:General  Level of Consciousness: awake  Airway & Oxygen Therapy: Patient Spontanous Breathing and Patient connected to face mask oxygen  Post-op Assessment: Report given to RN, Post -op Vital signs reviewed and stable and Patient moving all extremities X 4  Post vital signs: Reviewed  Last Vitals:  Vitals:   10/04/16 0609 10/04/16 0930  BP: (!) 141/99 (!) 121/97  Pulse: 94 89  Resp: 12 16  Temp: 36.6 C 36.2 C    Last Pain:  Vitals:   10/04/16 0609  TempSrc: Tympanic  PainSc: 3          Complications: No apparent anesthesia complications

## 2016-10-04 NOTE — Discharge Instructions (Signed)
AMBULATORY SURGERY  °DISCHARGE INSTRUCTIONS ° ° °1) The drugs that you were given will stay in your system until tomorrow so for the next 24 hours you should not: ° °A) Drive an automobile °B) Make any legal decisions °C) Drink any alcoholic beverage ° ° °2) You may resume regular meals tomorrow.  Today it is better to start with liquids and gradually work up to solid foods. ° °You may eat anything you prefer, but it is better to start with liquids, then soup and crackers, and gradually work up to solid foods. ° ° °3) Please notify your doctor immediately if you have any unusual bleeding, trouble breathing, redness and pain at the surgery site, drainage, fever, or pain not relieved by medication. ° ° ° °4) Additional Instructions: ° ° ° ° ° ° ° °Please contact your physician with any problems or Same Day Surgery at [phone], Monday through Friday 6 am to 4 pm, or [facility] number at [phone]. °

## 2016-10-04 NOTE — OR Nursing (Signed)
abx to be started in [place], RN aware Ancef 2 gm going back with patient

## 2016-10-04 NOTE — Anesthesia Preprocedure Evaluation (Signed)
Anesthesia Evaluation  Patient identified by MRN, date of birth, ID band Patient awake    Reviewed: Allergy & Precautions, H&P , NPO status , Patient's Chart, lab work & pertinent test results, reviewed documented beta blocker date and time   History of Anesthesia Complications (+) PONV and DIFFICULT AIRWAY  Airway Mallampati: II  TM Distance: >3 FB Neck ROM: full    Dental no notable dental hx.    Pulmonary former smoker,    Pulmonary exam normal breath sounds clear to auscultation       Cardiovascular Exercise Tolerance: Good hypertension, On Medications  Rhythm:regular Rate:Normal     Neuro/Psych  Headaches, negative psych ROS   GI/Hepatic Neg liver ROS, GERD  Medicated,  Endo/Other  negative endocrine ROS  Renal/GU negative Renal ROS  negative genitourinary   Musculoskeletal   Abdominal   Peds negative pediatric ROS (+)  Hematology  (+) anemia ,   Anesthesia Other Findings overbite  Reproductive/Obstetrics negative OB ROS                             Anesthesia Physical  Anesthesia Plan  ASA: II  Anesthesia Plan: General   Post-op Pain Management:    Induction: Intravenous and Rapid sequence  Airway Management Planned: Oral ETT  Additional Equipment:   Intra-op Plan:   Post-operative Plan: Extubation in OR  Informed Consent: I have reviewed the patients History and Physical, chart, labs and discussed the procedure including the risks, benefits and alternatives for the proposed anesthesia with the patient or authorized representative who has indicated his/her understanding and acceptance.   Dental advisory given  Plan Discussed with: CRNA and Surgeon  Anesthesia Plan Comments:         Anesthesia Quick Evaluation

## 2016-10-04 NOTE — Anesthesia Procedure Notes (Signed)
Procedure Name: Intubation Performed by: Undra Harriman Pre-anesthesia Checklist: Patient identified, Patient being monitored, Timeout performed, Emergency Drugs available and Suction available Patient Re-evaluated:Patient Re-evaluated prior to inductionOxygen Delivery Method: Circle system utilized Preoxygenation: Pre-oxygenation with 100% oxygen Intubation Type: IV induction Ventilation: Mask ventilation without difficulty Laryngoscope Size: Miller and 2 Grade View: Grade I Tube type: Oral Tube size: 7.0 mm Number of attempts: 1 Airway Equipment and Method: Stylet Placement Confirmation: ETT inserted through vocal cords under direct vision,  positive ETCO2 and breath sounds checked- equal and bilateral Secured at: 21 cm Tube secured with: Tape Dental Injury: Teeth and Oropharynx as per pre-operative assessment        

## 2016-10-04 NOTE — Anesthesia Postprocedure Evaluation (Signed)
Anesthesia Post Note  Patient: Linda Rhodes  Procedure(s) Performed: Procedure(s) (LRB): right minimally invasive L5 S1 microdiscectomy (N/A)  Patient location during evaluation: PACU Anesthesia Type: General Level of consciousness: awake and alert and oriented Pain management: pain level controlled Vital Signs Assessment: post-procedure vital signs reviewed and stable Respiratory status: spontaneous breathing Cardiovascular status: blood pressure returned to baseline Anesthetic complications: no Comments: Some swelling around eyes and lips improving during PACU stay.  No Pulmonary difficulties     Last Vitals:  Vitals:   10/04/16 1051 10/04/16 1111  BP: (!) 156/90 (!) 143/98  Pulse: 75   Resp: 16   Temp: 36.7 C     Last Pain:  Vitals:   10/04/16 1051  TempSrc:   PainSc: 6                  Prudencio Velazco

## 2016-10-04 NOTE — Op Note (Signed)
Indications: Right S1 radiculopathy failed conservative measures with lateral recess stenosis at L5/S1  Findings: S1 foraminal narrowing/lateral recess stenosis on right with mild/moderate disc protrusion  Procedure: Right MIS L5/S1 hemilaminotomy, microdiscectomy and S1 nerve root foraminotomy  EBL: minimal  Abx: 2g. Ancef  Attending: Dr. Era Bumpers  Anesthesia: GETA;   Preoperative Diagnosis: S1 radiculopathy Postoperative Diagnosis: Same  Preoperative Note:   Risks of surgery discussed include: infection, bleeding, stroke, coma, death, paralysis, CSF leak, nerve/spinal cord injury, numbness, tingling, weakness, complex regional pain syndrome, recurrent stenosis and/or disc herniation, vascular injury, development of instability, neck/back pain, need for further surgery, persistent symptoms, development of deformity, and the risks of anesthesia. They understood these risks and have agreed to proceed.  Operative Note:   The patient was then brought from the preoperative center with intravenous access established.  They underwent general anesthesia and endotracheal tube intubation.  They were then rotated on the [place] rail top where all pressure points were appropriately padded.  The skin was then thoroughly cleansed.  Perioperative antibiotic prophylaxis was administered.  Sterile prep and drapes were then applied and a timeout was then observed.  C-arm was brought into the field under sterile conditions and under AP visualization with the use of a K wire we established anatomic view the midline spinous process established with a marking pen.  As well as the medial pedicle boundaries on the right.  Once these lines were established we then rotated into the lateral position and the corresponding level was identified counting from the sacrum with the use of a local anesthetic needle.  Once this was complete a 2 cm incision was opened with the use of a #10 blade knife.  The metrics tubes  were sequentially advanced under live lateral fluoroscopy until a #18 tube by 48mm depth was established the final tube was then locked in place to the bed side attachment.  Fluoroscopy was then removed from the field.  The microscope was then sterilely brought into the field and muscle creep was hemostased with a bipolar and resected with a pituitary rongeur.  A Bovie extender was then used to expose the spinous process and lamina.  Careful attention was placed to not violate the facet capsule a 3 mm matchstick drill bit was then used to make a hemi-laminotomy trough until the ligamentum flavum was exposed.  This was extended to the base of the spinous process.  Once this was complete and the underlying ligamentum flavum was visualized this was dissected with a 6 oh up angle curette and resected with a #2 #3 biting Kerrison.  The laminotomy opening was also expanded in similar fashion and hemostasis was obtained with Surgifoam and a patty as well as bone wax.  The rostral aspect of the caudal level of the lamina was also resected with a #2 biting Kerrison effort to further enhance exposure til we were rostral to the disc space and lateral recess. This was then extended laterally to remove components of the medial facet joint to expand the foraminotomy distally til a right angle ball tip feeler could be passed without compression. The nerve root was noted to be under tension at the take off of the thecal sac.  Once the underlying dura was visualized a Penfield 4 was then used to dissect and expose the traversing nerve root.  Once this was identified a nerve root retractor was used to mobilize this medially.  The venous plexus was hemostased with Surgifoam and light bipolar use.  #15  blade knife was then used to make a small annulotomy within the disc space and disc space contents were noted to to come through the annulus.  This was resected with a pituitary rongeur.  This was sent for permanent pathology.  A 6-0  up-angled curette was passed within the disc space to further mobilize disc space fragments as well as a right angle micro ball-tipped feeler.  Further displace contents were removed in similar fashion with a pituitary rongeur.  Once the thecal sac and nerve root were noted to be relaxed and under less tension the ball-tipped feeler was passed along the foramen distally to to ensure no residual compression was noted.  With none noted attention was then turned to closure.  Prior to this a Valsalva maneuver was performed and no evidence of CSF leak was noted.  A Depo-Medrol soaked Gelfoam pledget was placed along the annulotomy.  The tube system was then removed under microscopic visualization and hemostasis was obtained with a bipolar.  The fascial layer was reapproximated with the use of a 0- Vicryl suture.  Subcutaneous tissue layer was reapproximated using 2-0 Vicryl suture.  The skin was then cleansed and Dermabond was used to close the skin opening.  Patient was then rotated back to the preoperative bed awakened from anesthesia and taken to recovery all counts are correct in this case.  I was present for all portions of the procedure.   Era Bumpers, MD
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
## 2016-10-05 ENCOUNTER — Encounter: Payer: Self-pay | Admitting: Neurological Surgery

## 2016-10-05 NOTE — OR Nursing (Signed)
8:45 Om post op phone call she admitted to not liking how the oxycodone made her feel.  Will try to cut it in half and she if she can tolerate it. She will resume her naproxen today and try plain tylenol if necessary.  Instructed to call MD if this is not effective.  Asked if she should limit her sitting to 15 min. Increments.  Instructed that that would be reasonable and she should not sleep on her stomach.  Sleep on her side with pillow between her legs or her back.

## 2017-01-10 ENCOUNTER — Encounter: Payer: Self-pay | Admitting: Family Medicine

## 2017-01-10 ENCOUNTER — Ambulatory Visit (INDEPENDENT_AMBULATORY_CARE_PROVIDER_SITE_OTHER): Payer: BLUE CROSS/BLUE SHIELD | Admitting: Family Medicine

## 2017-01-10 VITALS — BP 120/80 | HR 80 | Ht 68.0 in | Wt 140.0 lb

## 2017-01-10 DIAGNOSIS — B029 Zoster without complications: Secondary | ICD-10-CM

## 2017-01-10 DIAGNOSIS — E876 Hypokalemia: Secondary | ICD-10-CM | POA: Diagnosis not present

## 2017-01-10 DIAGNOSIS — F17210 Nicotine dependence, cigarettes, uncomplicated: Secondary | ICD-10-CM | POA: Diagnosis not present

## 2017-01-10 MED ORDER — TRAMADOL HCL 50 MG PO TABS: 50.0000 mg | ORAL_TABLET | Freq: Three times a day (TID) | ORAL | 0 refills | Status: DC | PRN

## 2017-01-10 MED ORDER — VARENICLINE TARTRATE 0.5 MG X 11 & 1 MG X 42 PO MISC: ORAL | 0 refills | Status: DC

## 2017-01-10 MED ORDER — VALACYCLOVIR HCL 1 G PO TABS: 1000.0000 mg | ORAL_TABLET | Freq: Three times a day (TID) | ORAL | 0 refills | Status: DC

## 2017-01-10 NOTE — Progress Notes (Signed)
Name: Linda Rhodes   MRN: [id number]    DOB: 12-09-1975   Date:01/10/2017       Progress Note  Subjective  Chief Complaint  Chief Complaint  Patient presents with  . Herpes Zoster    had shingles 0n May 5 of last year on chest- feels like a burning behind L) ear on scalp going up back of head.    Rash  This is a new problem. The current episode started in the past 7 days. The problem has been gradually worsening since onset. The affected locations include the scalp and head. The rash is characterized by burning and pain. She was exposed to nothing. Pertinent negatives include no congestion, cough, diarrhea, facial edema, fever, joint pain, rhinorrhea, shortness of breath or sore throat. Past treatments include cold compress (topical alcohol). The treatment provided no relief.    No problem-specific Assessment & Plan notes found for this encounter.   Past Medical History:  Diagnosis Date  . Abscessed tooth 09/24/2016   right lower tooth removed.  . Anemia    in past, prior to removal of uterine fibroids  . Bladder infection   . Difficult intubation   . GERD (gastroesophageal reflux disease)   . Headache    migrains  . Hypertension   . Motion sickness    all moving vehicles  . Ovarian cyst   . PONV (postoperative nausea and vomiting)     Past Surgical History:  Procedure Laterality Date  . ABDOMINAL HYSTERECTOMY    . BACK SURGERY  2016   [facility] Neurosurgery - ruptured L4-5  . CESAREAN SECTION    . CHOLECYSTECTOMY    . CYSTOSCOPY N/A 05/06/2015   Procedure: CYSTOSCOPY;  Surgeon: Aletha Halim, MD;  Location: [facility];  Service: Gynecology;  Laterality: N/A;  . ETHMOIDECTOMY Right 11/25/2015   Procedure:  ANTERIOR ETHMOIDECTOMY;  Surgeon: Carloyn Manner, MD;  Location: [place];  Service: ENT;  Laterality: Right;  . FRONTAL SINUS EXPLORATION Right 11/25/2015   Procedure: FRONTAL SINUSOTOMY;  Surgeon: Carloyn Manner, MD;  Location: [place];   Service: ENT;  Laterality: Right;  . IMAGE GUIDED SINUS SURGERY N/A 11/25/2015   Procedure: IMAGE GUIDED SINUS SURGERY WITH BALLOON;  Surgeon: Carloyn Manner, MD;  Location: [place];  Service: ENT;  Laterality: N/A;  . LAPAROSCOPIC HYSTERECTOMY N/A 05/06/2015   Procedure: HYSTERECTOMY TOTAL LAPAROSCOPIC;  Surgeon: Aletha Halim, MD;  Location: [facility];  Service: Gynecology;  Laterality: N/A;  . LUMBAR LAMINECTOMY/DECOMPRESSION MICRODISCECTOMY N/A 10/04/2016   Procedure: right minimally invasive L5 S1 microdiscectomy;  Surgeon: Bayard Hugger, MD;  Location: [facility];  Service: Neurosurgery;  Laterality: N/A;  . MAXILLARY ANTROSTOMY Right 11/25/2015   Procedure: MAXILLARY ANTROSTOMY WITH TISSUE REMOVAL;  Surgeon: Carloyn Manner, MD;  Location: [place];  Service: ENT;  Laterality: Right;  . WISDOM TOOTH EXTRACTION  10/2115   3 teeth removed.    Family History  Problem Relation Age of Onset  . Diabetes Mother   . Hypertension Mother   . Diabetes Father   . Hypertension Father     Social History   Social History  . Marital status: Single    Spouse name: N/A  . Number of children: N/A  . Years of education: N/A   Occupational History  . Not on file.   Social History Main Topics  . Smoking status: Former Smoker    Packs/day: 0.50    Years: 7.00    Types: Cigarettes    Quit  date: 10/19/2015  . Smokeless tobacco: Never Used  . Alcohol use Yes     Comment: socially - 1x/mo  . Drug use: No  . Sexual activity: Yes   Other Topics Concern  . Not on file   Social History Narrative  . No narrative on file    Allergies  Allergen Reactions  . Aspirin Other (See Comments)    Causes burning feeling in her stomach.   . Adhesive [Tape] Rash    And skin tears from stronger tapes    Outpatient Medications Prior to Visit  Medication Sig Dispense Refill  . acetaminophen (TYLENOL) 500 MG tablet Take 1,000 mg by mouth every 6 (six) hours as needed for mild pain.     . cyclobenzaprine (FLEXERIL) 5 MG tablet Take 1 tablet (5 mg total) by mouth 3 (three) times daily as needed for muscle spasms. 60 tablet 0  . Menthol, Topical Analgesic, (BIOFREEZE EX) Apply 1 application topically 3 (three) times daily as needed (back pain). Alternates between [employer] and [employer]    . metoprolol succinate (TOPROL-XL) 100 MG 24 hr tablet Take 25 mg by mouth daily.  0  . cyclobenzaprine (FLEXERIL) 10 MG tablet Take 10 mg by mouth at bedtime as needed for muscle spasms.     Marland Kitchen NICOTINE STEP 1 21 MG/24HR patch PLACE 1 PATCH (21 MG TOTAL) ONTO THE SKIN DAILY. (Patient not taking: Reported on 01/10/2017) 28 patch 0  . potassium chloride SA (K-DUR,KLOR-CON) 20 MEQ tablet Take 2 tablets (40 mEq total) by mouth 2 (two) times daily. Take 2 tabs BID today and the remainder of the weekend.  MAKE SURE TO TAKE 40 MEQ MORNING OF SURG- then cont 40 meq BID after that- recheck potassium level with Jones in 2 weeks (Patient not taking: Reported on 01/10/2017) 120 tablet 1  . diphenhydrAMINE (BENADRYL) 25 mg capsule Take 25 mg by mouth every 6 (six) hours as needed for allergies.    . fluticasone (FLONASE) 50 MCG/ACT nasal spray Place 2 sprays into both nostrils daily. 16 g 0  . hydrochlorothiazide (HYDRODIURIL) 12.5 MG tablet Take 1 tablet (12.5 mg total) by mouth daily. (Patient not taking: Reported on 09/27/2016) 90 tablet 3  . Liniments (SALONPAS EX) Apply 1 patch topically daily as needed (back pain). Alternates between patches and Bio Freeze    . naproxen (NAPROSYN) 500 MG tablet Take 1 tablet (500 mg total) by mouth 2 (two) times daily with a meal. 60 tablet 0  . omeprazole (PRILOSEC) 20 MG capsule Take 1 capsule (20 mg total) by mouth daily. 30 capsule 0  . oxyCODONE-acetaminophen (PERCOCET) 7.5-325 MG tablet Take 1 tablet by mouth every 4 (four) hours as needed for severe pain. 30 tablet 0  . traMADol (ULTRAM) 50 MG tablet Take 50 mg by mouth daily as needed for moderate pain.     No  facility-administered medications prior to visit.     Review of Systems  Constitutional: Negative for chills, fever, malaise/fatigue and weight loss.  HENT: Negative for congestion, ear discharge, ear pain, rhinorrhea and sore throat.   Eyes: Negative for blurred vision.  Respiratory: Negative for cough, sputum production, shortness of breath and wheezing.   Cardiovascular: Negative for chest pain, palpitations and leg swelling.  Gastrointestinal: Negative for abdominal pain, blood in stool, constipation, diarrhea, heartburn, melena and nausea.  Genitourinary: Negative for dysuria, frequency, hematuria and urgency.  Musculoskeletal: Negative for back pain, joint pain, myalgias and neck pain.  Skin: Positive for rash.  Neurological:  Negative for dizziness, tingling, sensory change, focal weakness and headaches.  Endo/Heme/Allergies: Negative for environmental allergies and polydipsia. Does not bruise/bleed easily.  Psychiatric/Behavioral: Negative for depression and suicidal ideas. The patient is not nervous/anxious and does not have insomnia.      Objective  Vitals:   01/10/17 1019  BP: 120/80  Pulse: 80  Weight: 140 lb (63.5 kg)  Height: 5\' 8"  (1.727 m)    Physical Exam  Constitutional: She is well-developed, well-nourished, and in no distress. No distress.  HENT:  Head: Normocephalic and atraumatic.  Right Ear: External ear normal.  Left Ear: External ear normal.  Nose: Nose normal.  Mouth/Throat: Oropharynx is clear and moist.  Eyes: Conjunctivae and EOM are normal. Pupils are equal, round, and reactive to light. Right eye exhibits no discharge. Left eye exhibits no discharge.  Neck: Normal range of motion. Neck supple. No JVD present. No thyromegaly present.  Cardiovascular: Normal rate, regular rhythm, normal heart sounds and intact distal pulses.  Exam reveals no gallop and no friction rub.   No murmur heard. Pulmonary/Chest: Effort normal and breath sounds normal. She  has no wheezes. She has no rales.  Abdominal: Soft. Bowel sounds are normal. She exhibits no mass. There is no tenderness. There is no guarding.  Musculoskeletal: Normal range of motion. She exhibits no edema.  Lymphadenopathy:    She has no cervical adenopathy.  Neurological: She is alert.  Skin: Skin is warm and dry. Rash noted. Rash is vesicular. She is not diaphoretic.     Psychiatric: Mood and affect normal.  Nursing note and vitals reviewed.     Assessment & Plan  Problem List Items Addressed This Visit    None    Visit Diagnoses    Herpes zoster without complication    -  Primary   Relevant Medications   valACYclovir (VALTREX) 1000 MG tablet   traMADol (ULTRAM) 50 MG tablet   Hypokalemia       Relevant Orders   Renal Function Panel   Cigarette nicotine dependence without complication       Relevant Medications   varenicline (CHANTIX STARTING MONTH PAK) 0.5 MG X 11 & 1 MG X 42 tablet      Meds ordered this encounter  Medications  . valACYclovir (VALTREX) 1000 MG tablet    Sig: Take 1 tablet (1,000 mg total) by mouth 3 (three) times daily.    Dispense:  21 tablet    Refill:  0  . traMADol (ULTRAM) 50 MG tablet    Sig: Take 1 tablet (50 mg total) by mouth every 8 (eight) hours as needed.    Dispense:  30 tablet    Refill:  0  . varenicline (CHANTIX STARTING MONTH PAK) 0.5 MG X 11 & 1 MG X 42 tablet    Sig: Take one 0.5 mg tablet by mouth once daily for 3 days, then increase to one 0.5 mg tablet twice daily for 4 days, then increase to one 1 mg tablet twice daily.    Dispense:  53 tablet    Refill:  0      Dr. Otilio Miu [facility]  01/10/17

## 2017-01-25 ENCOUNTER — Other Ambulatory Visit: Payer: Self-pay

## 2017-02-10 ENCOUNTER — Ambulatory Visit
Admission: EM | Admit: 2017-02-10 | Discharge: 2017-02-10 | Disposition: A | Discharge status: Home / Self Care | Payer: BLUE CROSS/BLUE SHIELD | Attending: Family Medicine | Admitting: Family Medicine

## 2017-02-10 DIAGNOSIS — B029 Zoster without complications: Secondary | ICD-10-CM | POA: Diagnosis not present

## 2017-02-10 MED ORDER — PREDNISONE 10 MG PO TABS: ORAL_TABLET | ORAL | 0 refills | Status: DC

## 2017-02-10 MED ORDER — VALACYCLOVIR HCL 1 G PO TABS: 1000.0000 mg | ORAL_TABLET | Freq: Three times a day (TID) | ORAL | 0 refills | Status: AC

## 2017-02-10 NOTE — ED Triage Notes (Signed)
Pt was dx with shingles back in March and her PCP prescribed her medication, however about 1 week ago the rash has appeared on her left upper chest. It is painful

## 2017-02-10 NOTE — ED Provider Notes (Signed)
[facility] ____________________________________________  Time seen: Approximately 10:29 AM  I have reviewed the triage vital signs and the nursing notes.   HISTORY  Chief Complaint Herpes Zoster   HPI Linda Rhodes is a 41 y.o. female presenting for evaluation of left chest rash that has been present for approximately 1 week. Patient states rash has increased in last few days. Patient reports approximately one month ago she was seen by her primary care physician for evaluation of rash to the left posterior head that was diagnosed with shingles and was treated with oral Valtrex. Patient states that that rash improved, then resolved and then noticed left chest rash. Reports did have chickenpox as a child. Reports has had shingles prior to these past 2 concerns, and states that was many years ago. Reports she is currently under a lot of emotional stress and having to take care of a sick father. Denies any recent physical sickness other than the above. Denies fevers, chest pain, shortness of breath, trauma, weakness or other complaints. Patient does also report she has chronic back pain in which she is currently undergoing physical therapy and also has pains about. Patient reports that she does still have a few tramadol tablets at home but was given to her by her neurosurgeon. Denies recent antibiotic use. Patient states that rash is very tender and painful. States rash is occasionally itchy. Denies any drainage, pain radiation, paresthesias or other rash noted. Denies any changes in foods, medicines, lotions, detergents or other triggers. Denies others in household with similar.  No LMP recorded (lmp unknown). Patient has had a hysterectomy.  Otilio Miu, MD: PCP   Past Medical History:  Diagnosis Date  . Abscessed tooth 09/24/2016   right lower tooth removed.  . Anemia    in past, prior to removal of uterine fibroids  . Bladder infection   . Difficult intubation   . GERD  (gastroesophageal reflux disease)   . Headache    migrains  . Hypertension   . Motion sickness    all moving vehicles  . Ovarian cyst   . PONV (postoperative nausea and vomiting)     Patient Active Problem List   Diagnosis Date Noted  . S/P hysterectomy 05/06/2015    Past Surgical History:  Procedure Laterality Date  . ABDOMINAL HYSTERECTOMY    . BACK SURGERY  2016   [facility] Neurosurgery - ruptured L4-5  . CESAREAN SECTION    . CHOLECYSTECTOMY    . CYSTOSCOPY N/A 05/06/2015   Procedure: CYSTOSCOPY;  Surgeon: Aletha Halim, MD;  Location: [facility];  Service: Gynecology;  Laterality: N/A;  . ETHMOIDECTOMY Right 11/25/2015   Procedure:  ANTERIOR ETHMOIDECTOMY;  Surgeon: Carloyn Manner, MD;  Location: [place];  Service: ENT;  Laterality: Right;  . FRONTAL SINUS EXPLORATION Right 11/25/2015   Procedure: FRONTAL SINUSOTOMY;  Surgeon: Carloyn Manner, MD;  Location: [place];  Service: ENT;  Laterality: Right;  . IMAGE GUIDED SINUS SURGERY N/A 11/25/2015   Procedure: IMAGE GUIDED SINUS SURGERY WITH BALLOON;  Surgeon: Carloyn Manner, MD;  Location: [place];  Service: ENT;  Laterality: N/A;  . LAPAROSCOPIC HYSTERECTOMY N/A 05/06/2015   Procedure: HYSTERECTOMY TOTAL LAPAROSCOPIC;  Surgeon: Aletha Halim, MD;  Location: [facility];  Service: Gynecology;  Laterality: N/A;  . LUMBAR LAMINECTOMY/DECOMPRESSION MICRODISCECTOMY N/A 10/04/2016   Procedure: right minimally invasive L5 S1 microdiscectomy;  Surgeon: Bayard Hugger, MD;  Location: [facility];  Service: Neurosurgery;  Laterality: N/A;  . MAXILLARY ANTROSTOMY Right 11/25/2015  Procedure: MAXILLARY ANTROSTOMY WITH TISSUE REMOVAL;  Surgeon: Carloyn Manner, MD;  Location: [place];  Service: ENT;  Laterality: Right;  . WISDOM TOOTH EXTRACTION  10/2115   3 teeth removed.     No current facility-administered medications for this encounter.   Current Outpatient Prescriptions:  .  acetaminophen  (TYLENOL) 500 MG tablet, Take 1,000 mg by mouth every 6 (six) hours as needed for mild pain., Disp: , Rfl:  .  cyclobenzaprine (FLEXERIL) 5 MG tablet, Take 1 tablet (5 mg total) by mouth 3 (three) times daily as needed for muscle spasms., Disp: 60 tablet, Rfl: 0 .  metoprolol succinate (TOPROL-XL) 100 MG 24 hr tablet, Take 25 mg by mouth daily., Disp: , Rfl: 0 .  Menthol, Topical Analgesic, (BIOFREEZE EX), Apply 1 application topically 3 (three) times daily as needed (back pain). Alternates between [employer] and [employer], Disp: , Rfl:  .  NICOTINE STEP 1 21 MG/24HR patch, PLACE 1 PATCH (21 MG TOTAL) ONTO THE SKIN DAILY. (Patient not taking: Reported on 01/10/2017), Disp: 28 patch, Rfl: 0 .  predniSONE (DELTASONE) 10 MG tablet, Start 60 mg po day one, then 50 mg po day two, taper by 10 mg daily until complete., Disp: 21 tablet, Rfl: 0 .  valACYclovir (VALTREX) 1000 MG tablet, Take 1 tablet (1,000 mg total) by mouth 3 (three) times daily., Disp: 21 tablet, Rfl: 0  Allergies Aspirin and Adhesive [tape]  Family History  Problem Relation Age of Onset  . Diabetes Mother   . Hypertension Mother   . Diabetes Father   . Hypertension Father     Social History Social History  Substance Use Topics  . Smoking status: Former Smoker    Packs/day: 0.50    Years: 7.00    Types: Cigarettes    Quit date: 10/19/2015  . Smokeless tobacco: Never Used  . Alcohol use Yes     Comment: socially - 1x/mo    Review of Systems Constitutional: No fever/chills Eyes: No visual changes. Denies any eye discomfort. ENT: No sore throat. Denies mouth or throat discomfort. Cardiovascular: Denies chest pain. Respiratory: Denies shortness of breath. Gastrointestinal: No abdominal pain.  No nausea, no vomiting.   Genitourinary: Negative for dysuria. Musculoskeletal: Reports chronic back pain unchanged. Skin: Positive for rash.   ____________________________________________   PHYSICAL EXAM:  VITAL SIGNS: ED  Triage Vitals [02/10/17 0957]  Enc Vitals Group     BP (!) 137/97     Pulse Rate 85     Resp 18     Temp 98.8 F (37.1 C)     Temp Source Oral     SpO2 100 %     Weight 140 lb (63.5 kg)     Height 5\' 9"  (1.753 m)     Head Circumference      Peak Flow      Pain Score 8     Pain Loc      Pain Edu?      Excl. in [place]?     Constitutional: Alert and oriented. Well appearing and in no acute distress. Eyes: Conjunctivae are normal.  ENT      Head: Normocephalic and atraumatic.No facial rash, swelling or erythema.       Ears: no rash or skin changes noted.       Nose: No congestion/rhinnorhea.      Mouth/Throat: Mucous membranes are moist.Oropharynx non-erythematous. No oral lesions noted.  Neck: No stridor. Supple without meningismus.  Hematological/Lymphatic/Immunilogical: No cervical lymphadenopathy. Cardiovascular: Normal  rate, regular rhythm. Grossly normal heart sounds.  Good peripheral circulation. Respiratory: Normal respiratory effort without tachypnea nor retractions. Breath sounds are clear and equal bilaterally. No wheezes, rales, rhonchi. Gastrointestinal: Soft and nontender.  Musculoskeletal:  Ambulatory with steady gait. Movement of all extremities. Bilateral upper extremities with full range of motion.  Neurologic:  Normal speech and language. Speech is normal. No gait instability.  Skin:  Skin is warm, dry.  Except: Left upper anterior chest clustered minimally erythematous vesicular rash does not cross midline, no surrounding erythema, mild tenderness to gentle palpation, no drainage, no honey-colored crusting, no other rash noted. Appearance of healing rash present to left posterior occiput. Psychiatric: Mood and affect are normal. Speech and behavior are normal. Patient exhibits appropriate insight and judgment   ___________________________________________   LABS (all labs ordered are listed, but only abnormal results are displayed)  Labs Reviewed - No data to  display  RADIOLOGY  No results found. ____________________________________________   PROCEDURES Procedures     INITIAL IMPRESSION / ASSESSMENT AND PLAN / ED COURSE  Pertinent labs & imaging results that were available during my care of the patient were reviewed by me and considered in my medical decision making (see chart for details).  Well-appearing patient. No acute distress. Rash clinical appearance consistent with herpes zoster. Patient was recently treated for similar with Valtrex. Patient does reports she is under a lot of emotional stress. Will treat patient with Valtrex and prednisone taper. Encourage wound care and close monitoring. Encouraged follow-up with dermatology for any continuing or reoccurring issues.   [place] controlled substance database reviewed and patient with recent tramadol prescription of 30 tablets on 02/03/2017, 01/10/2017 tramadol quantity #30, and based on current 10 day prescription of most recent tramadol, patient still have tramadol at home in which she reports she does. Discussed take home tramadol as needed, patient verbalized understanding and agreed. Discussed indication, risks and benefits of medications with patient.  Discussed follow up with Primary care physician this week. Discussed follow up and return parameters including no resolution or any worsening concerns. Patient verbalized understanding and agreed to plan.   ____________________________________________   FINAL CLINICAL IMPRESSION(S) / ED DIAGNOSES  Final diagnoses:  Herpes zoster without complication     Discharge Medication List as of 02/10/2017 10:19 AM    START taking these medications   Details  predniSONE (DELTASONE) 10 MG tablet Start 60 mg po day one, then 50 mg po day two, taper by 10 mg daily until complete., Normal    valACYclovir (VALTREX) 1000 MG tablet Take 1 tablet (1,000 mg total) by mouth 3 (three) times daily., Starting Thu 02/10/2017, Until Thu  02/17/2017, Normal        Note: This dictation was prepared with Dragon dictation along with smaller phrase technology. Any transcriptional errors that result from this process are unintentional.      Marylene Land, NP 02/10/17 1042

## 2017-02-10 NOTE — Discharge Instructions (Signed)
Take medication as prescribed. Rest. Drink plenty of fluids. Keep clean. Avoid skin irritation.   Follow up with your primary care physician or dermatology this week as needed. Return to Urgent care for new or worsening concerns.

## 2017-02-18 IMAGING — US US TRANSVAGINAL NON-OB
1 series · 14 of 25 positions shown · non-contrast
Comparison: None

CLINICAL DATA: Pelvic pain for 3 days.



[Series 1: us transvaginal non-ob · 0.22mm/px · 14 of 87 slices shown]
[im 1/87]
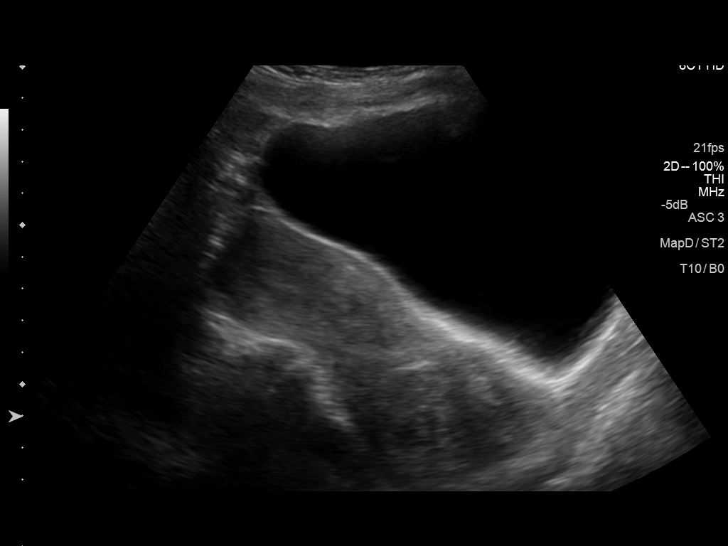
[im 8/87]
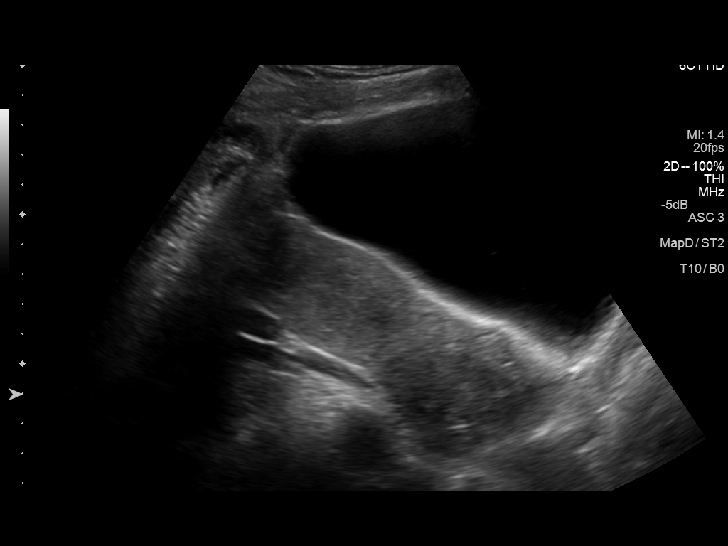
[im 15/87]
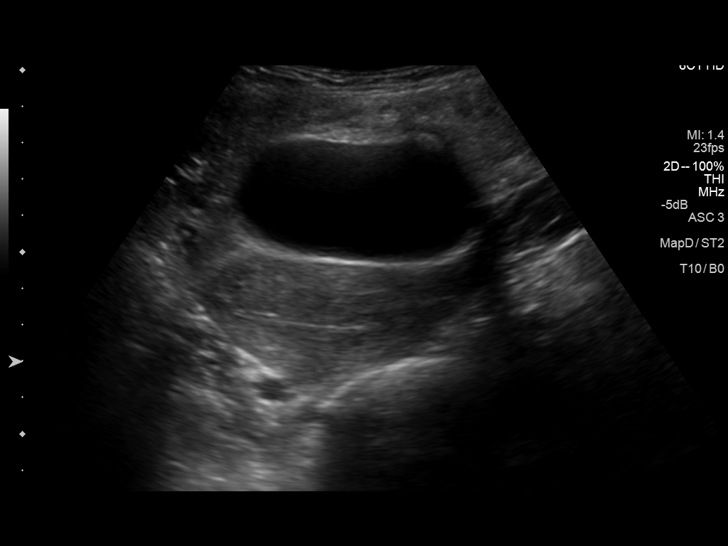
[im 22/87]
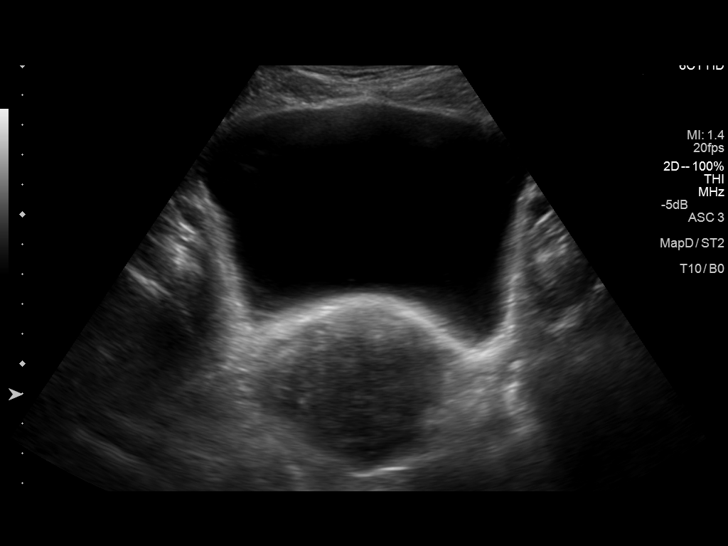
[im 29/87]
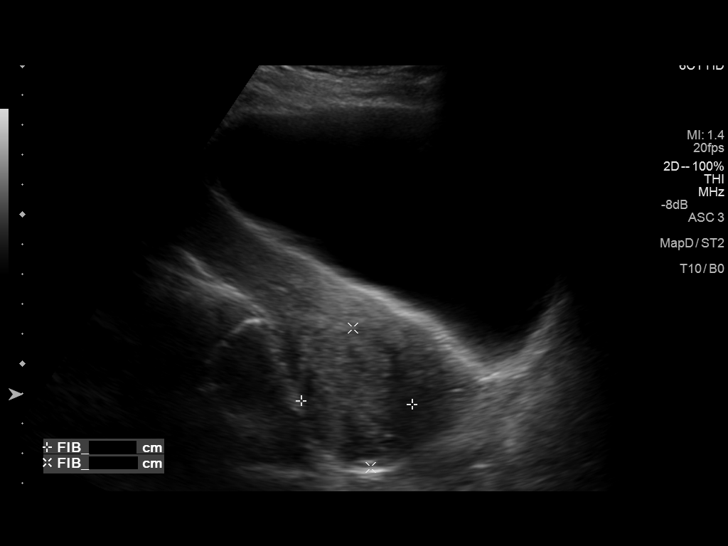
[im 33/87]
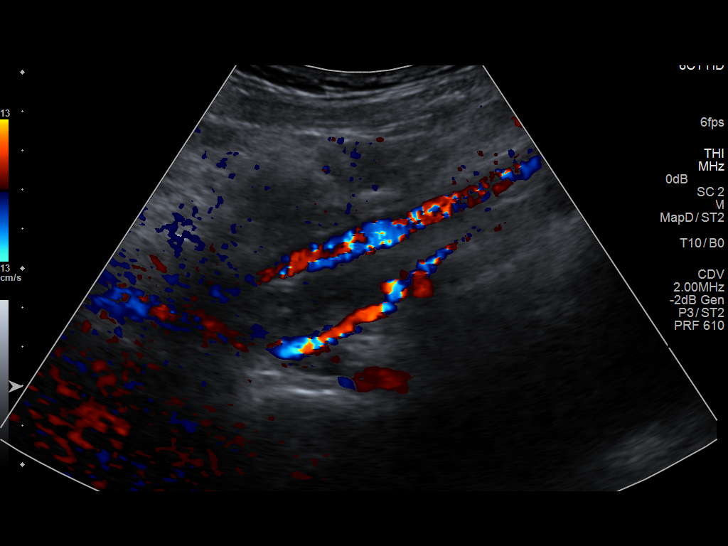
[im 40/87]
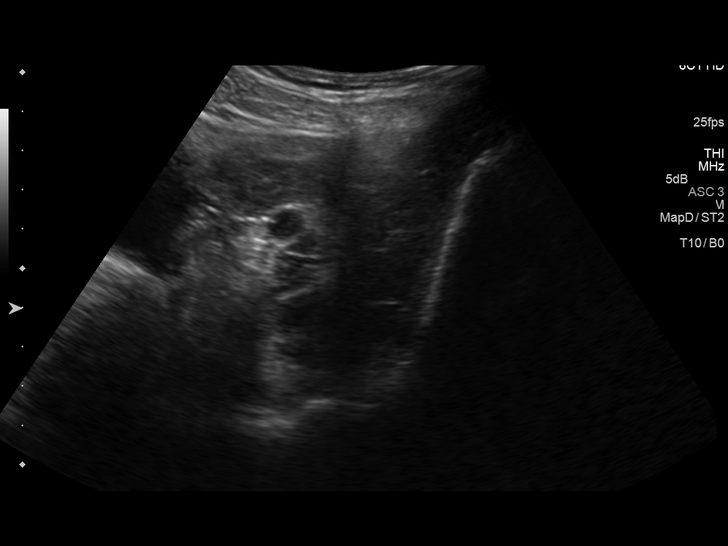
[im 47/87]
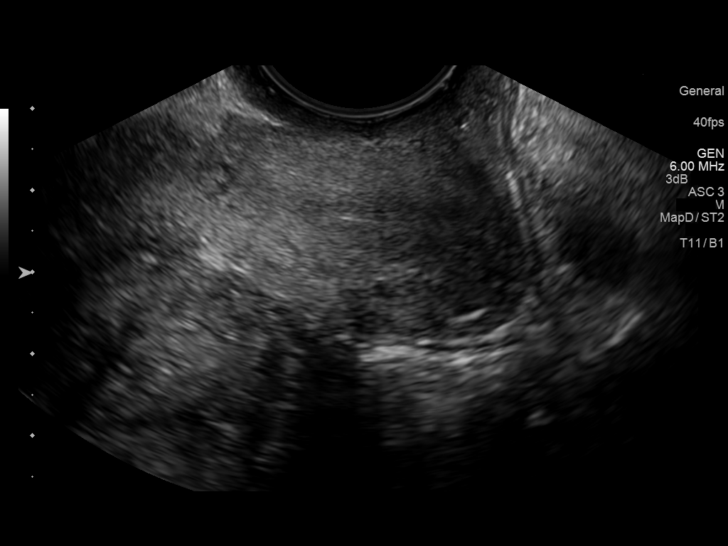
[im 54/87]
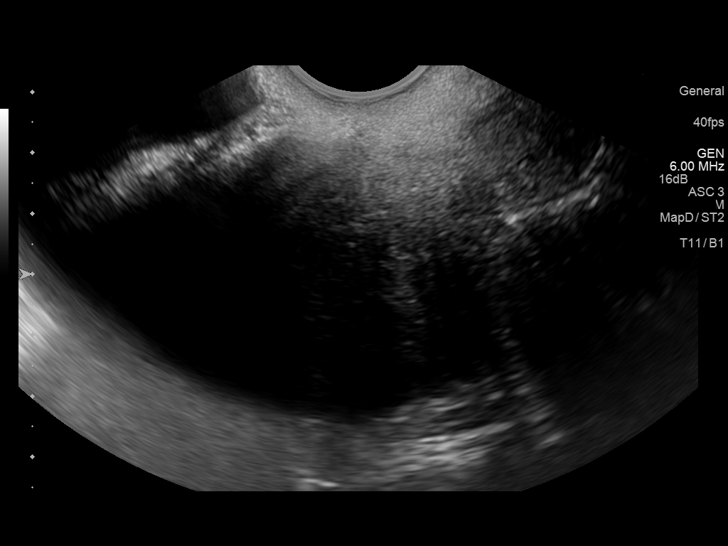
[im 58/87]
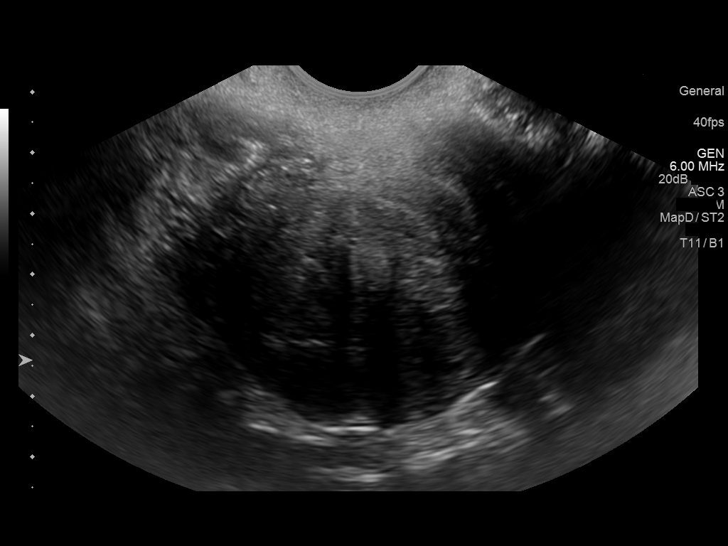
[im 65/87]
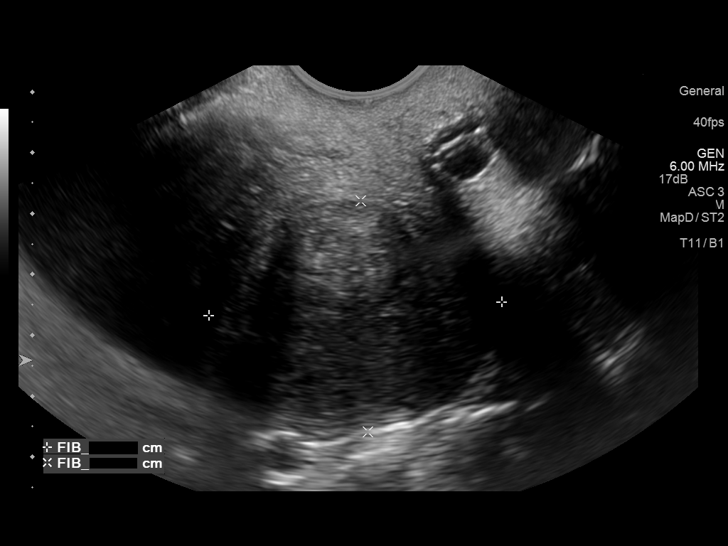
[im 72/87]
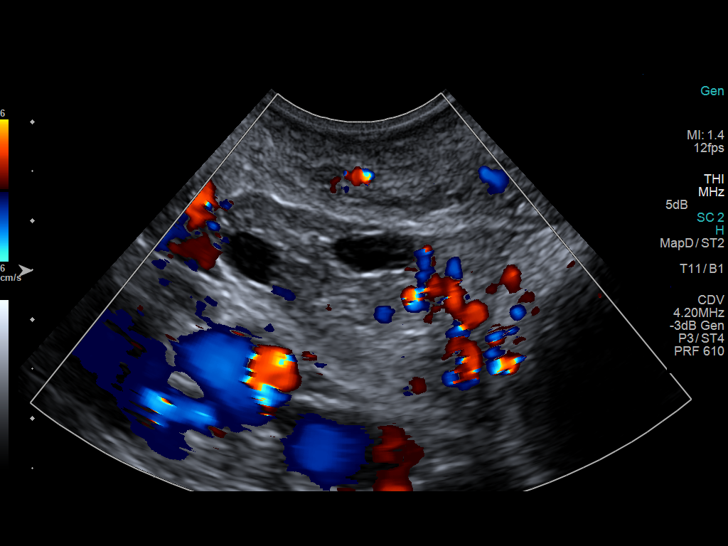
[im 79/87]
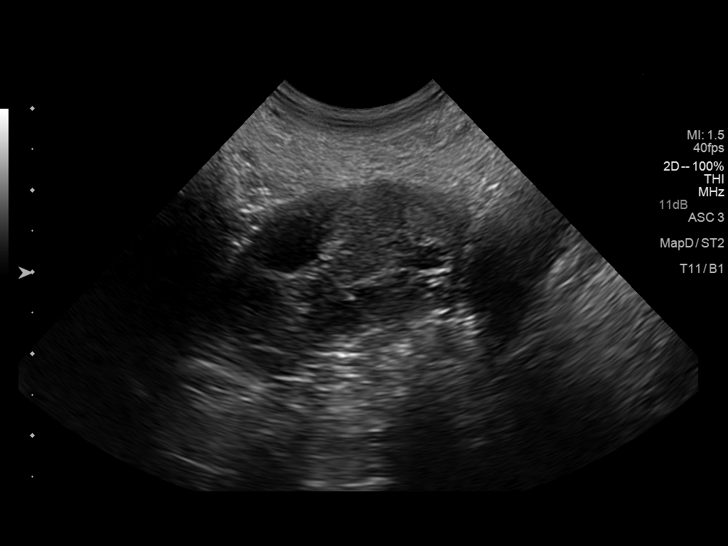
[im 87/87]
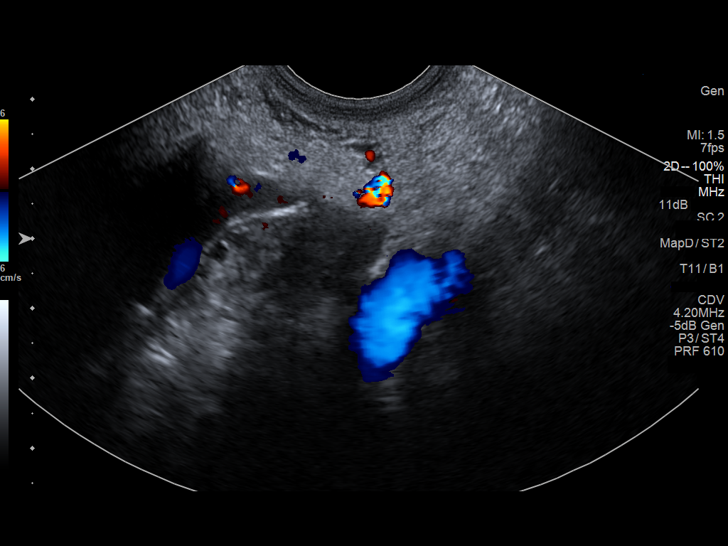

[14 of 25 positions shown; findings below may reference images not displayed]

FINDINGS: Uterus

Measurements: 10.7 x 3.5 x 6.5 cm there is a 4.8 x 3.8 x 4.9 cm
heterogeneous mass within the lower uterine segment.

Endometrium

Thickness: 6 mm. The endometrium was partially obscured by the lower
uterine segment mass.

Right ovary

Measurements: 3.8 x 2.9 x 1.77. Normal appearance/no adnexal mass.

Left ovary

Measurements: 3.1 x 3.1 x 1.6 cm. Normal appearance/no adnexal mass.

Other findings

No free fluid.
IMPRESSION: There is a 4.9 cm mass in the lower uterine segment that is most
likely a fibroid. This can be confirmed with a pelvic MRI.

## 2017-03-18 ENCOUNTER — Ambulatory Visit
Admission: EM | Admit: 2017-03-18 | Discharge: 2017-03-18 | Disposition: A | Discharge status: Home / Self Care | Payer: BLUE CROSS/BLUE SHIELD | Attending: Internal Medicine | Admitting: Internal Medicine

## 2017-03-18 ENCOUNTER — Encounter: Payer: Self-pay | Admitting: Emergency Medicine

## 2017-03-18 DIAGNOSIS — K2900 Acute gastritis without bleeding: Secondary | ICD-10-CM | POA: Diagnosis not present

## 2017-03-18 DIAGNOSIS — E860 Dehydration: Secondary | ICD-10-CM

## 2017-03-18 MED ORDER — PROMETHAZINE HCL 25 MG PO TABS: 25.0000 mg | ORAL_TABLET | Freq: Four times a day (QID) | ORAL | 0 refills | Status: DC | PRN

## 2017-03-18 MED ORDER — FAMOTIDINE 40 MG PO TABS: 40.0000 mg | ORAL_TABLET | Freq: Two times a day (BID) | ORAL | 0 refills | Status: DC

## 2017-03-18 MED ORDER — PROMETHAZINE HCL 25 MG/ML IJ SOLN
25.0000 mg | Freq: Once | INTRAMUSCULAR | Status: AC
  Administered 2017-03-18: 25 mg via INTRAMUSCULAR

## 2017-03-18 MED ORDER — SODIUM CHLORIDE 0.9 % IV BOLUS (SEPSIS)
1000.0000 mL | Freq: Once | INTRAVENOUS | Status: AC
  Administered 2017-03-18: 1000 mL via INTRAVENOUS

## 2017-03-18 MED ORDER — ONDANSETRON 8 MG PO TBDP
8.0000 mg | ORAL_TABLET | Freq: Once | ORAL | Status: AC
  Administered 2017-03-18: 8 mg via ORAL

## 2017-03-18 NOTE — ED Provider Notes (Signed)
[facility]    CSN: [id number] Arrival date & time: 03/18/17  9470     History   Chief Complaint Chief Complaint  Patient presents with  . Emesis    HPI Linda Rhodes is a 41 y.o. female. She presents today with vomiting, onset about 2 AM. This is her birthday weekend, and she went out last night for dinner, and was drinking alcohol afterwards. Vomiting started shortly after that. She has not made urine or had a bowel movement since. She has a headache. No abdominal pain, but a lot of nausea. Past history notable for hypertension, back surgery in December.    HPI  Past Medical History:  Diagnosis Date  . Abscessed tooth 09/24/2016   right lower tooth removed.  . Anemia    in past, prior to removal of uterine fibroids  . Bladder infection   . Difficult intubation   . GERD (gastroesophageal reflux disease)   . Headache    migrains  . Hypertension   . Motion sickness    all moving vehicles  . Ovarian cyst   . PONV (postoperative nausea and vomiting)     Patient Active Problem List   Diagnosis Date Noted  . S/P hysterectomy 05/06/2015    Past Surgical History:  Procedure Laterality Date  . ABDOMINAL HYSTERECTOMY    . BACK SURGERY  2016   [facility] Neurosurgery - ruptured L4-5  . CESAREAN SECTION    . CHOLECYSTECTOMY    . CYSTOSCOPY N/A 05/06/2015   Procedure: CYSTOSCOPY;  Surgeon: Aletha Halim, MD;  Location: [facility];  Service: Gynecology;  Laterality: N/A;  . ETHMOIDECTOMY Right 11/25/2015   Procedure:  ANTERIOR ETHMOIDECTOMY;  Surgeon: Carloyn Manner, MD;  Location: [place];  Service: ENT;  Laterality: Right;  . FRONTAL SINUS EXPLORATION Right 11/25/2015   Procedure: FRONTAL SINUSOTOMY;  Surgeon: Carloyn Manner, MD;  Location: [place];  Service: ENT;  Laterality: Right;  . IMAGE GUIDED SINUS SURGERY N/A 11/25/2015   Procedure: IMAGE GUIDED SINUS SURGERY WITH BALLOON;  Surgeon: Carloyn Manner, MD;  Location: [place];  Service: ENT;  Laterality: N/A;  . LAPAROSCOPIC HYSTERECTOMY N/A 05/06/2015   Procedure: HYSTERECTOMY TOTAL LAPAROSCOPIC;  Surgeon: Aletha Halim, MD;  Location: [facility];  Service: Gynecology;  Laterality: N/A;  . LUMBAR LAMINECTOMY/DECOMPRESSION MICRODISCECTOMY N/A 10/04/2016   Procedure: right minimally invasive L5 S1 microdiscectomy;  Surgeon: Bayard Hugger, MD;  Location: [facility];  Service: Neurosurgery;  Laterality: N/A;  . MAXILLARY ANTROSTOMY Right 11/25/2015   Procedure: MAXILLARY ANTROSTOMY WITH TISSUE REMOVAL;  Surgeon: Carloyn Manner, MD;  Location: [place];  Service: ENT;  Laterality: Right;  . WISDOM TOOTH EXTRACTION  10/2115   3 teeth removed.     Home Medications    Prior to Admission medications   Medication Sig Start Date End Date Taking? Authorizing Provider  acetaminophen (TYLENOL) 500 MG tablet Take 1,000 mg by mouth every 6 (six) hours as needed for mild pain.    [provider]  cyclobenzaprine (FLEXERIL) 5 MG tablet Take 1 tablet (5 mg total) by mouth 3 (three) times daily as needed for muscle spasms. 10/04/16   Edison Pace, PA-C  famotidine (PEPCID) 40 MG tablet Take 1 tablet (40 mg total) by mouth 2 (two) times daily. 03/18/17 03/23/17  Sherlene Shams, MD  Menthol, Topical Analgesic, (BIOFREEZE EX) Apply 1 application topically 3 (three) times daily as needed (back pain). Alternates between [employer] and [employer],  Historical, MD  metoprolol succinate (TOPROL-XL) 100 MG 24 hr tablet Take 25 mg by mouth daily. 08/04/16   [provider]  NICOTINE STEP 1 21 MG/24HR patch PLACE 1 PATCH (21 MG TOTAL) ONTO THE SKIN DAILY. Patient not taking: Reported on 01/10/2017 09/26/15   Juline Patch, MD  promethazine (PHENERGAN) 25 MG tablet Take 1 tablet (25 mg total) by mouth 4 (four) times daily as needed for nausea or vomiting. 03/18/17 03/21/17  Sherlene Shams, MD    Family History Family History  Problem Relation Age  of Onset  . Diabetes Mother   . Hypertension Mother   . Diabetes Father   . Hypertension Father     Social History Social History  Substance Use Topics  . Smoking status: Former Smoker    Packs/day: 0.50    Years: 7.00    Types: Cigarettes    Quit date: 10/19/2015  . Smokeless tobacco: Never Used  . Alcohol use Yes     Comment: socially - 1x/mo     Allergies   Aspirin and Adhesive [tape]   Review of Systems Review of Systems  All other systems reviewed and are negative.    Physical Exam Triage Vital Signs ED Triage Vitals  Enc Vitals Group     BP 03/18/17 0826 (!) 164/100     Pulse Rate 03/18/17 0826 84     Resp 03/18/17 0826 16     Temp 03/18/17 0826 97.7 F (36.5 C)     Temp Source 03/18/17 0826 Oral     SpO2 03/18/17 0826 100 %     Weight 03/18/17 0824 135 lb (61.2 kg)     Height 03/18/17 0824 5\' 8"  (1.727 m)     Pain Score 03/18/17 0824 7     Pain Loc --    Updated Vital Signs BP 138/84   Pulse 83   Temp 97.7 F (36.5 C) (Oral)   Resp 16   Ht 5\' 8"  (1.727 m)   Wt 135 lb (61.2 kg)   LMP  (LMP Unknown)   SpO2 99%   BMI 20.53 kg/m   Physical Exam  Constitutional: She is oriented to person, place, and time.  Sitting up on the stretcher, holding her stomach. Looks ill but not toxic. Was able to walk into the urgent care independently.  HENT:  Head: Atraumatic.  Eyes:  Conjugate gaze observed, no eye redness/discharge  Neck: Neck supple.  Cardiovascular: Normal rate and regular rhythm.   Pulmonary/Chest: No respiratory distress. She has no wheezes. She has no rales.  Lungs clear, symmetric breath sounds  Abdominal: Soft. She exhibits no distension. There is no tenderness. There is no rebound and no guarding.  Musculoskeletal: Normal range of motion.  No leg swelling  Neurological: She is alert and oriented to person, place, and time.  Speech is clear/coherent  Skin: Skin is warm and dry.  Nursing note and vitals reviewed.    UC Treatments /  Results   Procedures Procedures (including critical care time)  Medications Ordered in UC Medications  ondansetron (ZOFRAN-ODT) disintegrating tablet 8 mg (8 mg Oral Given 03/18/17 0830)  promethazine (PHENERGAN) injection 25 mg (25 mg Intramuscular Given 03/18/17 0838)  sodium chloride 0.9 % bolus 1,000 mL (1,000 mLs Intravenous New Bag/Given 03/18/17 0855)   Patient reported significant improvement in nausea/headache.  Able to sit up and tolerate oral fluid challenge.  Stable for discharge home.    Final Clinical Impressions(s) / UC Diagnoses   Final diagnoses:  Acute gastritis without hemorrhage, unspecified gastritis type  Dehydration   Anticipate gradual improvement in headache/nausea/vomiting over the next 1-2 days.  Push fluids and rest.  Prescriptions for promethazine (for nausea/vomiting) and famotidine (for stomach acid) were sent to the pharmacy.  Go to ER if vomiting/well being not improving as expected or for severe/sustained abdominal pain.  Note for work today.    New Prescriptions New Prescriptions   FAMOTIDINE (PEPCID) 40 MG TABLET    Take 1 tablet (40 mg total) by mouth 2 (two) times daily.   PROMETHAZINE (PHENERGAN) 25 MG TABLET    Take 1 tablet (25 mg total) by mouth 4 (four) times daily as needed for nausea or vomiting.     Sherlene Shams, MD 03/18/17 [phone]

## 2017-03-18 NOTE — Discharge Instructions (Signed)
Anticipate gradual improvement in headache/nausea/vomiting over the next 1-2 days.  Push fluids and rest.  Prescriptions for promethazine (for nausea/vomiting) and famotidine (for stomach acid) were sent to the pharmacy.  Go to ER if vomiting/well being not improving as expected or for severe/sustained abdominal pain.  Note for work today.

## 2017-03-18 NOTE — ED Triage Notes (Signed)
Patient c/o vomiting that started around 2am this morning. Patient reports drinking beer and had some shots last night.

## 2017-03-23 ENCOUNTER — Other Ambulatory Visit: Payer: Self-pay

## 2017-04-03 ENCOUNTER — Ambulatory Visit
Admission: EM | Admit: 2017-04-03 | Discharge: 2017-04-03 | Disposition: A | Discharge status: Home / Self Care | Payer: BLUE CROSS/BLUE SHIELD | Attending: Internal Medicine | Admitting: Internal Medicine

## 2017-04-03 ENCOUNTER — Encounter: Payer: Self-pay | Admitting: *Deleted

## 2017-04-03 DIAGNOSIS — R109 Unspecified abdominal pain: Secondary | ICD-10-CM

## 2017-04-03 DIAGNOSIS — R51 Headache: Secondary | ICD-10-CM | POA: Diagnosis not present

## 2017-04-03 DIAGNOSIS — K2920 Alcoholic gastritis without bleeding: Secondary | ICD-10-CM

## 2017-04-03 MED ORDER — GI COCKTAIL ~~LOC~~
30.0000 mL | Freq: Once | ORAL | Status: AC
  Administered 2017-04-03: 30 mL via ORAL

## 2017-04-03 MED ORDER — SODIUM CHLORIDE 0.9 % IV BOLUS (SEPSIS)
1000.0000 mL | Freq: Once | INTRAVENOUS | Status: AC
  Administered 2017-04-03: 1000 mL via INTRAVENOUS

## 2017-04-03 MED ORDER — PROMETHAZINE HCL 25 MG/ML IJ SOLN
25.0000 mg | Freq: Once | INTRAMUSCULAR | Status: AC
  Administered 2017-04-03: 25 mg via INTRAVENOUS

## 2017-04-03 MED ORDER — ONDANSETRON HCL 4 MG/2ML IJ SOLN
8.0000 mg | Freq: Once | INTRAMUSCULAR | Status: AC
  Administered 2017-04-03: 8 mg via INTRAVENOUS

## 2017-04-03 NOTE — ED Provider Notes (Signed)
[facility]    CSN: [id number] Arrival date & time: 04/03/17  0935     History   Chief Complaint Chief Complaint  Patient presents with  . Nausea  . Emesis  . Headache    HPI Linda Rhodes is a 41 y.o. female.   Pt c/o vomiting for 8 hours. Admits to drinking alcohol last night at a party. Similar episode last month. Reports blood in emesis. Admits to abdominal pain as well.       Past Medical History:  Diagnosis Date  . Abscessed tooth 09/24/2016   right lower tooth removed.  . Anemia    in past, prior to removal of uterine fibroids  . Bladder infection   . Difficult intubation   . GERD (gastroesophageal reflux disease)   . Headache    migrains  . Hypertension   . Motion sickness    all moving vehicles  . Ovarian cyst   . PONV (postoperative nausea and vomiting)     Patient Active Problem List   Diagnosis Date Noted  . S/P hysterectomy 05/06/2015    Past Surgical History:  Procedure Laterality Date  . ABDOMINAL HYSTERECTOMY    . BACK SURGERY  2016   [facility] Neurosurgery - ruptured L4-5  . CESAREAN SECTION    . CHOLECYSTECTOMY    . CYSTOSCOPY N/A 05/06/2015   Procedure: CYSTOSCOPY;  Surgeon: Aletha Halim, MD;  Location: [facility];  Service: Gynecology;  Laterality: N/A;  . ETHMOIDECTOMY Right 11/25/2015   Procedure:  ANTERIOR ETHMOIDECTOMY;  Surgeon: Carloyn Manner, MD;  Location: [place];  Service: ENT;  Laterality: Right;  . FRONTAL SINUS EXPLORATION Right 11/25/2015   Procedure: FRONTAL SINUSOTOMY;  Surgeon: Carloyn Manner, MD;  Location: [place];  Service: ENT;  Laterality: Right;  . IMAGE GUIDED SINUS SURGERY N/A 11/25/2015   Procedure: IMAGE GUIDED SINUS SURGERY WITH BALLOON;  Surgeon: Carloyn Manner, MD;  Location: [place];  Service: ENT;  Laterality: N/A;  . LAPAROSCOPIC HYSTERECTOMY N/A 05/06/2015   Procedure: HYSTERECTOMY TOTAL LAPAROSCOPIC;  Surgeon: Aletha Halim, MD;  Location: [facility];  Service: Gynecology;  Laterality: N/A;  . LUMBAR LAMINECTOMY/DECOMPRESSION MICRODISCECTOMY N/A 10/04/2016   Procedure: right minimally invasive L5 S1 microdiscectomy;  Surgeon: Bayard Hugger, MD;  Location: [facility];  Service: Neurosurgery;  Laterality: N/A;  . MAXILLARY ANTROSTOMY Right 11/25/2015   Procedure: MAXILLARY ANTROSTOMY WITH TISSUE REMOVAL;  Surgeon: Carloyn Manner, MD;  Location: [place];  Service: ENT;  Laterality: Right;  . WISDOM TOOTH EXTRACTION  10/2115   3 teeth removed.    OB History    No data available       Home Medications    Prior to Admission medications   Medication Sig Start Date End Date Taking? Authorizing Provider  acetaminophen (TYLENOL) 500 MG tablet Take 1,000 mg by mouth every 6 (six) hours as needed for mild pain.    [provider]  cyclobenzaprine (FLEXERIL) 5 MG tablet Take 1 tablet (5 mg total) by mouth 3 (three) times daily as needed for muscle spasms. 10/04/16   Edison Pace, PA-C  famotidine (PEPCID) 40 MG tablet Take 1 tablet (40 mg total) by mouth 2 (two) times daily. 03/18/17 03/23/17  Sherlene Shams, MD  Menthol, Topical Analgesic, (BIOFREEZE EX) Apply 1 application topically 3 (three) times daily as needed (back pain). Alternates between [employer] and [employer], Historical, MD  metoprolol succinate (TOPROL-XL) 100 MG 24 hr tablet Take 25  mg by mouth daily. 08/04/16   [provider]  NICOTINE STEP 1 21 MG/24HR patch PLACE 1 PATCH (21 MG TOTAL) ONTO THE SKIN DAILY. Patient not taking: Reported on 01/10/2017 09/26/15   Juline Patch, MD  promethazine (PHENERGAN) 25 MG tablet Take 1 tablet (25 mg total) by mouth 4 (four) times daily as needed for nausea or vomiting. 03/18/17 03/21/17  Sherlene Shams, MD    Family History Family History  Problem Relation Age of Onset  . Diabetes Mother   . Hypertension Mother   . Diabetes Father   . Hypertension Father     Social History Social History    Substance Use Topics  . Smoking status: Former Smoker    Packs/day: 0.50    Years: 7.00    Types: Cigarettes    Quit date: 10/19/2015  . Smokeless tobacco: Never Used  . Alcohol use Yes     Comment: socially - 1x/mo     Allergies   Aspirin and Adhesive [tape]   Review of Systems Review of Systems  Constitutional: Negative for chills and fever.  HENT: Negative for sore throat and tinnitus.   Eyes: Negative for redness.  Respiratory: Negative for cough and shortness of breath.   Cardiovascular: Negative for chest pain and palpitations.  Gastrointestinal: Positive for abdominal pain, nausea and vomiting. Negative for diarrhea.  Genitourinary: Negative for dysuria, frequency and urgency.  Musculoskeletal: Negative for myalgias.  Skin: Negative for rash.       No lesions  Neurological: Negative for weakness.  Hematological: Does not bruise/bleed easily.  Psychiatric/Behavioral: Negative for suicidal ideas.     Physical Exam Triage Vital Signs ED Triage Vitals  Enc Vitals Group     BP 04/03/17 0943 (!) 165/104     Pulse Rate 04/03/17 0943 83     Resp 04/03/17 0943 20     Temp 04/03/17 0943 97.8 F (36.6 C)     Temp Source 04/03/17 0943 Oral     SpO2 04/03/17 0943 99 %     Weight 04/03/17 0945 140 lb (63.5 kg)     Height 04/03/17 0945 5\' 7"  (1.702 m)     Head Circumference --      Peak Flow --      Pain Score --      Pain Loc --      Pain Edu? --      Excl. in [place]? --    No data found.   Updated Vital Signs BP (!) 165/104 (BP Location: Left Arm)   Pulse 83   Temp 97.8 F (36.6 C) (Oral)   Resp 20   Ht 5\' 7"  (1.702 m)   Wt 140 lb (63.5 kg)   LMP  (LMP Unknown)   SpO2 99%   BMI 21.93 kg/m   Visual Acuity Right Eye Distance:   Left Eye Distance:   Bilateral Distance:    Right Eye Near:   Left Eye Near:    Bilateral Near:     Physical Exam  Constitutional: She is oriented to person, place, and time. She appears well-developed and well-nourished. No  distress.  HENT:  Head: Normocephalic and atraumatic.  Mouth/Throat: Oropharynx is clear and moist.  Eyes: Conjunctivae and EOM are normal. Pupils are equal, round, and reactive to light. No scleral icterus.  Neck: Normal range of motion. Neck supple. No JVD present. No tracheal deviation present. No thyromegaly present.  Cardiovascular: Normal rate, regular rhythm and normal heart sounds.  Exam reveals no  gallop and no friction rub.   No murmur heard. Pulmonary/Chest: Effort normal and breath sounds normal.  Abdominal: Soft. Bowel sounds are normal. She exhibits no distension. There is no tenderness.  Musculoskeletal: Normal range of motion. She exhibits no edema.  Lymphadenopathy:    She has no cervical adenopathy.  Neurological: She is alert and oriented to person, place, and time. No cranial nerve deficit.  Skin: Skin is warm and dry.  Psychiatric: She has a normal mood and affect. Her behavior is normal. Judgment and thought content normal.  Nursing note and vitals reviewed.    UC Treatments / Results  Labs (all labs ordered are listed, but only abnormal results are displayed) Labs Reviewed - No data to display  EKG  EKG Interpretation None       Radiology No results found.  Procedures Procedures (including critical care time)  Medications Ordered in UC Medications  ondansetron (ZOFRAN) injection 8 mg (not administered)  sodium chloride 0.9 % bolus 1,000 mL (not administered)     Initial Impression / Assessment and Plan / UC Course  I have reviewed the triage vital signs and the nursing notes.  Pertinent labs & imaging results that were available during my care of the patient were reviewed by me and considered in my medical decision making (see chart for details).     Emesis dark brown, likely indicates gastritis. Cannot illicit abdominal pain on exam.  Appears remarkably better after 2L NS, Zofran and phenergan.  BRAT diet. Encourage oral rehydration  Final  Clinical Impressions(s) / UC Diagnoses   Final diagnoses:  None    New Prescriptions New Prescriptions   No medications on file     Harrie Foreman, MD 04/03/17 1112

## 2017-04-03 NOTE — ED Triage Notes (Signed)
N/V, and headache since 4am today. Denies pain or other symptoms. Similar episode 1 month ago.

## 2017-06-07 ENCOUNTER — Ambulatory Visit
Admission: EM | Admit: 2017-06-07 | Discharge: 2017-06-07 | Disposition: A | Discharge status: Home / Self Care | Payer: BLUE CROSS/BLUE SHIELD | Attending: Family Medicine | Admitting: Family Medicine

## 2017-06-07 ENCOUNTER — Encounter: Payer: Self-pay | Admitting: *Deleted

## 2017-06-07 DIAGNOSIS — B029 Zoster without complications: Secondary | ICD-10-CM | POA: Diagnosis not present

## 2017-06-07 DIAGNOSIS — R21 Rash and other nonspecific skin eruption: Secondary | ICD-10-CM | POA: Diagnosis not present

## 2017-06-07 MED ORDER — VALACYCLOVIR HCL 1 G PO TABS: 1000.0000 mg | ORAL_TABLET | Freq: Three times a day (TID) | ORAL | 0 refills | Status: DC

## 2017-06-07 NOTE — ED Provider Notes (Signed)
[facility]    CSN: [id number] Arrival date & time: 06/07/17  1642     History   Chief Complaint Chief Complaint  Patient presents with  . Rash    HPI Linda Rhodes is a 41 y.o. female.   Patient is a 41 year old black female who states that she's had multiple episodes she was before. She reports new episode of shingles that started yesterday. She states that she try to get in to see her PCP but was unable to get a appointment. She states this is not the first time she has had shingles and warmth noticed something that she can do that will make a difference.   The history is provided by the patient.  Rash  Location:  Shoulder/arm and head/neck Shoulder/arm rash location:  L shoulder and L upper arm (L chest) Quality: blistering and painful   Pain details:    Quality:  Aching   Severity:  Moderate   Timing:  Constant   Progression:  Worsening Severity:  Moderate Duration:  2 days Timing:  Constant Progression:  Worsening Chronicity:  New Relieved by:  Nothing Worsened by:  Nothing Associated symptoms: no abdominal pain     Past Medical History:  Diagnosis Date  . Abscessed tooth 09/24/2016   right lower tooth removed.  . Anemia    in past, prior to removal of uterine fibroids  . Bladder infection   . Difficult intubation   . GERD (gastroesophageal reflux disease)   . Headache    migrains  . Hypertension   . Motion sickness    all moving vehicles  . Ovarian cyst   . PONV (postoperative nausea and vomiting)     Patient Active Problem List   Diagnosis Date Noted  . S/P hysterectomy 05/06/2015    Past Surgical History:  Procedure Laterality Date  . ABDOMINAL HYSTERECTOMY    . BACK SURGERY  2016   [facility] Neurosurgery - ruptured L4-5  . CESAREAN SECTION    . CHOLECYSTECTOMY    . CYSTOSCOPY N/A 05/06/2015   Procedure: CYSTOSCOPY;  Surgeon: Aletha Halim, MD;  Location: [facility];  Service: Gynecology;  Laterality: N/A;  . ETHMOIDECTOMY  Right 11/25/2015   Procedure:  ANTERIOR ETHMOIDECTOMY;  Surgeon: Carloyn Manner, MD;  Location: [place];  Service: ENT;  Laterality: Right;  . FRONTAL SINUS EXPLORATION Right 11/25/2015   Procedure: FRONTAL SINUSOTOMY;  Surgeon: Carloyn Manner, MD;  Location: [place];  Service: ENT;  Laterality: Right;  . IMAGE GUIDED SINUS SURGERY N/A 11/25/2015   Procedure: IMAGE GUIDED SINUS SURGERY WITH BALLOON;  Surgeon: Carloyn Manner, MD;  Location: [place];  Service: ENT;  Laterality: N/A;  . LAPAROSCOPIC HYSTERECTOMY N/A 05/06/2015   Procedure: HYSTERECTOMY TOTAL LAPAROSCOPIC;  Surgeon: Aletha Halim, MD;  Location: [facility];  Service: Gynecology;  Laterality: N/A;  . LUMBAR LAMINECTOMY/DECOMPRESSION MICRODISCECTOMY N/A 10/04/2016   Procedure: right minimally invasive L5 S1 microdiscectomy;  Surgeon: Bayard Hugger, MD;  Location: [facility];  Service: Neurosurgery;  Laterality: N/A;  . MAXILLARY ANTROSTOMY Right 11/25/2015   Procedure: MAXILLARY ANTROSTOMY WITH TISSUE REMOVAL;  Surgeon: Carloyn Manner, MD;  Location: [place];  Service: ENT;  Laterality: Right;  . WISDOM TOOTH EXTRACTION  10/2115   3 teeth removed.    OB History    No data available       Home Medications    Prior to Admission medications   Medication Sig Start Date End Date Taking? Authorizing Provider  acetaminophen (TYLENOL)  500 MG tablet Take 1,000 mg by mouth every 6 (six) hours as needed for mild pain.   Yes [provider]  metoprolol succinate (TOPROL-XL) 100 MG 24 hr tablet Take 25 mg by mouth daily. 08/04/16  Yes [provider]  NICOTINE STEP 1 21 MG/24HR patch PLACE 1 PATCH (21 MG TOTAL) ONTO THE SKIN DAILY. 09/26/15  Yes Juline Patch, MD  varenicline (CHANTIX) 1 MG tablet Take 1 mg by mouth 2 (two) times daily.   Yes [provider]  cyclobenzaprine (FLEXERIL) 5 MG tablet Take 1 tablet (5 mg total) by mouth 3 (three) times daily as needed for  muscle spasms. 10/04/16   Edison Pace, PA-C  famotidine (PEPCID) 40 MG tablet Take 1 tablet (40 mg total) by mouth 2 (two) times daily. 03/18/17 03/23/17  Sherlene Shams, MD  Menthol, Topical Analgesic, (BIOFREEZE EX) Apply 1 application topically 3 (three) times daily as needed (back pain). Alternates between [employer] and [employer], Historical, MD  promethazine (PHENERGAN) 25 MG tablet Take 1 tablet (25 mg total) by mouth 4 (four) times daily as needed for nausea or vomiting. 03/18/17 03/21/17  Sherlene Shams, MD  valACYclovir (VALTREX) 1000 MG tablet Take 1 tablet (1,000 mg total) by mouth 3 (three) times daily. 06/07/17   Frederich Cha, MD    Family History Family History  Problem Relation Age of Onset  . Diabetes Mother   . Hypertension Mother   . Diabetes Father   . Hypertension Father     Social History Social History  Substance Use Topics  . Smoking status: Former Smoker    Packs/day: 0.50    Years: 7.00    Types: Cigarettes    Quit date: 10/19/2015  . Smokeless tobacco: Never Used  . Alcohol use Yes     Comment: socially - 1x/mo     Allergies   Aspirin and Adhesive [tape]   Review of Systems Review of Systems  Gastrointestinal: Negative for abdominal pain.  Skin: Positive for rash.  All other systems reviewed and are negative.    Physical Exam Triage Vital Signs ED Triage Vitals  Enc Vitals Group     BP 06/07/17 1810 (!) 142/89     Pulse Rate 06/07/17 1810 84     Resp 06/07/17 1810 16     Temp 06/07/17 1810 98.5 F (36.9 C)     Temp Source 06/07/17 1810 Oral     SpO2 06/07/17 1810 100 %     Weight 06/07/17 1812 140 lb (63.5 kg)     Height 06/07/17 1812 5\' 7"  (1.702 m)     Head Circumference --      Peak Flow --      Pain Score 06/07/17 1812 4     Pain Loc --      Pain Edu? --      Excl. in [place]? --    No data found.   Updated Vital Signs BP (!) 142/89 (BP Location: Left Arm)   Pulse 84   Temp 98.5 F (36.9 C) (Oral)   Resp 16   Ht  5\' 7"  (1.702 m)   Wt 140 lb (63.5 kg)   LMP  (LMP Unknown)   SpO2 100%   BMI 21.93 kg/m   Visual Acuity Right Eye Distance:   Left Eye Distance:   Bilateral Distance:    Right Eye Near:   Left Eye Near:    Bilateral Near:     Physical  Exam  Constitutional: She is oriented to person, place, and time. She appears well-developed and well-nourished.  HENT:  Head: Normocephalic and atraumatic.  Right Ear: External ear normal.  Left Ear: External ear normal.  Eyes: Pupils are equal, round, and reactive to light.  Neck: Normal range of motion.  Musculoskeletal: Normal range of motion.  Neurological: She is alert and oriented to person, place, and time.  Skin: Rash noted. There is erythema.  Rash goes from the anterior left chest near the sternum up towards the left shoulder and around the back all consistent with shingles she has some scarring on that area apparently the shingles has reactivated in that area before.  Psychiatric: She has a normal mood and affect.  Vitals reviewed.    UC Treatments / Results  Labs (all labs ordered are listed, but only abnormal results are displayed) Labs Reviewed - No data to display  EKG  EKG Interpretation None       Radiology No results found.  Procedures Procedures (including critical care time)  Medications Ordered in UC Medications - No data to display   Initial Impression / Assessment and Plan / UC Course  I have reviewed the triage vital signs and the nursing notes.  Pertinent labs & imaging results that were available during my care of the patient were reviewed by me and considered in my medical decision making (see chart for details).     Strongly recommend she contact her PCPs office to try to be seen for vaccination to try to prevent further shingles infection. If her PCP cannot see her in acute illness she still needs to make appointments she can discuss this future prevention .  Offered work note but she declined     Final Clinical Impressions(s) / UC Diagnoses   Final diagnoses:  Herpes zoster without complication  Rash    New Prescriptions New Prescriptions   VALACYCLOVIR (VALTREX) 1000 MG TABLET    Take 1 tablet (1,000 mg total) by mouth 3 (three) times daily.     Controlled Substance Prescriptions [place] Controlled Substance Registry consulted? Not Applicable   Frederich Cha, MD 06/07/17 [phone]

## 2017-06-07 NOTE — ED Triage Notes (Signed)
Painful rash to left chest. Previous hx of shingles and pt relates similar symptoms.

## 2017-06-08 ENCOUNTER — Ambulatory Visit
Admission: EM | Admit: 2017-06-08 | Discharge: 2017-06-08 | Disposition: A | Discharge status: Home / Self Care | Payer: BLUE CROSS/BLUE SHIELD | Attending: Family Medicine | Admitting: Family Medicine

## 2017-06-08 DIAGNOSIS — B029 Zoster without complications: Secondary | ICD-10-CM

## 2017-06-08 MED ORDER — HYDROCODONE-ACETAMINOPHEN 5-325 MG PO TABS: ORAL_TABLET | ORAL | 0 refills | Status: DC

## 2017-06-08 NOTE — ED Triage Notes (Signed)
Patient states that she was seen yesterday here by Dr. Alveta Heimlich and was told she had shingles. Patient states that she is in a great deal of pain on her left chest at the rash sight.

## 2017-06-08 NOTE — ED Provider Notes (Signed)
[facility]    CSN: [id number] Arrival date & time: 06/08/17  1446     History   Chief Complaint Chief Complaint  Patient presents with  . Herpes Zoster    HPI Linda Rhodes is a 41 y.o. female.   41 yo female with a c/o pain to shingles rash. Patient states she was seen here yesterday, diagnosed with shingles and given Valtrex, however patient states the pain is severe and over the counter analgesics are not helping.    The history is provided by the patient.    Past Medical History:  Diagnosis Date  . Abscessed tooth 09/24/2016   right lower tooth removed.  . Anemia    in past, prior to removal of uterine fibroids  . Bladder infection   . Difficult intubation   . GERD (gastroesophageal reflux disease)   . Headache    migrains  . Hypertension   . Motion sickness    all moving vehicles  . Ovarian cyst   . PONV (postoperative nausea and vomiting)     Patient Active Problem List   Diagnosis Date Noted  . S/P hysterectomy 05/06/2015    Past Surgical History:  Procedure Laterality Date  . ABDOMINAL HYSTERECTOMY    . BACK SURGERY  2016   [facility] Neurosurgery - ruptured L4-5  . CESAREAN SECTION    . CHOLECYSTECTOMY    . CYSTOSCOPY N/A 05/06/2015   Procedure: CYSTOSCOPY;  Surgeon: Aletha Halim, MD;  Location: [facility];  Service: Gynecology;  Laterality: N/A;  . ETHMOIDECTOMY Right 11/25/2015   Procedure:  ANTERIOR ETHMOIDECTOMY;  Surgeon: Carloyn Manner, MD;  Location: [place];  Service: ENT;  Laterality: Right;  . FRONTAL SINUS EXPLORATION Right 11/25/2015   Procedure: FRONTAL SINUSOTOMY;  Surgeon: Carloyn Manner, MD;  Location: [place];  Service: ENT;  Laterality: Right;  . IMAGE GUIDED SINUS SURGERY N/A 11/25/2015   Procedure: IMAGE GUIDED SINUS SURGERY WITH BALLOON;  Surgeon: Carloyn Manner, MD;  Location: [place];  Service: ENT;  Laterality: N/A;  . LAPAROSCOPIC HYSTERECTOMY N/A 05/06/2015   Procedure:  HYSTERECTOMY TOTAL LAPAROSCOPIC;  Surgeon: Aletha Halim, MD;  Location: [facility];  Service: Gynecology;  Laterality: N/A;  . LUMBAR LAMINECTOMY/DECOMPRESSION MICRODISCECTOMY N/A 10/04/2016   Procedure: right minimally invasive L5 S1 microdiscectomy;  Surgeon: Bayard Hugger, MD;  Location: [facility];  Service: Neurosurgery;  Laterality: N/A;  . MAXILLARY ANTROSTOMY Right 11/25/2015   Procedure: MAXILLARY ANTROSTOMY WITH TISSUE REMOVAL;  Surgeon: Carloyn Manner, MD;  Location: [place];  Service: ENT;  Laterality: Right;  . WISDOM TOOTH EXTRACTION  10/2115   3 teeth removed.    OB History    No data available       Home Medications    Prior to Admission medications   Medication Sig Start Date End Date Taking? Authorizing Provider  acetaminophen (TYLENOL) 500 MG tablet Take 1,000 mg by mouth every 6 (six) hours as needed for mild pain.   Yes [provider]  cyclobenzaprine (FLEXERIL) 5 MG tablet Take 1 tablet (5 mg total) by mouth 3 (three) times daily as needed for muscle spasms. 10/04/16  Yes Drinkwater, Fleeta Emmer, PA-C  Menthol, Topical Analgesic, (BIOFREEZE EX) Apply 1 application topically 3 (three) times daily as needed (back pain). Alternates between [employer] and [employer], Historical, MD  metoprolol succinate (TOPROL-XL) 100 MG 24 hr tablet Take 25 mg by mouth daily. 08/04/16  Yes [provider]  NICOTINE STEP 1  21 MG/24HR patch PLACE 1 PATCH (21 MG TOTAL) ONTO THE SKIN DAILY. 09/26/15  Yes Juline Patch, MD  valACYclovir (VALTREX) 1000 MG tablet Take 1 tablet (1,000 mg total) by mouth 3 (three) times daily. 06/07/17  Yes Frederich Cha, MD  varenicline (CHANTIX) 1 MG tablet Take 1 mg by mouth 2 (two) times daily.   Yes [provider]  famotidine (PEPCID) 40 MG tablet Take 1 tablet (40 mg total) by mouth 2 (two) times daily. 03/18/17 03/23/17  Sherlene Shams, MD  HYDROcodone-acetaminophen (NORCO/VICODIN) 5-325 MG tablet 1-2 tabs po bid  prn 06/08/17   Norval Gable, MD  promethazine (PHENERGAN) 25 MG tablet Take 1 tablet (25 mg total) by mouth 4 (four) times daily as needed for nausea or vomiting. 03/18/17 03/21/17  Sherlene Shams, MD    Family History Family History  Problem Relation Age of Onset  . Diabetes Mother   . Hypertension Mother   . Diabetes Father   . Hypertension Father     Social History Social History  Substance Use Topics  . Smoking status: Former Smoker    Packs/day: 0.50    Years: 7.00    Types: Cigarettes    Quit date: 10/19/2015  . Smokeless tobacco: Never Used  . Alcohol use Yes     Comment: socially - 1x/mo     Allergies   Aspirin and Adhesive [tape]   Review of Systems Review of Systems   Physical Exam Triage Vital Signs ED Triage Vitals  Enc Vitals Group     BP 06/08/17 1556 (!) 146/108     Pulse Rate 06/08/17 1556 79     Resp 06/08/17 1556 18     Temp 06/08/17 1556 98.4 F (36.9 C)     Temp Source 06/08/17 1556 Oral     SpO2 06/08/17 1556 100 %     Weight 06/08/17 1554 140 lb (63.5 kg)     Height 06/08/17 1554 5\' 7"  (1.702 m)     Head Circumference --      Peak Flow --      Pain Score 06/08/17 1554 6     Pain Loc --      Pain Edu? --      Excl. in [place]? --    No data found.   Updated Vital Signs BP (!) 146/108 (BP Location: Left Arm)   Pulse 79   Temp 98.4 F (36.9 C) (Oral)   Resp 18   Ht 5\' 7"  (1.702 m)   Wt 140 lb (63.5 kg)   LMP  (LMP Unknown)   SpO2 100%   BMI 21.93 kg/m   Visual Acuity Right Eye Distance:   Left Eye Distance:   Bilateral Distance:    Right Eye Near:   Left Eye Near:    Bilateral Near:     Physical Exam  Constitutional: She appears well-developed and well-nourished. No distress.  Skin: Rash (over left upper lateral chest skin) noted. She is not diaphoretic.  Nursing note and vitals reviewed.    UC Treatments / Results  Labs (all labs ordered are listed, but only abnormal results are displayed) Labs Reviewed - No data to  display  EKG  EKG Interpretation None       Radiology No results found.  Procedures Procedures (including critical care time)  Medications Ordered in UC Medications - No data to display   Initial Impression / Assessment and Plan / UC Course  I have reviewed the triage vital signs and  the nursing notes.  Pertinent labs & imaging results that were available during my care of the patient were reviewed by me and considered in my medical decision making (see chart for details).      Final Clinical Impressions(s) / UC Diagnoses   Final diagnoses:  Herpes zoster without complication    New Prescriptions Discharge Medication List as of 06/08/2017  4:18 PM    START taking these medications   Details  HYDROcodone-acetaminophen (NORCO/VICODIN) 5-325 MG tablet 1-2 tabs po bid prn, Print       1. diagnosis reviewed with patient 2. rx as per orders above; reviewed possible side effects, interactions, risks and benefits  3. Recommend continue and finish valtrex rx 4. Follow-up prn if symptoms worsen or don't improve  Controlled Substance Prescriptions [place] Controlled Substance Registry consulted? Yes, I have consulted the [place] Controlled Substances Registry for this patient, and feel the risk/benefit ratio today is favorable for proceeding with this prescription for a controlled substance.   Norval Gable, MD 06/08/17 [phone]

## 2017-08-06 ENCOUNTER — Ambulatory Visit
Admission: EM | Admit: 2017-08-06 | Discharge: 2017-08-06 | Disposition: A | Discharge status: Home / Self Care | Payer: BLUE CROSS/BLUE SHIELD | Attending: Family Medicine | Admitting: Family Medicine

## 2017-08-06 DIAGNOSIS — B029 Zoster without complications: Secondary | ICD-10-CM

## 2017-08-06 MED ORDER — GABAPENTIN 300 MG PO CAPS: 300.0000 mg | ORAL_CAPSULE | Freq: Three times a day (TID) | ORAL | 0 refills | Status: DC

## 2017-08-06 MED ORDER — VALACYCLOVIR HCL 1 G PO TABS: 1000.0000 mg | ORAL_TABLET | Freq: Three times a day (TID) | ORAL | 0 refills | Status: DC

## 2017-08-06 NOTE — ED Provider Notes (Signed)
[facility]    CSN: [id number] Arrival date & time: 08/06/17  1041  History   Chief Complaint Chief Complaint  Patient presents with  . Rash  . Dizziness   HPI  41 year old female presents with recurrent shingles.  Patient reports that she has had shingles previously. She has scarring in the affected area. She states on Thursday, she had a recurrence of her rash. Located on the left anterior chest. Associated itching and burning as well as tingling. She states that her pain is quite severe at times. She's also had associated headache. She's been scratching the area. No known exacerbating or relieving factors. No other associated symptoms. No other complaints at this time.  Past Medical History:  Diagnosis Date  . Abscessed tooth 09/24/2016   right lower tooth removed.  . Anemia    in past, prior to removal of uterine fibroids  . Bladder infection   . Difficult intubation   . GERD (gastroesophageal reflux disease)   . Headache    migrains  . Hypertension   . Motion sickness    all moving vehicles  . Ovarian cyst   . PONV (postoperative nausea and vomiting)     Patient Active Problem List   Diagnosis Date Noted  . S/P hysterectomy 05/06/2015    Past Surgical History:  Procedure Laterality Date  . ABDOMINAL HYSTERECTOMY    . BACK SURGERY  2016   [facility] Neurosurgery - ruptured L4-5  . CESAREAN SECTION    . CHOLECYSTECTOMY    . CYSTOSCOPY N/A 05/06/2015   Procedure: CYSTOSCOPY;  Surgeon: Aletha Halim, MD;  Location: [facility];  Service: Gynecology;  Laterality: N/A;  . ETHMOIDECTOMY Right 11/25/2015   Procedure:  ANTERIOR ETHMOIDECTOMY;  Surgeon: Carloyn Manner, MD;  Location: [place];  Service: ENT;  Laterality: Right;  . FRONTAL SINUS EXPLORATION Right 11/25/2015   Procedure: FRONTAL SINUSOTOMY;  Surgeon: Carloyn Manner, MD;  Location: [place];  Service: ENT;  Laterality: Right;  . IMAGE GUIDED SINUS SURGERY N/A 11/25/2015   Procedure: IMAGE GUIDED SINUS SURGERY WITH BALLOON;  Surgeon: Carloyn Manner, MD;  Location: [place];  Service: ENT;  Laterality: N/A;  . LAPAROSCOPIC HYSTERECTOMY N/A 05/06/2015   Procedure: HYSTERECTOMY TOTAL LAPAROSCOPIC;  Surgeon: Aletha Halim, MD;  Location: [facility];  Service: Gynecology;  Laterality: N/A;  . LUMBAR LAMINECTOMY/DECOMPRESSION MICRODISCECTOMY N/A 10/04/2016   Procedure: right minimally invasive L5 S1 microdiscectomy;  Surgeon: Bayard Hugger, MD;  Location: [facility];  Service: Neurosurgery;  Laterality: N/A;  . MAXILLARY ANTROSTOMY Right 11/25/2015   Procedure: MAXILLARY ANTROSTOMY WITH TISSUE REMOVAL;  Surgeon: Carloyn Manner, MD;  Location: [place];  Service: ENT;  Laterality: Right;  . WISDOM TOOTH EXTRACTION  10/2115   3 teeth removed.    OB History    No data available     Home Medications    Prior to Admission medications   Medication Sig Start Date End Date Taking? Authorizing Provider  acetaminophen (TYLENOL) 500 MG tablet Take 1,000 mg by mouth every 6 (six) hours as needed for mild pain.   Yes [provider]  Menthol, Topical Analgesic, (BIOFREEZE EX) Apply 1 application topically 3 (three) times daily as needed (back pain). Alternates between [employer] and [employer], Historical, MD  metoprolol succinate (TOPROL-XL) 100 MG 24 hr tablet Take 25 mg by mouth daily. 08/04/16  Yes [provider]  NICOTINE STEP 1 21 MG/24HR patch PLACE 1 PATCH (21 MG TOTAL)  ONTO THE SKIN DAILY. 09/26/15  Yes Juline Patch, MD  varenicline (CHANTIX) 1 MG tablet Take 1 mg by mouth 2 (two) times daily.   Yes [provider]  gabapentin (NEURONTIN) 300 MG capsule Take 1 capsule (300 mg total) by mouth 3 (three) times daily. 08/06/17   Coral Spikes, DO  valACYclovir (VALTREX) 1000 MG tablet Take 1 tablet (1,000 mg total) by mouth 3 (three) times daily. 08/06/17   Coral Spikes, DO    Family History Family History    Problem Relation Age of Onset  . Diabetes Mother   . Hypertension Mother   . Diabetes Father   . Hypertension Father     Social History Social History  Substance Use Topics  . Smoking status: Former Smoker    Packs/day: 0.50    Years: 7.00    Types: Cigarettes    Quit date: 10/19/2015  . Smokeless tobacco: Never Used  . Alcohol use Yes     Comment: socially - 1x/mo   Allergies   Aspirin and Adhesive [tape]   Review of Systems Review of Systems  Constitutional: Negative.   Skin: Positive for rash.  Neurological: Positive for headaches.   Physical Exam Triage Vital Signs ED Triage Vitals  Enc Vitals Group     BP 08/06/17 1056 (!) 148/100     Pulse Rate 08/06/17 1056 85     Resp 08/06/17 1056 18     Temp 08/06/17 1056 98.9 F (37.2 C)     Temp Source 08/06/17 1056 Oral     SpO2 08/06/17 1056 97 %     Weight 08/06/17 1058 145 lb (65.8 kg)     Height --      Head Circumference --      Peak Flow --      Pain Score 08/06/17 1058 4     Pain Loc --      Pain Edu? --      Excl. in [place]? --    Updated Vital Signs BP (!) 148/100 (BP Location: Left Arm)   Pulse 85   Temp 98.9 F (37.2 C) (Oral)   Resp 18   Wt 145 lb (65.8 kg)   LMP  (LMP Unknown)   SpO2 97%   BMI 22.71 kg/m   Physical Exam  Constitutional: She is oriented to person, place, and time. She appears well-developed. No distress.  Cardiovascular: Normal rate and regular rhythm.   No murmur heard. Pulmonary/Chest: Effort normal. She has no wheezes. She has no rales.  Neurological: She is alert and oriented to person, place, and time.  Skin:     Left anterior chest - scarring noted. Raised papules noted.  Psychiatric: She has a normal mood and affect.  Vitals reviewed.  UC Treatments / Results  Labs (all labs ordered are listed, but only abnormal results are displayed) Labs Reviewed - No data to display  EKG  EKG Interpretation None       Radiology No results  found.  Procedures Procedures (including critical care time)  Medications Ordered in UC Medications - No data to display   Initial Impression / Assessment and Plan / UC Course  I have reviewed the triage vital signs and the nursing notes.  Pertinent labs & imaging results that were available during my care of the patient were reviewed by me and considered in my medical decision making (see chart for details).    41 year old female presents with with recurrent herpes zoster. Treating  with Valtrex and Gabapentin.  Final Clinical Impressions(s) / UC Diagnoses   Final diagnoses:  Herpes zoster without complication   New Prescriptions Discharge Medication List as of 08/06/2017 11:34 AM    START taking these medications   Details  gabapentin (NEURONTIN) 300 MG capsule Take 1 capsule (300 mg total) by mouth 3 (three) times daily., Starting Sat 08/06/2017, Normal    valACYclovir (VALTREX) 1000 MG tablet Take 1 tablet (1,000 mg total) by mouth 3 (three) times daily., Starting Sat 08/06/2017, Normal       Controlled Substance Prescriptions [place] Controlled Substance Registry consulted? Not Applicable   Coral Spikes, DO 08/06/17 1157

## 2017-08-06 NOTE — ED Triage Notes (Signed)
Pt has a rash on her left shoulder and said she thinks she has shingles, did start thur. Night and said she was taking a OTC supplement but it didn't resolve the issue. Also complains of headache.

## 2017-08-06 NOTE — Discharge Instructions (Signed)
Medications as prescribed. ° °Take care ° °Dr. Kenita Bines  °

## 2017-08-23 ENCOUNTER — Other Ambulatory Visit: Payer: Self-pay | Admitting: Neurological Surgery

## 2017-08-23 DIAGNOSIS — Z9889 Other specified postprocedural states: Secondary | ICD-10-CM

## 2017-08-30 ENCOUNTER — Ambulatory Visit
Admission: RE | Admit: 2017-08-30 | Discharge: 2017-08-30 | Disposition: A | Discharge status: Home / Self Care | Payer: BLUE CROSS/BLUE SHIELD | Source: Ambulatory Visit | Attending: Neurological Surgery | Admitting: Neurological Surgery

## 2017-08-30 DIAGNOSIS — M48061 Spinal stenosis, lumbar region without neurogenic claudication: Secondary | ICD-10-CM | POA: Insufficient documentation

## 2017-08-30 DIAGNOSIS — Z9889 Other specified postprocedural states: Secondary | ICD-10-CM

## 2017-08-30 DIAGNOSIS — M5136 Other intervertebral disc degeneration, lumbar region: Secondary | ICD-10-CM | POA: Insufficient documentation

## 2017-08-30 DIAGNOSIS — M5126 Other intervertebral disc displacement, lumbar region: Secondary | ICD-10-CM | POA: Insufficient documentation

## 2017-09-03 ENCOUNTER — Other Ambulatory Visit: Payer: Self-pay | Admitting: Family Medicine

## 2017-09-23 ENCOUNTER — Encounter: Payer: Self-pay | Admitting: *Deleted

## 2017-09-23 ENCOUNTER — Other Ambulatory Visit: Payer: Self-pay

## 2017-09-23 ENCOUNTER — Ambulatory Visit
Admission: EM | Admit: 2017-09-23 | Discharge: 2017-09-23 | Disposition: A | Discharge status: Home / Self Care | Payer: BLUE CROSS/BLUE SHIELD | Attending: Family Medicine | Admitting: Family Medicine

## 2017-09-23 DIAGNOSIS — B029 Zoster without complications: Secondary | ICD-10-CM

## 2017-09-23 MED ORDER — GABAPENTIN 300 MG PO CAPS: 300.0000 mg | ORAL_CAPSULE | Freq: Three times a day (TID) | ORAL | 9 refills | Status: DC | PRN

## 2017-09-23 MED ORDER — VALACYCLOVIR HCL 1 G PO TABS: 1000.0000 mg | ORAL_TABLET | Freq: Two times a day (BID) | ORAL | 0 refills | Status: AC

## 2017-09-23 NOTE — ED Triage Notes (Signed)
Patient started having symptom of shingles 1 week ago. Patient does have a history of shingles.

## 2017-09-23 NOTE — ED Provider Notes (Addendum)
[facility]    CSN: [id number] Arrival date & time: 09/23/17  1418     History   Chief Complaint Chief Complaint  Patient presents with  . Herpes Zoster    HPI Linda Rhodes is a 41 y.o. female presented with CC of painful, vesicular rash to left mid back x 7 days. Rash is itchy and painful.  The history is provided by the patient.    Past Medical History:  Diagnosis Date  . Abscessed tooth 09/24/2016   right lower tooth removed.  . Anemia    in past, prior to removal of uterine fibroids  . Bladder infection   . Difficult intubation   . GERD (gastroesophageal reflux disease)   . Headache    migrains  . Hypertension   . Motion sickness    all moving vehicles  . Ovarian cyst   . PONV (postoperative nausea and vomiting)     Patient Active Problem List   Diagnosis Date Noted  . S/P hysterectomy 05/06/2015    Past Surgical History:  Procedure Laterality Date  . ABDOMINAL HYSTERECTOMY    . BACK SURGERY  2016   [facility] Neurosurgery - ruptured L4-5  . CESAREAN SECTION    . CHOLECYSTECTOMY    . CYSTOSCOPY N/A 05/06/2015   Procedure: CYSTOSCOPY;  Surgeon: Aletha Halim, MD;  Location: [facility];  Service: Gynecology;  Laterality: N/A;  . ETHMOIDECTOMY Right 11/25/2015   Procedure:  ANTERIOR ETHMOIDECTOMY;  Surgeon: Carloyn Manner, MD;  Location: [place];  Service: ENT;  Laterality: Right;  . FRONTAL SINUS EXPLORATION Right 11/25/2015   Procedure: FRONTAL SINUSOTOMY;  Surgeon: Carloyn Manner, MD;  Location: [place];  Service: ENT;  Laterality: Right;  . IMAGE GUIDED SINUS SURGERY N/A 11/25/2015   Procedure: IMAGE GUIDED SINUS SURGERY WITH BALLOON;  Surgeon: Carloyn Manner, MD;  Location: [place];  Service: ENT;  Laterality: N/A;  . LAPAROSCOPIC HYSTERECTOMY N/A 05/06/2015   Procedure: HYSTERECTOMY TOTAL LAPAROSCOPIC;  Surgeon: Aletha Halim, MD;  Location: [facility];  Service: Gynecology;  Laterality: N/A;  . LUMBAR  LAMINECTOMY/DECOMPRESSION MICRODISCECTOMY N/A 10/04/2016   Procedure: right minimally invasive L5 S1 microdiscectomy;  Surgeon: Bayard Hugger, MD;  Location: [facility];  Service: Neurosurgery;  Laterality: N/A;  . MAXILLARY ANTROSTOMY Right 11/25/2015   Procedure: MAXILLARY ANTROSTOMY WITH TISSUE REMOVAL;  Surgeon: Carloyn Manner, MD;  Location: [place];  Service: ENT;  Laterality: Right;  . WISDOM TOOTH EXTRACTION  10/2115   3 teeth removed.    OB History    No data available       Home Medications    Prior to Admission medications   Medication Sig Start Date End Date Taking? Authorizing Provider  acetaminophen (TYLENOL) 500 MG tablet Take 1,000 mg by mouth every 6 (six) hours as needed for mild pain.   Yes [provider]  metoprolol succinate (TOPROL-XL) 100 MG 24 hr tablet Take 25 mg by mouth daily. 08/04/16  Yes [provider]  varenicline (CHANTIX) 1 MG tablet Take 1 mg by mouth 2 (two) times daily.   Yes [provider]  gabapentin (NEURONTIN) 300 MG capsule Take 1 capsule (300 mg total) by mouth 3 (three) times daily. 08/06/17   Coral Spikes, DO  Menthol, Topical Analgesic, (BIOFREEZE EX) Apply 1 application topically 3 (three) times daily as needed (back pain). Alternates between [employer] and [employer], Historical, MD  NICOTINE STEP 1 21 MG/24HR patch PLACE 1 PATCH (21  MG TOTAL) ONTO THE SKIN DAILY. 09/26/15   Juline Patch, MD  valACYclovir (VALTREX) 1000 MG tablet Take 1 tablet (1,000 mg total) by mouth 2 (two) times daily for 10 days. Take medication with food 09/23/17 10/03/17  Teola Bradley, NP    Family History Family History  Problem Relation Age of Onset  . Diabetes Mother   . Hypertension Mother   . Diabetes Father   . Hypertension Father     Social History Social History   Tobacco Use  . Smoking status: Former Smoker    Packs/day: 0.50    Years: 7.00    Pack years: 3.50    Types: Cigarettes    Last  attempt to quit: 10/19/2015    Years since quitting: 1.9  . Smokeless tobacco: Never Used  Substance Use Topics  . Alcohol use: Yes    Comment: socially - 1x/mo  . Drug use: No     Allergies   Aspirin and Adhesive [tape]   Review of Systems Review of Systems  Constitutional: Negative.   HENT: Negative.   Eyes: Negative.   Respiratory: Negative.   Cardiovascular: Negative.   Skin: Positive for rash (Rash to left mid back. Painful and itchy).  Neurological: Negative.   Psychiatric/Behavioral: Negative.      Physical Exam Triage Vital Signs ED Triage Vitals [09/23/17 1453]  Enc Vitals Group     BP (!) 153/87     Pulse Rate 98     Resp 16     Temp 98.2 F (36.8 C)     Temp Source Oral     SpO2 100 %     Weight 135 lb (61.2 kg)     Height 5\' 7"  (1.702 m)     Head Circumference      Peak Flow      Pain Score 5     Pain Loc      Pain Edu?      Excl. in [place]?    No data found.  Updated Vital Signs BP (!) 153/87 (BP Location: Left Arm)   Pulse 98   Temp 98.2 F (36.8 C) (Oral)   Resp 16   Ht 5\' 7"  (1.702 m)   Wt 135 lb (61.2 kg)   LMP  (LMP Unknown)   SpO2 100%   BMI 21.14 kg/m   Visual Acuity Right Eye Distance:   Left Eye Distance:   Bilateral Distance:    Right Eye Near:   Left Eye Near:    Bilateral Near:     Physical Exam  Constitutional: She is oriented to person, place, and time. She appears well-developed and well-nourished.  Eyes: Pupils are equal, round, and reactive to light.  Cardiovascular: Regular rhythm and normal heart sounds.  Pulmonary/Chest: Effort normal and breath sounds normal.  Neurological: She is alert and oriented to person, place, and time.  Skin: Rash (Painful, vesicular rash with few scabbed lesions to left mid back. Tender to touch . Pt reports painful to keep Bra.) noted.  Psychiatric: She has a normal mood and affect.     UC Treatments / Results  Labs (all labs ordered are listed, but only abnormal results are  displayed) Labs Reviewed - No data to display  EKG  EKG Interpretation None       Radiology No results found.  Procedures Procedures (including critical care time)  Medications Ordered in UC Medications - No data to display   Initial Impression / Assessment and Plan / UC Course  I have reviewed the triage vital signs and the nursing notes.  Pertinent labs & imaging results that were available during my care of the patient were reviewed by me and considered in my medical decision making (see chart for details).       Final Clinical Impressions(s) / UC Diagnoses   Final diagnoses:  Herpes zoster without complication    ED Discharge Orders        Ordered    valACYclovir (VALTREX) 1000 MG tablet  2 times daily     09/23/17 1530       Controlled Substance Prescriptions [place] Controlled Substance Registry consulted? Not Applicable   Teola Bradley, NP 09/23/17 1531    Teola Bradley, NP 09/23/17 1534

## 2017-09-23 NOTE — Discharge Instructions (Signed)
Tylenol/Motrin as needed for pain.

## 2017-12-15 DIAGNOSIS — Z9889 Other specified postprocedural states: Secondary | ICD-10-CM | POA: Insufficient documentation

## 2017-12-15 DIAGNOSIS — M5416 Radiculopathy, lumbar region: Secondary | ICD-10-CM | POA: Insufficient documentation

## 2018-01-09 ENCOUNTER — Other Ambulatory Visit: Payer: Self-pay | Admitting: Family Medicine

## 2018-01-12 ENCOUNTER — Other Ambulatory Visit: Payer: Self-pay

## 2018-01-12 MED ORDER — VARENICLINE TARTRATE 1 MG PO TABS: 1.0000 mg | ORAL_TABLET | Freq: Two times a day (BID) | ORAL | 0 refills | Status: DC

## 2018-02-01 ENCOUNTER — Other Ambulatory Visit: Payer: Self-pay | Admitting: Family Medicine

## 2018-03-11 ENCOUNTER — Other Ambulatory Visit: Payer: Self-pay | Admitting: Family Medicine

## 2018-03-25 ENCOUNTER — Emergency Department
Admission: EM | Admit: 2018-03-25 | Discharge: 2018-03-25 | Disposition: A | Discharge status: Home / Self Care | Payer: BLUE CROSS/BLUE SHIELD | Attending: Emergency Medicine | Admitting: Emergency Medicine

## 2018-03-25 ENCOUNTER — Emergency Department: Payer: BLUE CROSS/BLUE SHIELD

## 2018-03-25 ENCOUNTER — Ambulatory Visit
Admission: EM | Admit: 2018-03-25 | Discharge: 2018-03-25 | Disposition: A | Discharge status: Other Acute Inpatient Hospital | Payer: BLUE CROSS/BLUE SHIELD | Source: Home / Self Care | Attending: Family Medicine | Admitting: Family Medicine

## 2018-03-25 ENCOUNTER — Other Ambulatory Visit: Payer: Self-pay

## 2018-03-25 DIAGNOSIS — I10 Essential (primary) hypertension: Secondary | ICD-10-CM | POA: Diagnosis not present

## 2018-03-25 DIAGNOSIS — Z87891 Personal history of nicotine dependence: Secondary | ICD-10-CM | POA: Diagnosis not present

## 2018-03-25 DIAGNOSIS — K226 Gastro-esophageal laceration-hemorrhage syndrome: Secondary | ICD-10-CM | POA: Diagnosis not present

## 2018-03-25 DIAGNOSIS — Z79899 Other long term (current) drug therapy: Secondary | ICD-10-CM | POA: Insufficient documentation

## 2018-03-25 DIAGNOSIS — K920 Hematemesis: Secondary | ICD-10-CM

## 2018-03-25 DIAGNOSIS — R112 Nausea with vomiting, unspecified: Secondary | ICD-10-CM | POA: Diagnosis present

## 2018-03-25 LAB — URINALYSIS, COMPLETE (UACMP) WITH MICROSCOPIC
BACTERIA UA: NONE SEEN
Bilirubin Urine: NEGATIVE
Glucose, UA: NEGATIVE mg/dL
HGB URINE DIPSTICK: NEGATIVE
Ketones, ur: NEGATIVE mg/dL
Leukocytes, UA: NEGATIVE
Nitrite: NEGATIVE
PROTEIN: NEGATIVE mg/dL
Specific Gravity, Urine: 1.013 (ref 1.005–1.030)
pH: 8 (ref 5.0–8.0)

## 2018-03-25 LAB — URINE DRUG SCREEN, QUALITATIVE (ARMC ONLY)
AMPHETAMINES, UR SCREEN: NOT DETECTED
Barbiturates, Ur Screen: NOT DETECTED
Benzodiazepine, Ur Scrn: NOT DETECTED
Cannabinoid 50 Ng, Ur ~~LOC~~: POSITIVE — AB
Cocaine Metabolite,Ur ~~LOC~~: NOT DETECTED
MDMA (ECSTASY) UR SCREEN: NOT DETECTED
METHADONE SCREEN, URINE: NOT DETECTED
Opiate, Ur Screen: NOT DETECTED
Phencyclidine (PCP) Ur S: NOT DETECTED
TRICYCLIC, UR SCREEN: NOT DETECTED

## 2018-03-25 LAB — COMPREHENSIVE METABOLIC PANEL
ALBUMIN: 4.4 g/dL (ref 3.5–5.0)
ALK PHOS: 75 U/L (ref 38–126)
ALK PHOS: 71 U/L (ref 38–126)
ALT: 11 U/L — ABNORMAL LOW (ref 14–54)
ALT: 14 U/L (ref 14–54)
ANION GAP: 12 (ref 5–15)
AST: 19 U/L (ref 15–41)
AST: 26 U/L (ref 15–41)
Albumin: 4.4 g/dL (ref 3.5–5.0)
Anion gap: 11 (ref 5–15)
BILIRUBIN TOTAL: 0.9 mg/dL (ref 0.3–1.2)
BILIRUBIN TOTAL: 0.9 mg/dL (ref 0.3–1.2)
BUN: 9 mg/dL (ref 6–20)
BUN: 9 mg/dL (ref 6–20)
CALCIUM: 8.6 mg/dL — AB (ref 8.9–10.3)
CALCIUM: 8.3 mg/dL — AB (ref 8.9–10.3)
CO2: 27 mmol/L (ref 22–32)
CO2: 25 mmol/L (ref 22–32)
Chloride: 103 mmol/L (ref 101–111)
Chloride: 103 mmol/L (ref 101–111)
Creatinine, Ser: 0.88 mg/dL (ref 0.44–1.00)
Creatinine, Ser: 0.78 mg/dL (ref 0.44–1.00)
GFR calc Af Amer: >60 mL/min (ref >60)
GFR calc non Af Amer: >60 mL/min (ref >60)
GLUCOSE: 113 mg/dL — AB (ref 65–99)
Glucose, Bld: 126 mg/dL — ABNORMAL HIGH (ref 65–99)
POTASSIUM: 3.5 mmol/L (ref 3.5–5.1)
POTASSIUM: 3.5 mmol/L (ref 3.5–5.1)
SODIUM: 139 mmol/L (ref 135–145)
Sodium: 142 mmol/L (ref 135–145)
TOTAL PROTEIN: 8.1 g/dL (ref 6.5–8.1)
Total Protein: 8.1 g/dL (ref 6.5–8.1)

## 2018-03-25 LAB — CBC WITH DIFFERENTIAL/PLATELET
BASOS ABS: 0 10*3/uL (ref 0–0.1)
BASOS PCT: 1 %
Basophils Absolute: 0 10*3/uL (ref 0–0.1)
Basophils Relative: 0 %
EOS ABS: 0 10*3/uL (ref 0–0.7)
Eosinophils Absolute: 0 10*3/uL (ref 0–0.7)
Eosinophils Relative: 1 %
Eosinophils Relative: 0 %
HEMATOCRIT: 42.2 % (ref 35.0–47.0)
HEMATOCRIT: 44.2 % (ref 35.0–47.0)
HEMOGLOBIN: 14.4 g/dL (ref 12.0–16.0)
HEMOGLOBIN: 14.9 g/dL (ref 12.0–16.0)
LYMPHS ABS: 1 10*3/uL (ref 1.0–3.6)
Lymphocytes Relative: 23 %
Lymphocytes Relative: 15 %
Lymphs Abs: 1.2 10*3/uL (ref 1.0–3.6)
MCH: 32 pg (ref 26.0–34.0)
MCH: 32.2 pg (ref 26.0–34.0)
MCHC: 34.3 g/dL (ref 32.0–36.0)
MCHC: 33.8 g/dL (ref 32.0–36.0)
MCV: 93.5 fL (ref 80.0–100.0)
MCV: 95.4 fL (ref 80.0–100.0)
MONO ABS: 0.3 10*3/uL (ref 0.2–0.9)
MONOS PCT: 4 %
Monocytes Absolute: 0.3 10*3/uL (ref 0.2–0.9)
Monocytes Relative: 6 %
NEUTROS ABS: 3.6 10*3/uL (ref 1.4–6.5)
NEUTROS ABS: 5.6 10*3/uL (ref 1.4–6.5)
NEUTROS PCT: 69 %
NEUTROS PCT: 81 %
Platelets: 212 10*3/uL (ref 150–440)
Platelets: 237 10*3/uL (ref 150–440)
RBC: 4.51 MIL/uL (ref 3.80–5.20)
RBC: 4.64 MIL/uL (ref 3.80–5.20)
RDW: 13.7 % (ref 11.5–14.5)
RDW: 14.1 % (ref 11.5–14.5)
WBC: 5.3 10*3/uL (ref 3.6–11.0)
WBC: 7 10*3/uL (ref 3.6–11.0)

## 2018-03-25 LAB — MAGNESIUM: MAGNESIUM: 2 mg/dL (ref 1.7–2.4)

## 2018-03-25 LAB — BETA-HYDROXYBUTYRIC ACID: BETA-HYDROXYBUTYRIC ACID: 0.08 mmol/L (ref 0.05–0.27)

## 2018-03-25 LAB — ETHANOL: Alcohol, Ethyl (B): 28 mg/dL — ABNORMAL HIGH (ref <10)

## 2018-03-25 MED ORDER — HALOPERIDOL LACTATE 5 MG/ML IJ SOLN
2.5000 mg | Freq: Once | INTRAMUSCULAR | Status: AC
  Administered 2018-03-25: 2.5 mg via INTRAVENOUS
  Filled 2018-03-25: qty 1

## 2018-03-25 MED ORDER — FAMOTIDINE 20 MG PO TABS: 20.0000 mg | ORAL_TABLET | Freq: Two times a day (BID) | ORAL | 0 refills | Status: DC

## 2018-03-25 MED ORDER — PROMETHAZINE HCL 25 MG/ML IJ SOLN
12.5000 mg | Freq: Once | INTRAMUSCULAR | Status: AC
  Administered 2018-03-25: 12.5 mg via INTRAVENOUS

## 2018-03-25 MED ORDER — FAMOTIDINE IN NACL 20-0.9 MG/50ML-% IV SOLN
20.0000 mg | Freq: Once | INTRAVENOUS | Status: AC
  Administered 2018-03-25: 20 mg via INTRAVENOUS
  Filled 2018-03-25: qty 50

## 2018-03-25 MED ORDER — SODIUM CHLORIDE 0.9 % IV BOLUS
1000.0000 mL | Freq: Once | INTRAVENOUS | Status: AC
  Administered 2018-03-25: 1000 mL via INTRAVENOUS

## 2018-03-25 MED ORDER — ONDANSETRON 8 MG PO TBDP: 8.0000 mg | ORAL_TABLET | Freq: Once | ORAL | Status: DC

## 2018-03-25 MED ORDER — PROMETHAZINE HCL 25 MG/ML IJ SOLN
25.0000 mg | Freq: Four times a day (QID) | INTRAMUSCULAR | Status: DC | PRN
  Administered 2018-03-25: 25 mg via INTRAMUSCULAR

## 2018-03-25 MED ORDER — SODIUM CHLORIDE 0.9 % IV SOLN
Freq: Once | INTRAVENOUS | Status: AC
Start: 2018-03-25 — End: 2018-03-25
  Administered 2018-03-25: 14:00:00 via INTRAVENOUS

## 2018-03-25 NOTE — ED Provider Notes (Signed)
[facility] Emergency Department Provider Note  ____________________________________________   First MD Initiated Contact with Patient 03/25/18 1417     (approximate)  I have reviewed the triage vital signs and the nursing notes.   HISTORY  Chief Complaint Abdominal Pain; Emesis; and GI Bleeding    HPI Linda Rhodes is a 42 y.o. female who comes to the emergency department via EMS with retching and vomiting that began this morning at 3 AM.  She has had persistent nausea and vomiting and has been unable to keep food down.  She has moderate to severe upper abdominal pain worse on vomiting and improved when not.  She does have a history of hysterectomy several years ago.  She drinks alcohol daily.  She was initially seen at med in urgent care today because she took 2 doses of Zofran at home with minimal relief of her symptoms.  She was given a total of 37.5 mg of Phenergan prior to arrival here with minimal relief.  She became concerned today because the last several episodes of emesis had dark red blood in them.  She denies nonsteroidal use.  She denies history of optic ulcer disease.  Her symptoms came on suddenly and have been constant.  Nothing seems to make them better or worse.  Past Medical History:  Diagnosis Date  . Abscessed tooth 09/24/2016   right lower tooth removed.  . Anemia    in past, prior to removal of uterine fibroids  . Bladder infection   . Difficult intubation   . GERD (gastroesophageal reflux disease)   . Headache    migrains  . Hypertension   . Motion sickness    all moving vehicles  . Ovarian cyst   . PONV (postoperative nausea and vomiting)     Patient Active Problem List   Diagnosis Date Noted  . S/P hysterectomy 05/06/2015    Past Surgical History:  Procedure Laterality Date  . ABDOMINAL HYSTERECTOMY    . BACK SURGERY  2016   [facility] Neurosurgery - ruptured L4-5  . CESAREAN SECTION    . CHOLECYSTECTOMY    .  CYSTOSCOPY N/A 05/06/2015   Procedure: CYSTOSCOPY;  Surgeon: Aletha Halim, MD;  Location: [facility] ORS;  Service: Gynecology;  Laterality: N/A;  . ETHMOIDECTOMY Right 11/25/2015   Procedure:  ANTERIOR ETHMOIDECTOMY;  Surgeon: Carloyn Manner, MD;  Location: [place];  Service: ENT;  Laterality: Right;  . FRONTAL SINUS EXPLORATION Right 11/25/2015   Procedure: FRONTAL SINUSOTOMY;  Surgeon: Carloyn Manner, MD;  Location: [place];  Service: ENT;  Laterality: Right;  . IMAGE GUIDED SINUS SURGERY N/A 11/25/2015   Procedure: IMAGE GUIDED SINUS SURGERY WITH BALLOON;  Surgeon: Carloyn Manner, MD;  Location: [place];  Service: ENT;  Laterality: N/A;  . LAPAROSCOPIC HYSTERECTOMY N/A 05/06/2015   Procedure: HYSTERECTOMY TOTAL LAPAROSCOPIC;  Surgeon: Aletha Halim, MD;  Location: [facility] ORS;  Service: Gynecology;  Laterality: N/A;  . LUMBAR LAMINECTOMY/DECOMPRESSION MICRODISCECTOMY N/A 10/04/2016   Procedure: right minimally invasive L5 S1 microdiscectomy;  Surgeon: Bayard Hugger, MD;  Location: [facility] ORS;  Service: Neurosurgery;  Laterality: N/A;  . MAXILLARY ANTROSTOMY Right 11/25/2015   Procedure: MAXILLARY ANTROSTOMY WITH TISSUE REMOVAL;  Surgeon: Carloyn Manner, MD;  Location: [place];  Service: ENT;  Laterality: Right;  . WISDOM TOOTH EXTRACTION  10/2115   3 teeth removed.    Prior to Admission medications   Medication Sig Start Date End Date Taking? Authorizing Provider  acetaminophen (TYLENOL) 500 MG  tablet Take 1,000 mg by mouth every 6 (six) hours as needed for mild pain.    [provider]  cyclobenzaprine (FLEXERIL) 5 MG tablet Take 5 mg by mouth 3 (three) times daily as needed for muscle spasms.    [provider]  gabapentin (NEURONTIN) 300 MG capsule Take 1 capsule (300 mg total) by mouth 3 (three) times daily as needed for up to 7 days. 09/23/17 09/30/17  Multani, Bhupinder, NP  Menthol, Topical Analgesic, (BIOFREEZE EX) Apply 1  application topically 3 (three) times daily as needed (back pain). Alternates between [employer] and [employer], Historical, MD  metoprolol succinate (TOPROL-XL) 100 MG 24 hr tablet Take 25 mg by mouth daily. 08/04/16   [provider]  NICOTINE STEP 1 21 MG/24HR patch PLACE 1 PATCH (21 MG TOTAL) ONTO THE SKIN DAILY. 09/26/15   Juline Patch, MD  varenicline (CHANTIX) 1 MG tablet Take 1 tablet (1 mg total) by mouth 2 (two) times daily. 01/12/18   Juline Patch, MD    Allergies Aspirin and Adhesive [tape]  Family History  Problem Relation Age of Onset  . Diabetes Mother   . Hypertension Mother   . Diabetes Father   . Hypertension Father     Social History Social History   Tobacco Use  . Smoking status: Former Smoker    Packs/day: 0.50    Years: 7.00    Pack years: 3.50    Types: Cigarettes    Last attempt to quit: 10/19/2015    Years since quitting: 2.4  . Smokeless tobacco: Never Used  Substance Use Topics  . Alcohol use: Yes    Comment: socially - 1x/mo  . Drug use: No    Review of Systems Constitutional: No fever/chills Eyes: No visual changes. ENT: No sore throat. Cardiovascular: Denies chest pain. Respiratory: Denies shortness of breath. Gastrointestinal: Positive for abdominal pain.  Positive for nausea, positive for vomiting.  No diarrhea.  No constipation. Genitourinary: Negative for dysuria. Musculoskeletal: Negative for back pain. Skin: Negative for rash. Neurological: Negative for headaches, focal weakness or numbness.   ____________________________________________   PHYSICAL EXAM:  VITAL SIGNS: ED Triage Vitals  Enc Vitals Group     BP      Pulse      Resp      Temp      Temp src      SpO2      Weight      Height      Head Circumference      Peak Flow      Pain Score      Pain Loc      Pain Edu?      Excl. in [place]?     Constitutional: Alert and oriented x4 appears uncomfortable sitting up in bed retching holding a  vomit bag Eyes: PERRL EOMI. Head: Atraumatic. Nose: No congestion/rhinnorhea. Mouth/Throat: No trismus Neck: No stridor.   Cardiovascular: Normal rate, regular rhythm. Grossly normal heart sounds.  Good peripheral circulation. Respiratory: Normal respiratory effort.  No retractions. Lungs CTAB and moving good air Gastrointestinal: Soft mild diffuse tenderness with no rebound or guarding no peritonitis Musculoskeletal: No lower extremity edema   Neurologic:  Normal speech and language. No gross focal neurologic deficits are appreciated. Skin:  Skin is warm, dry and intact. No rash noted. Psychiatric: Mood and affect are normal. Speech and behavior are normal.    ____________________________________________   DIFFERENTIAL includes but not limited to  Mallory-Weiss  tear, Boerhaave syndrome, pancreatitis, gastritis, dehydration ____________________________________________   LABS (all labs ordered are listed, but only abnormal results are displayed)  Labs Reviewed  COMPREHENSIVE METABOLIC PANEL  ETHANOL  CBC WITH DIFFERENTIAL/PLATELET  URINALYSIS, COMPLETE (UACMP) WITH MICROSCOPIC  URINE DRUG SCREEN, QUALITATIVE ([facility] ONLY)  BETA-HYDROXYBUTYRIC ACID    Lab work pending at this time __________________________________________  EKG   ____________________________________________  RADIOLOGY   ____________________________________________   PROCEDURES  Procedure(s) performed: no  Procedures  Critical Care performed: no  Observation: no ____________________________________________   INITIAL IMPRESSION / ASSESSMENT AND PLAN / ED COURSE  Pertinent labs & imaging results that were available during my care of the patient were reviewed by me and considered in my medical decision making (see chart for details).  The patient arrives retching and obviously uncomfortable appearing.  Her history is consistent with a Mallory-Weiss tear however given the severity of her  symptoms and concerned about possible Boerhaave syndrome.  Haldol for anti-emetic purposes is pending along with a chest x-ray and broad labs.  Care will be signed over to the oncoming physician who will determine ultimate disposition however I do anticipate discharge home.      ____________________________________________   FINAL CLINICAL IMPRESSION(S) / ED DIAGNOSES  Final diagnoses:  Mallory-Weiss tear      NEW MEDICATIONS STARTED DURING THIS VISIT:  New Prescriptions   No medications on file     Note:  This document was prepared using Dragon voice recognition software and may include unintentional dictation errors.     Darel Hong, MD 03/25/18 [phone]

## 2018-03-25 NOTE — Discharge Instructions (Addendum)
Avoid alcohol for the next 2 weeks as this may cause your symptoms to happen again.  Take pepcid every day to help protect your stomach and esophagus to promote healing.

## 2018-03-25 NOTE — ED Notes (Signed)
Patient taken to xray.

## 2018-03-25 NOTE — ED Notes (Signed)
Patient is sleeping at this time.

## 2018-03-25 NOTE — ED Provider Notes (Addendum)
[facility]    CSN: [id number] Arrival date & time: 03/25/18  1227     History   Chief Complaint Chief Complaint  Patient presents with  . Emesis    HPI Linda Rhodes is a 42 y.o. female.   42 yo female with a c/o nausea and vomiting since about 3 am not improved with zofran x 2 at home. States she has vomited over 10 times. Denies any diarrhea, abdominal pain, fevers, chills. States she ate out at a [employer] and thinks this made her sick.   Patient given phenergan 25mg  IM x 1 here and IV started. Patient vomited 4 more times while here in Urgent care with last two episodes being bright red blood. After further inquiry, friend in room stated that patient drinks "a couple of beers everyday".  Denies known h/o peptic ulcer, however has h/o GERD.    Emesis    Past Medical History:  Diagnosis Date  . Abscessed tooth 09/24/2016   right lower tooth removed.  . Anemia    in past, prior to removal of uterine fibroids  . Bladder infection   . Difficult intubation   . GERD (gastroesophageal reflux disease)   . Headache    migrains  . Hypertension   . Motion sickness    all moving vehicles  . Ovarian cyst   . PONV (postoperative nausea and vomiting)     Patient Active Problem List   Diagnosis Date Noted  . S/P hysterectomy 05/06/2015    Past Surgical History:  Procedure Laterality Date  . ABDOMINAL HYSTERECTOMY    . BACK SURGERY  2016   [facility] Neurosurgery - ruptured L4-5  . CESAREAN SECTION    . CHOLECYSTECTOMY    . CYSTOSCOPY N/A 05/06/2015   Procedure: CYSTOSCOPY;  Surgeon: Aletha Halim, MD;  Location: [facility];  Service: Gynecology;  Laterality: N/A;  . ETHMOIDECTOMY Right 11/25/2015   Procedure:  ANTERIOR ETHMOIDECTOMY;  Surgeon: Carloyn Manner, MD;  Location: [place];  Service: ENT;  Laterality: Right;  . FRONTAL SINUS EXPLORATION Right 11/25/2015   Procedure: FRONTAL SINUSOTOMY;  Surgeon: Carloyn Manner, MD;   Location: [place];  Service: ENT;  Laterality: Right;  . IMAGE GUIDED SINUS SURGERY N/A 11/25/2015   Procedure: IMAGE GUIDED SINUS SURGERY WITH BALLOON;  Surgeon: Carloyn Manner, MD;  Location: [place];  Service: ENT;  Laterality: N/A;  . LAPAROSCOPIC HYSTERECTOMY N/A 05/06/2015   Procedure: HYSTERECTOMY TOTAL LAPAROSCOPIC;  Surgeon: Aletha Halim, MD;  Location: [facility];  Service: Gynecology;  Laterality: N/A;  . LUMBAR LAMINECTOMY/DECOMPRESSION MICRODISCECTOMY N/A 10/04/2016   Procedure: right minimally invasive L5 S1 microdiscectomy;  Surgeon: Bayard Hugger, MD;  Location: [facility];  Service: Neurosurgery;  Laterality: N/A;  . MAXILLARY ANTROSTOMY Right 11/25/2015   Procedure: MAXILLARY ANTROSTOMY WITH TISSUE REMOVAL;  Surgeon: Carloyn Manner, MD;  Location: [place];  Service: ENT;  Laterality: Right;  . WISDOM TOOTH EXTRACTION  10/2115   3 teeth removed.    OB History   None      Home Medications    Prior to Admission medications   Medication Sig Start Date End Date Taking? Authorizing Provider  cyclobenzaprine (FLEXERIL) 5 MG tablet Take 5 mg by mouth 3 (three) times daily as needed for muscle spasms.   Yes [provider]  acetaminophen (TYLENOL) 500 MG tablet Take 1,000 mg by mouth every 6 (six) hours as needed for mild pain.    [provider]  gabapentin (  NEURONTIN) 300 MG capsule Take 1 capsule (300 mg total) by mouth 3 (three) times daily as needed for up to 7 days. 09/23/17 09/30/17  Multani, Bhupinder, NP  Menthol, Topical Analgesic, (BIOFREEZE EX) Apply 1 application topically 3 (three) times daily as needed (back pain). Alternates between [employer] and [employer], Historical, MD  metoprolol succinate (TOPROL-XL) 100 MG 24 hr tablet Take 25 mg by mouth daily. 08/04/16   [provider]  NICOTINE STEP 1 21 MG/24HR patch PLACE 1 PATCH (21 MG TOTAL) ONTO THE SKIN DAILY. 09/26/15   Juline Patch, MD    varenicline (CHANTIX) 1 MG tablet Take 1 tablet (1 mg total) by mouth 2 (two) times daily. 01/12/18   Juline Patch, MD    Family History Family History  Problem Relation Age of Onset  . Diabetes Mother   . Hypertension Mother   . Diabetes Father   . Hypertension Father     Social History Social History   Tobacco Use  . Smoking status: Former Smoker    Packs/day: 0.50    Years: 7.00    Pack years: 3.50    Types: Cigarettes    Last attempt to quit: 10/19/2015    Years since quitting: 2.4  . Smokeless tobacco: Never Used  Substance Use Topics  . Alcohol use: Yes    Comment: socially - 1x/mo  . Drug use: No     Allergies   Aspirin and Adhesive [tape]   Review of Systems Review of Systems  Gastrointestinal: Positive for vomiting.     Physical Exam Triage Vital Signs ED Triage Vitals  Enc Vitals Group     BP 03/25/18 1236 (!) 156/106     Pulse Rate 03/25/18 1236 90     Resp 03/25/18 1236 20     Temp 03/25/18 1236 97.6 F (36.4 C)     Temp Source 03/25/18 1236 Oral     SpO2 03/25/18 1236 100 %     Weight 03/25/18 1236 135 lb (61.2 kg)     Height 03/25/18 1236 5\' 7"  (1.702 m)     Head Circumference --      Peak Flow --      Pain Score 03/25/18 1235 0     Pain Loc --      Pain Edu? --      Excl. in [place]? --    No data found.  Updated Vital Signs BP (!) 156/106 (BP Location: Left Arm)   Pulse 90   Temp 97.6 F (36.4 C) (Oral)   Resp 20   Ht 5\' 7"  (1.702 m)   Wt 135 lb (61.2 kg)   LMP  (LMP Unknown)   SpO2 100%   BMI 21.14 kg/m   Visual Acuity Right Eye Distance:   Left Eye Distance:   Bilateral Distance:    Right Eye Near:   Left Eye Near:    Bilateral Near:     Physical Exam  Constitutional: She appears well-developed and well-nourished. No distress.  Cardiovascular: Normal rate and regular rhythm.  No murmur heard. Pulmonary/Chest: Effort normal and breath sounds normal. No stridor. No respiratory distress. She has no wheezes. She has  no rales.  Abdominal: Soft. Bowel sounds are normal. She exhibits no distension and no mass. There is no tenderness. There is no rebound and no guarding. No hernia.  Skin: She is not diaphoretic.  Nursing note and vitals reviewed.    UC Treatments / Results  Labs (all  labs ordered are listed, but only abnormal results are displayed) Labs Reviewed  COMPREHENSIVE METABOLIC PANEL - Abnormal; Notable for the following components:      Result Value   Glucose, Bld 126 (*)    Calcium 8.6 (*)    ALT 11 (*)    All other components within normal limits  MAGNESIUM  CBC WITH DIFFERENTIAL/PLATELET    EKG None  Radiology No results found.  Procedures Procedures (including critical care time)  Medications Ordered in UC Medications  promethazine (PHENERGAN) injection 25 mg (25 mg Intramuscular Given 03/25/18 1251)  0.9 %  sodium chloride infusion ( Intravenous Transfusing/Transfer 03/25/18 1357)  promethazine (PHENERGAN) injection 12.5 mg (12.5 mg Intravenous Given 03/25/18 1347)    Initial Impression / Assessment and Plan / UC Course  I have reviewed the triage vital signs and the nursing notes.  Pertinent labs & imaging results that were available during my care of the patient were reviewed by me and considered in my medical decision making (see chart for details).      Final Clinical Impressions(s) / UC Diagnoses   Final diagnoses:  Hematemesis with nausea     Discharge Instructions     To ED for further evaluation and management    ED Prescriptions    None     1. Lab results and diagnosis reviewed with patient; total of 37.5mg  phenergan given; IVF; recommend patient go to ED by EMS for further evaluation and management. Patient in stable condition. Report called to charge RN at [facility] ED.   Controlled Substance Prescriptions [place] Controlled Substance Registry consulted? Not Applicable   Norval Gable, MD 03/25/18 [place], MD 03/25/18 [phone]

## 2018-03-25 NOTE — ED Triage Notes (Signed)
Pt ate [place] food last pm for supper. At 0300 she started vomiting. Denies abd pain or diarrhea

## 2018-03-25 NOTE — ED Provider Notes (Signed)
 -----------------------------------------   4:45 PM on 03/25/2018 -----------------------------------------  Labs are normal, vital signs are normal.  No further vomiting in the ED after antiemetics.  Patient is receiving IV fluids for hydration.  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  Doubt bleeding ulcer AVM or other significant hemorrhagic source.  This appears to be self-limited and the patient is stable in good condition for discharge.  I will have her follow-up with gastroenterology.   Carrie Mew, MD 03/25/18 [phone]

## 2018-03-25 NOTE — ED Notes (Signed)
NS infusing upon arrival.

## 2018-03-25 NOTE — ED Triage Notes (Signed)
Pt arrives via ems from [facility] urgent care, pt presented to urgent care after eating [place] food for dinner last night that caused vomiting, approx 20 times and took a zofran that she had at home, pt was given 37.5 mg phenergan total at urgent care and reports that she has vomited on the way with ems, pt had 2 episodes of bloody emesis for [facility].

## 2018-03-25 NOTE — Discharge Instructions (Signed)
To ED for further evaluation and management

## 2018-03-27 ENCOUNTER — Telehealth (HOSPITAL_COMMUNITY): Payer: Self-pay

## 2018-03-27 NOTE — Telephone Encounter (Signed)
Results are within normal range. Pt contacted and made aware. Verbalized understanding.   

## 2018-04-07 ENCOUNTER — Encounter: Payer: Self-pay | Admitting: Gastroenterology

## 2018-04-07 ENCOUNTER — Ambulatory Visit: Payer: BLUE CROSS/BLUE SHIELD | Admitting: Gastroenterology

## 2018-04-07 ENCOUNTER — Other Ambulatory Visit: Payer: Self-pay

## 2018-04-07 VITALS — BP 151/105 | HR 96 | Resp 17 | Wt 143.2 lb

## 2018-04-07 DIAGNOSIS — R112 Nausea with vomiting, unspecified: Secondary | ICD-10-CM

## 2018-04-07 DIAGNOSIS — R1013 Epigastric pain: Secondary | ICD-10-CM | POA: Diagnosis not present

## 2018-04-07 DIAGNOSIS — G8929 Other chronic pain: Secondary | ICD-10-CM

## 2018-04-07 NOTE — Progress Notes (Signed)
Cephas Darby, MD [address]  [place]  [place], [place] [address]  Main: [phone]  Fax: [phone]    Gastroenterology Consultation  Referring Provider:     Juline Patch, MD Primary Care Physician:  Juline Patch, MD Primary Gastroenterologist:  Dr. Cephas Darby Reason for Consultation:     Hematemesis, dark stools, epigastric pain        HPI:   FLORNCE RECORD is a 42 y.o. African-American female with chronic alcohol use referred by Dr. Juline Patch, MD  for consultation & management of chronic upper abdominal pain associated with intermittent episodes of nausea and vomiting followed by hematemesis and dark stools. Otherwise, her bowel movements are generally brown. Since age of 51, she has been drinking alcohol 2 beers daily and sometimes more. She lives with her female partner. She was recently in the year because of nausea vomiting and abdominal pain, hemoglobin was normal, LFTs were normal. She smokes marijuana. She is concerned that she is not able to gain weight but hasn't lost weight recently.  NSAIDs: none  Antiplts/Anticoagulants/Anti thrombotics: none  GI Procedures: none Grandmother with colon cancer in her 3s  Past Medical History:  Diagnosis Date  . Abscessed tooth 09/24/2016   right lower tooth removed.  . Anemia    in past, prior to removal of uterine fibroids  . Bladder infection   . Difficult intubation   . GERD (gastroesophageal reflux disease)   . Headache    migrains  . Hypertension   . Motion sickness    all moving vehicles  . Ovarian cyst   . PONV (postoperative nausea and vomiting)     Past Surgical History:  Procedure Laterality Date  . ABDOMINAL HYSTERECTOMY    . BACK SURGERY  2016   [facility] Neurosurgery - ruptured L4-5  . CESAREAN SECTION    . CHOLECYSTECTOMY    . CYSTOSCOPY N/A 05/06/2015   Procedure: CYSTOSCOPY;  Surgeon: Aletha Halim, MD;  Location: [facility];  Service: Gynecology;  Laterality: N/A;  .  ETHMOIDECTOMY Right 11/25/2015   Procedure:  ANTERIOR ETHMOIDECTOMY;  Surgeon: Carloyn Manner, MD;  Location: [place];  Service: ENT;  Laterality: Right;  . FRONTAL SINUS EXPLORATION Right 11/25/2015   Procedure: FRONTAL SINUSOTOMY;  Surgeon: Carloyn Manner, MD;  Location: [place];  Service: ENT;  Laterality: Right;  . IMAGE GUIDED SINUS SURGERY N/A 11/25/2015   Procedure: IMAGE GUIDED SINUS SURGERY WITH BALLOON;  Surgeon: Carloyn Manner, MD;  Location: [place];  Service: ENT;  Laterality: N/A;  . LAPAROSCOPIC HYSTERECTOMY N/A 05/06/2015   Procedure: HYSTERECTOMY TOTAL LAPAROSCOPIC;  Surgeon: Aletha Halim, MD;  Location: [facility];  Service: Gynecology;  Laterality: N/A;  . LUMBAR LAMINECTOMY/DECOMPRESSION MICRODISCECTOMY N/A 10/04/2016   Procedure: right minimally invasive L5 S1 microdiscectomy;  Surgeon: Bayard Hugger, MD;  Location: [facility];  Service: Neurosurgery;  Laterality: N/A;  . MAXILLARY ANTROSTOMY Right 11/25/2015   Procedure: MAXILLARY ANTROSTOMY WITH TISSUE REMOVAL;  Surgeon: Carloyn Manner, MD;  Location: [place];  Service: ENT;  Laterality: Right;  . WISDOM TOOTH EXTRACTION  10/2115   3 teeth removed.    Current Outpatient Medications:  .  acetaminophen (TYLENOL) 500 MG tablet, Take 1,000 mg by mouth every 6 (six) hours as needed for mild pain., Disp: , Rfl:  .  cyclobenzaprine (FLEXERIL) 5 MG tablet, Take 5 mg by mouth 3 (three) times daily as needed for muscle spasms., Disp: , Rfl:  .  famotidine (  PEPCID) 20 MG tablet, Take 1 tablet (20 mg total) by mouth 2 (two) times daily., Disp: 60 tablet, Rfl: 0 .  gabapentin (NEURONTIN) 400 MG capsule, Take by mouth., Disp: , Rfl:  .  metoprolol succinate (TOPROL-XL) 25 MG 24 hr tablet, Take by mouth., Disp: , Rfl:  .  cyclobenzaprine (FLEXERIL) 10 MG tablet, TAKE 1 TABLET BY MOUTH THREE TIMES A DAY AS NEEDED FOR MUSCLE SPASMS, Disp: , Rfl: 2 .  gabapentin (NEURONTIN) 300 MG capsule, Take 1  capsule (300 mg total) by mouth 3 (three) times daily as needed for up to 7 days., Disp: 21 capsule, Rfl: 9 .  Menthol, Topical Analgesic, (BIOFREEZE EX), Apply 1 application topically 3 (three) times daily as needed (back pain). Alternates between [employer] and [employer], Disp: , Rfl:  .  metoprolol succinate (TOPROL-XL) 100 MG 24 hr tablet, Take 25 mg by mouth daily., Disp: , Rfl: 0 .  NICOTINE STEP 1 21 MG/24HR patch, PLACE 1 PATCH (21 MG TOTAL) ONTO THE SKIN DAILY. (Patient not taking: Reported on 04/07/2018), Disp: 28 patch, Rfl: 0 .  oxyCODONE (OXY IR/ROXICODONE) 5 MG immediate release tablet, TAKE 1 TABLET (5 MG TOTAL) BY MOUTH EVERY 6 (SIX) HOURS AS NEEDED FOR PAIN, Disp: , Rfl: 0 .  potassium chloride (K-DUR,KLOR-CON) 10 MEQ tablet, Take by mouth., Disp: , Rfl:  .  varenicline (CHANTIX) 1 MG tablet, Take 1 tablet (1 mg total) by mouth 2 (two) times daily. (Patient not taking: Reported on 04/07/2018), Disp: 60 tablet, Rfl: 0    Family History  Problem Relation Age of Onset  . Diabetes Mother   . Hypertension Mother   . Diabetes Father   . Hypertension Father      Social History   Tobacco Use  . Smoking status: Former Smoker    Packs/day: 0.50    Years: 7.00    Pack years: 3.50    Types: Cigarettes    Last attempt to quit: 10/19/2015    Years since quitting: 2.4  . Smokeless tobacco: Never Used  Substance Use Topics  . Alcohol use: Yes    Comment: socially - 1x/mo  . Drug use: No    Allergies as of 04/07/2018 - Review Complete 04/07/2018  Allergen Reaction Noted  . Aspirin Other (See Comments) 08/04/2016  . Adhesive [tape] Rash 11/20/2015    Review of Systems:    All systems reviewed and negative except where noted in HPI.   Physical Exam:  BP (!) 151/105 (BP Location: Left Arm, Patient Position: Sitting, Cuff Size: Normal)   Pulse 96   Resp 17   Wt 143 lb 3.2 oz (65 kg)   LMP  (LMP Unknown)   BMI 22.43 kg/m  No LMP recorded (lmp unknown). Patient has had a  hysterectomy.  General:   Alert,  Thin built, moderately nourished and cooperative in NAD Head:  Normocephalic and atraumatic. Eyes:  Sclera clear, no icterus.   Conjunctiva pink. Ears:  Normal auditory acuity. Nose:  No deformity, discharge, or lesions. Mouth:  No deformity or lesions,oropharynx pink & moist. Neck:  Supple; no masses or thyromegaly. Lungs:  Respirations even and unlabored.  Clear throughout to auscultation.   No wheezes, crackles, or rhonchi. No acute distress. Heart:  Regular rate and rhythm; no murmurs, clicks, rubs, or gallops. Abdomen:  Normal bowel sounds. Soft, non-tender and non-distended without masses, hepatosplenomegaly or hernias noted.  No guarding or rebound tenderness.   Rectal: Not performed Msk:  Symmetrical without gross deformities. Good,  equal movement & strength bilaterally. Pulses:  Normal pulses noted. Extremities:  No clubbing or edema.  No cyanosis. Neurologic:  Alert and oriented x3;  grossly normal neurologically. Skin:  Intact without significant lesions or rashes. No jaundice. Lymph Nodes:  No significant cervical adenopathy. Psych:  Alert and cooperative. Normal mood and affect.  Imaging Studies: No recent abdominal imaging  Assessment and Plan:   AIYANNA AWTREY is a 42 y.o. African-American female with chronic alcohol use, seen in consultation for chronic upper abdominal pain, intermittent episodes of nausea, vomiting and dark stools. Her hemoglobin is stable  - Schedule EGD for further evaluation - Abstinence from alcohol   Follow up based on the EGD results   Cephas Darby, MD

## 2018-04-13 ENCOUNTER — Encounter: Payer: Self-pay | Admitting: *Deleted

## 2018-04-13 ENCOUNTER — Ambulatory Visit
Admission: RE | Admit: 2018-04-13 | Discharge: 2018-04-13 | Disposition: A | Discharge status: Home / Self Care | Payer: BLUE CROSS/BLUE SHIELD | Source: Ambulatory Visit | Attending: Gastroenterology | Admitting: Gastroenterology

## 2018-04-13 ENCOUNTER — Encounter
Admission: RE | Disposition: A | Discharge status: Home / Self Care | Payer: Self-pay | Source: Ambulatory Visit | Attending: Gastroenterology

## 2018-04-13 ENCOUNTER — Ambulatory Visit: Payer: BLUE CROSS/BLUE SHIELD | Admitting: Anesthesiology

## 2018-04-13 DIAGNOSIS — Z87891 Personal history of nicotine dependence: Secondary | ICD-10-CM | POA: Diagnosis not present

## 2018-04-13 DIAGNOSIS — Z79899 Other long term (current) drug therapy: Secondary | ICD-10-CM | POA: Insufficient documentation

## 2018-04-13 DIAGNOSIS — I10 Essential (primary) hypertension: Secondary | ICD-10-CM | POA: Insufficient documentation

## 2018-04-13 DIAGNOSIS — K920 Hematemesis: Secondary | ICD-10-CM | POA: Insufficient documentation

## 2018-04-13 DIAGNOSIS — R1013 Epigastric pain: Secondary | ICD-10-CM

## 2018-04-13 DIAGNOSIS — K219 Gastro-esophageal reflux disease without esophagitis: Secondary | ICD-10-CM | POA: Diagnosis not present

## 2018-04-13 DIAGNOSIS — K3189 Other diseases of stomach and duodenum: Secondary | ICD-10-CM | POA: Diagnosis not present

## 2018-04-13 DIAGNOSIS — G8929 Other chronic pain: Secondary | ICD-10-CM

## 2018-04-13 DIAGNOSIS — K319 Disease of stomach and duodenum, unspecified: Secondary | ICD-10-CM | POA: Insufficient documentation

## 2018-04-13 HISTORY — PX: ESOPHAGOGASTRODUODENOSCOPY (EGD) WITH PROPOFOL: SHX5813

## 2018-04-13 SURGERY — ESOPHAGOGASTRODUODENOSCOPY (EGD) WITH PROPOFOL
Anesthesia: General

## 2018-04-13 MED ORDER — PROPOFOL 500 MG/50ML IV EMUL
INTRAVENOUS | Status: AC
  Filled 2018-04-13: qty 50

## 2018-04-13 MED ORDER — SODIUM CHLORIDE 0.9 % IV SOLN
INTRAVENOUS | Status: DC
  Administered 2018-04-13: 08:00:00 via INTRAVENOUS

## 2018-04-13 MED ORDER — PROPOFOL 500 MG/50ML IV EMUL
INTRAVENOUS | Status: DC | PRN
  Administered 2018-04-13: 125 ug/kg/min via INTRAVENOUS

## 2018-04-13 MED ORDER — PROPOFOL 10 MG/ML IV BOLUS
INTRAVENOUS | Status: DC | PRN
  Administered 2018-04-13 (×2): 50 mg via INTRAVENOUS

## 2018-04-13 NOTE — Anesthesia Post-op Follow-up Note (Signed)
Anesthesia QCDR form completed.        

## 2018-04-13 NOTE — Anesthesia Postprocedure Evaluation (Signed)
Anesthesia Post Note  Patient: Linda Rhodes  Procedure(s) Performed: ESOPHAGOGASTRODUODENOSCOPY (EGD) WITH PROPOFOL (N/A )  Patient location during evaluation: Endoscopy Anesthesia Type: General Level of consciousness: awake and alert Pain management: pain level controlled Vital Signs Assessment: post-procedure vital signs reviewed and stable Respiratory status: spontaneous breathing, nonlabored ventilation, respiratory function stable and patient connected to nasal cannula oxygen Cardiovascular status: blood pressure returned to baseline and stable Postop Assessment: no apparent nausea or vomiting Anesthetic complications: no     Last Vitals:  Vitals:   04/13/18 0842 04/13/18 0852  BP: (!) 152/103 (!) 149/99  Pulse: 97 80  Resp: 15 (!) 21  Temp:    SpO2: 100% 100%    Last Pain:  Vitals:   04/13/18 0852  TempSrc:   PainSc: 0-No pain                 Martha Clan

## 2018-04-13 NOTE — Anesthesia Preprocedure Evaluation (Signed)
Anesthesia Evaluation  Patient identified by MRN, date of birth, ID band Patient awake    Reviewed: Allergy & Precautions, H&P , NPO status , Patient's Chart, lab work & pertinent test results, reviewed documented beta blocker date and time   History of Anesthesia Complications (+) PONV, DIFFICULT AIRWAY and history of anesthetic complications  Airway Mallampati: II  TM Distance: >3 FB Neck ROM: full    Dental no notable dental hx. (+) Dental Advidsory Given   Pulmonary former smoker,           Cardiovascular Exercise Tolerance: Good hypertension, On Medications      Neuro/Psych  Headaches, negative psych ROS   GI/Hepatic Neg liver ROS, GERD  Medicated,  Endo/Other  negative endocrine ROS  Renal/GU negative Renal ROS  negative genitourinary   Musculoskeletal   Abdominal   Peds negative pediatric ROS (+)  Hematology  (+) anemia ,   Anesthesia Other Findings Past Medical History: 09/24/2016: Abscessed tooth     Comment:  right lower tooth removed. No date: Anemia     Comment:  in past, prior to removal of uterine fibroids No date: Bladder infection No date: Difficult intubation No date: GERD (gastroesophageal reflux disease) No date: Headache     Comment:  migrains No date: Hypertension No date: Motion sickness     Comment:  all moving vehicles No date: Ovarian cyst No date: PONV (postoperative nausea and vomiting)   Reproductive/Obstetrics negative OB ROS                             Anesthesia Physical  Anesthesia Plan  ASA: II  Anesthesia Plan: General   Post-op Pain Management:    Induction: Intravenous  PONV Risk Score and Plan: 4 or greater and Propofol infusion  Airway Management Planned: Nasal Cannula  Additional Equipment:   Intra-op Plan:   Post-operative Plan:   Informed Consent: I have reviewed the patients History and Physical, chart, labs and  discussed the procedure including the risks, benefits and alternatives for the proposed anesthesia with the patient or authorized representative who has indicated his/her understanding and acceptance.   Dental advisory given  Plan Discussed with: CRNA and Surgeon  Anesthesia Plan Comments:         Anesthesia Quick Evaluation

## 2018-04-13 NOTE — H&P (Signed)
Cephas Darby, MD [address]  [place]  [place], [place] [address]  Main: [phone]  Fax: [phone] Pager: [phone]  Primary Care Physician:  Juline Patch, MD Primary Gastroenterologist:  Dr. Cephas Darby  Pre-Procedure History & Physical: HPI:  Linda Rhodes is a 42 y.o. female is here for an endoscopy.   Past Medical History:  Diagnosis Date  . Abscessed tooth 09/24/2016   right lower tooth removed.  . Anemia    in past, prior to removal of uterine fibroids  . Bladder infection   . Difficult intubation   . GERD (gastroesophageal reflux disease)   . Headache    migrains  . Hypertension   . Motion sickness    all moving vehicles  . Ovarian cyst   . PONV (postoperative nausea and vomiting)     Past Surgical History:  Procedure Laterality Date  . ABDOMINAL HYSTERECTOMY    . BACK SURGERY  2016   [facility] Neurosurgery - ruptured L4-5  . CESAREAN SECTION    . CHOLECYSTECTOMY    . CYSTOSCOPY N/A 05/06/2015   Procedure: CYSTOSCOPY;  Surgeon: Aletha Halim, MD;  Location: [facility];  Service: Gynecology;  Laterality: N/A;  . ETHMOIDECTOMY Right 11/25/2015   Procedure:  ANTERIOR ETHMOIDECTOMY;  Surgeon: Carloyn Manner, MD;  Location: [place];  Service: ENT;  Laterality: Right;  . FRONTAL SINUS EXPLORATION Right 11/25/2015   Procedure: FRONTAL SINUSOTOMY;  Surgeon: Carloyn Manner, MD;  Location: [place];  Service: ENT;  Laterality: Right;  . IMAGE GUIDED SINUS SURGERY N/A 11/25/2015   Procedure: IMAGE GUIDED SINUS SURGERY WITH BALLOON;  Surgeon: Carloyn Manner, MD;  Location: [place];  Service: ENT;  Laterality: N/A;  . LAPAROSCOPIC HYSTERECTOMY N/A 05/06/2015   Procedure: HYSTERECTOMY TOTAL LAPAROSCOPIC;  Surgeon: Aletha Halim, MD;  Location: [facility];  Service: Gynecology;  Laterality: N/A;  . LUMBAR LAMINECTOMY/DECOMPRESSION MICRODISCECTOMY N/A 10/04/2016   Procedure: right minimally invasive L5 S1  microdiscectomy;  Surgeon: Bayard Hugger, MD;  Location: [facility];  Service: Neurosurgery;  Laterality: N/A;  . MAXILLARY ANTROSTOMY Right 11/25/2015   Procedure: MAXILLARY ANTROSTOMY WITH TISSUE REMOVAL;  Surgeon: Carloyn Manner, MD;  Location: [place];  Service: ENT;  Laterality: Right;  . WISDOM TOOTH EXTRACTION  10/2115   3 teeth removed.    Prior to Admission medications   Medication Sig Start Date End Date Taking? Authorizing Provider  acetaminophen (TYLENOL) 500 MG tablet Take 1,000 mg by mouth every 6 (six) hours as needed for mild pain.   Yes [provider]  cyclobenzaprine (FLEXERIL) 10 MG tablet TAKE 1 TABLET BY MOUTH THREE TIMES A DAY AS NEEDED FOR MUSCLE SPASMS 03/01/18  Yes [provider]  cyclobenzaprine (FLEXERIL) 5 MG tablet Take 5 mg by mouth 3 (three) times daily as needed for muscle spasms.   Yes [provider]  famotidine (PEPCID) 20 MG tablet Take 1 tablet (20 mg total) by mouth 2 (two) times daily. 03/25/18  Yes Carrie Mew, MD  gabapentin (NEURONTIN) 400 MG capsule Take by mouth. 12/17/17 12/17/18 Yes [provider]  metoprolol succinate (TOPROL-XL) 100 MG 24 hr tablet Take 25 mg by mouth daily. 08/04/16  Yes [provider]  gabapentin (NEURONTIN) 300 MG capsule Take 1 capsule (300 mg total) by mouth 3 (three) times daily as needed for up to 7 days. 09/23/17 09/30/17  Multani, Bhupinder, NP  Menthol, Topical Analgesic, (BIOFREEZE EX) Apply 1 application topically 3 (three) times daily as needed (  back pain). Alternates between [employer] and [employer], Historical, MD  metoprolol succinate (TOPROL-XL) 25 MG 24 hr tablet Take by mouth.    [provider]  NICOTINE STEP 1 21 MG/24HR patch PLACE 1 PATCH (21 MG TOTAL) ONTO THE SKIN DAILY. Patient not taking: Reported on 04/07/2018 09/26/15   Juline Patch, MD  oxyCODONE (OXY IR/ROXICODONE) 5 MG immediate release tablet TAKE 1 TABLET (5 MG TOTAL) BY  MOUTH EVERY 6 (SIX) HOURS AS NEEDED FOR PAIN 02/14/18   [provider]  potassium chloride (K-DUR,KLOR-CON) 10 MEQ tablet Take by mouth. 12/17/17 12/17/18  [provider]  varenicline (CHANTIX) 1 MG tablet Take 1 tablet (1 mg total) by mouth 2 (two) times daily. Patient not taking: Reported on 04/07/2018 01/12/18   Juline Patch, MD    Allergies as of 04/07/2018 - Review Complete 04/07/2018  Allergen Reaction Noted  . Aspirin Other (See Comments) 08/04/2016  . Adhesive [tape] Rash 11/20/2015    Family History  Problem Relation Age of Onset  . Diabetes Mother   . Hypertension Mother   . Diabetes Father   . Hypertension Father     Social History   Socioeconomic History  . Marital status: Single    Spouse name: Not on file  . Number of children: Not on file  . Years of education: Not on file  . Highest education level: Not on file  Occupational History  . Not on file  Social Needs  . Financial resource strain: Not on file  . Food insecurity:    Worry: Not on file    Inability: Not on file  . Transportation needs:    Medical: Not on file    Non-medical: Not on file  Tobacco Use  . Smoking status: Former Smoker    Packs/day: 0.50    Years: 7.00    Pack years: 3.50    Types: Cigarettes    Last attempt to quit: 10/19/2015    Years since quitting: 2.4  . Smokeless tobacco: Never Used  Substance and Sexual Activity  . Alcohol use: Yes    Comment: socially - 1x/mo  . Drug use: No  . Sexual activity: Yes  Lifestyle  . Physical activity:    Days per week: Not on file    Minutes per session: Not on file  . Stress: Not on file  Relationships  . Social connections:    Talks on phone: Not on file    Gets together: Not on file    Attends religious service: Not on file    Active member of club or organization: Not on file    Attends meetings of clubs or organizations: Not on file    Relationship status: Not on file  . Intimate partner violence:    Fear of  current or ex partner: Not on file    Emotionally abused: Not on file    Physically abused: Not on file    Forced sexual activity: Not on file  Other Topics Concern  . Not on file  Social History Narrative  . Not on file    Review of Systems: See HPI, otherwise negative ROS  Physical Exam: BP (!) 140/99   Pulse 87   Temp (!) 96.9 F (36.1 C) (Tympanic)   Resp 20   Ht 5\' 7"  (1.702 m)   Wt 135 lb (61.2 kg)   LMP  (LMP Unknown)   SpO2 100%   BMI 21.14 kg/m  General:  Alert,  pleasant and cooperative in NAD Head:  Normocephalic and atraumatic. Neck:  Supple; no masses or thyromegaly. Lungs:  Clear throughout to auscultation.    Heart:  Regular rate and rhythm. Abdomen:  Soft, nontender and nondistended. Normal bowel sounds, without guarding, and without rebound.   Neurologic:  Alert and  oriented x4;  grossly normal neurologically.  Impression/Plan: Linda Rhodes is here for an endoscopy to be performed for hematemesis  Risks, benefits, limitations, and alternatives regarding  endoscopy have been reviewed with the patient.  Questions have been answered.  All parties agreeable.   Sherri Sear, MD  04/13/2018, 8:19 AM

## 2018-04-13 NOTE — Op Note (Signed)
[facility] Gastroenterology Patient Name: Linda Rhodes Procedure Date: 04/13/2018 7:56 AM MRN: [id number] Account #: [id number] Date of Birth: 12-01-1975 Admit Type: Outpatient Age: 42 Room: [facility] ENDO ROOM 4 Gender: Female Note Status: Finalized Procedure:            Upper GI endoscopy Indications:          Hematemesis Providers:            Lin Landsman MD, MD Referring MD:         Juline Patch, MD (Referring MD) Medicines:            Monitored Anesthesia Care Complications:        No immediate complications. Estimated blood loss: None. Procedure:            Pre-Anesthesia Assessment:                       - Prior to the procedure, a History and Physical was                        performed, and patient medications and allergies were                        reviewed. The patient is competent. The risks and                        benefits of the procedure and the sedation options and                        risks were discussed with the patient. All questions                        were answered and informed consent was obtained.                        Patient identification and proposed procedure were                        verified by the physician, the nurse, the                        anesthesiologist, the anesthetist and the technician in                        the pre-procedure area in the procedure room in the                        endoscopy suite. Mental Status Examination: alert and                        oriented. Airway Examination: normal oropharyngeal                        airway and neck mobility. Respiratory Examination:                        clear to auscultation. CV Examination: normal.                        Prophylactic Antibiotics: The patient does not require  prophylactic antibiotics. Prior Anticoagulants: The                        patient has taken no previous anticoagulant or   antiplatelet agents. ASA Grade Assessment: II - A                        patient with mild systemic disease. After reviewing the                        risks and benefits, the patient was deemed in                        satisfactory condition to undergo the procedure. The                        anesthesia plan was to use monitored anesthesia care                        (MAC). Immediately prior to administration of                        medications, the patient was re-assessed for adequacy                        to receive sedatives. The heart rate, respiratory rate,                        oxygen saturations, blood pressure, adequacy of                        pulmonary ventilation, and response to care were                        monitored throughout the procedure. The physical status                        of the patient was re-assessed after the procedure.                       After obtaining informed consent, the endoscope was                        passed under direct vision. Throughout the procedure,                        the patient's blood pressure, pulse, and oxygen                        saturations were monitored continuously. The Endoscope                        was introduced through the mouth, and advanced to the                        second part of duodenum. The upper GI endoscopy was                        accomplished without difficulty. The patient tolerated  the procedure well. Findings:      The duodenal bulb and second portion of the duodenum were normal.      Multiple dispersed, diminutive non-bleeding erosions were found in the       gastric body and in the gastric antrum. There were stigmata of recent       bleeding. Biopsies were taken with a cold forceps for Helicobacter       pylori testing.      The cardia and gastric fundus were normal on retroflexion.      The gastroesophageal junction and examined esophagus were normal. Impression:            - Normal duodenal bulb and second portion of the                        duodenum.                       - Non-bleeding erosive gastropathy. Biopsied.                       - Normal gastroesophageal junction and esophagus. Recommendation:       - Await pathology results.                       - Discharge patient to home (with escort).                       - Resume previous diet today.                       - Continue present medications. Procedure Code(s):    --- Professional ---                       [phone], Esophagogastroduodenoscopy, flexible, transoral;                        with biopsy, single or multiple Diagnosis Code(s):    --- Professional ---                       K31.89, Other diseases of stomach and duodenum                       K92.0, Hematemesis CPT copyright 2017 American Medical Association. All rights reserved. The codes documented in this report are preliminary and upon coder review may  be revised to meet current compliance requirements. Dr. Ulyess Mort Lin Landsman MD, MD 04/13/2018 8:31:18 AM This report has been signed electronically. Number of Addenda: 0 Note Initiated On: 04/13/2018 7:56 AM      [facility]

## 2018-04-13 NOTE — Transfer of Care (Signed)
Immediate Anesthesia Transfer of Care Note  Patient: Linda Rhodes  Procedure(s) Performed: ESOPHAGOGASTRODUODENOSCOPY (EGD) WITH PROPOFOL (N/A )  Patient Location: PACU and Endoscopy Unit  Anesthesia Type:General  Level of Consciousness: awake, alert  and oriented  Airway & Oxygen Therapy: Patient Spontanous Breathing  Post-op Assessment: Report given to RN and Post -op Vital signs reviewed and stable  Post vital signs: Reviewed and stable  Last Vitals:  Vitals Value Taken Time  BP    Temp    Pulse    Resp    SpO2      Last Pain:  Vitals:   04/13/18 0750  TempSrc: Tympanic  PainSc: 0-No pain         Complications: No apparent anesthesia complications

## 2018-04-14 ENCOUNTER — Encounter: Payer: Self-pay | Admitting: Gastroenterology

## 2018-04-16 LAB — SURGICAL PATHOLOGY

## 2018-04-25 IMAGING — MR MR LUMBAR SPINE W/O CM
4 of 5 series · 24 of 48 positions shown · non-contrast
Comparison: 04/23/2014

CLINICAL DATA: Prior history of lumbar spine surgery. Right leg
pain. Low back pain.

EXAM:
MRI LUMBAR SPINE WITHOUT CONTRAST
TECHNIQUE: Multiplanar, multisequence MR imaging of the lumbar spine was
performed. No intravenous contrast was administered.

[Series 2: T2 · sagittal · 4.0mm · 0.81mm/px · 6 of 15 slices shown (1 of 2)]
[im 1/15]
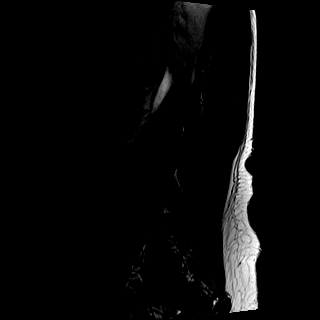
[im 3/15]
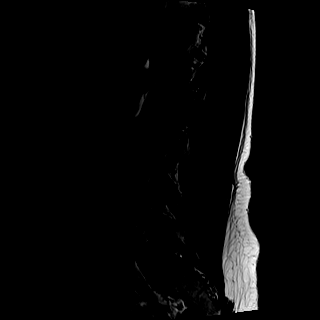
[im 6/15]
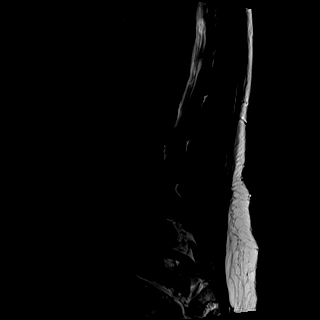
[im 9/15]
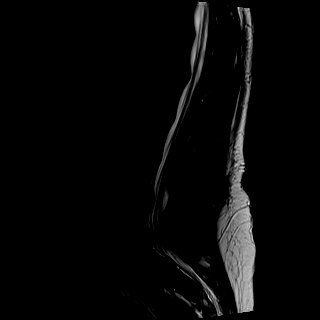
[im 12/15]
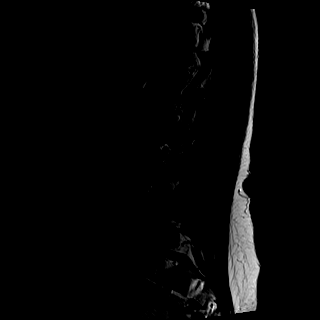
[im 15/15]
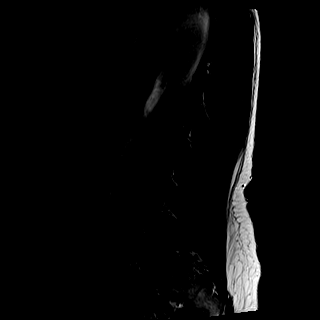

[Series 3: T1 · sagittal · 4.0mm · 0.41mm/px · 6 of 15 slices shown (1 of 2)]
[im 1/15]
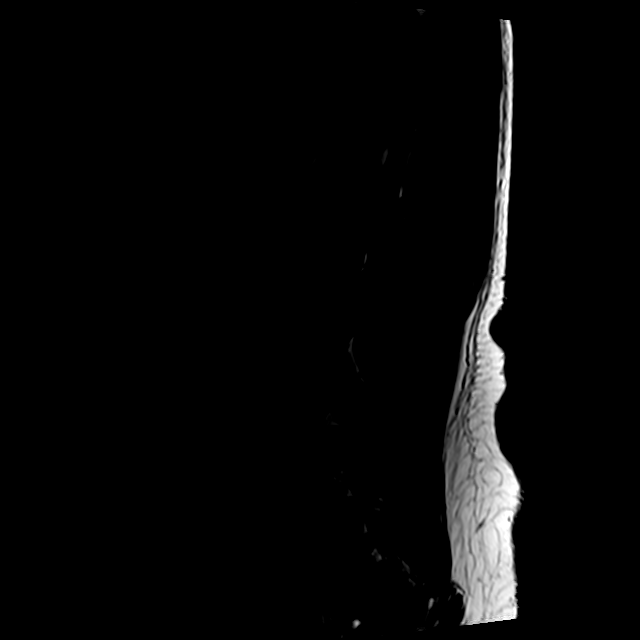
[im 3/15]
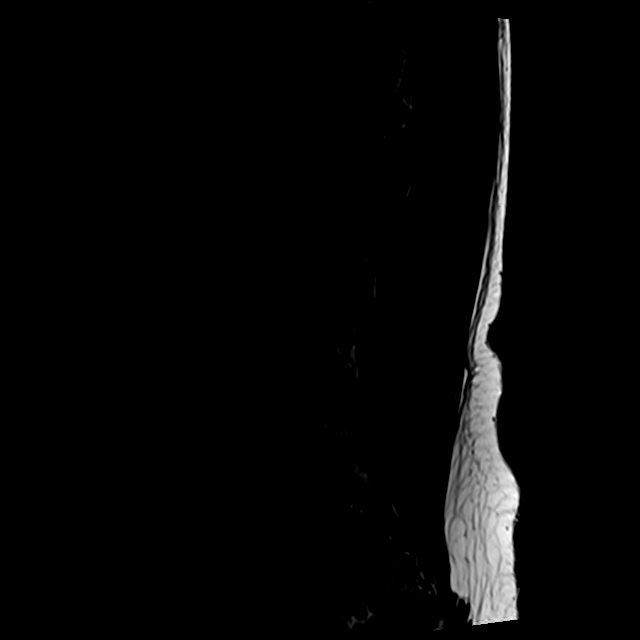
[im 6/15]
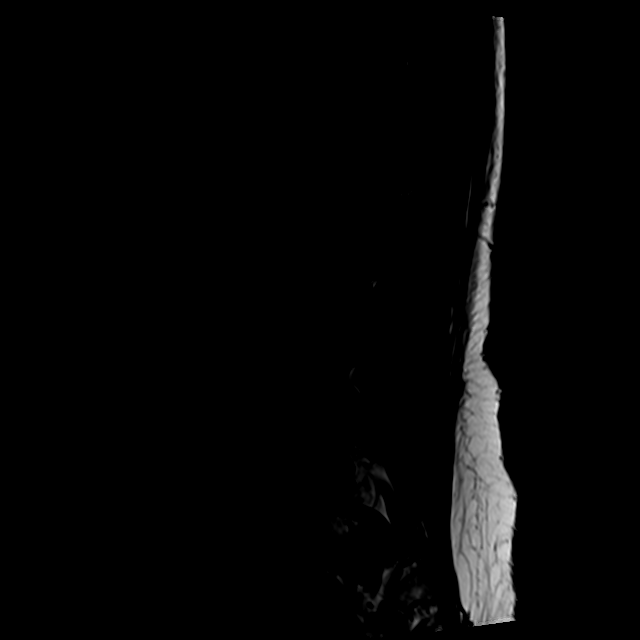
[im 9/15]
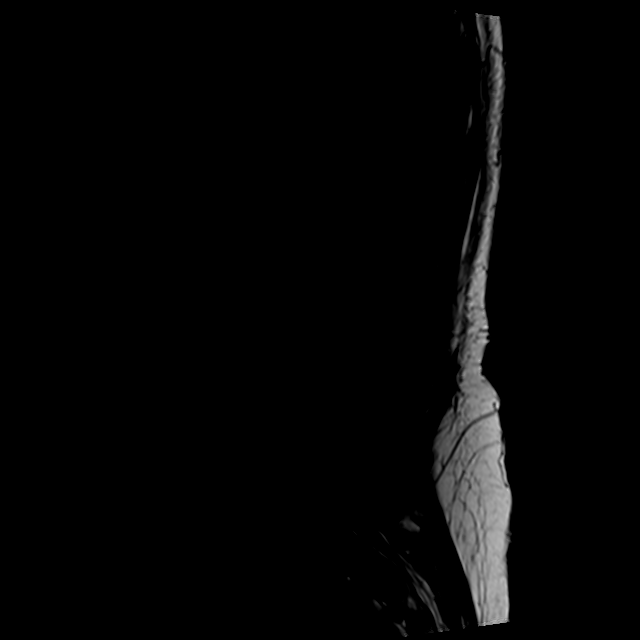
[im 12/15]
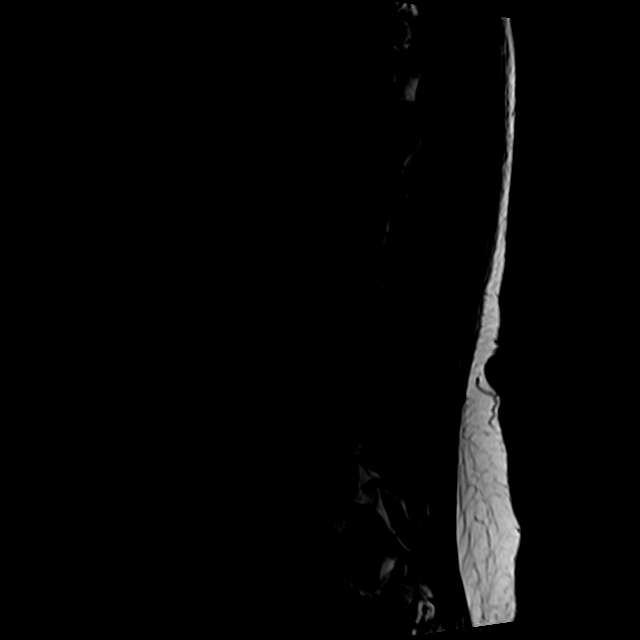
[im 15/15]
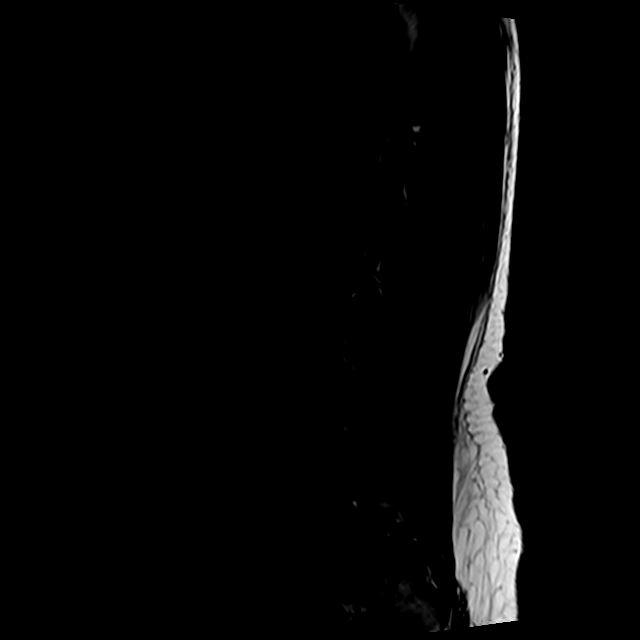

[Series 5: T2 · axial · 4.0mm · 0.78mm/px · z∈[-18,+205]mm · 9 of 41 slices shown (2 of 2)]
[im 1/41]
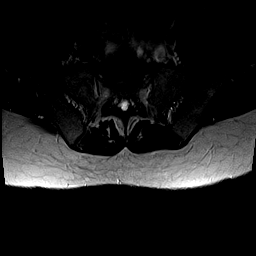
[im 6/41]
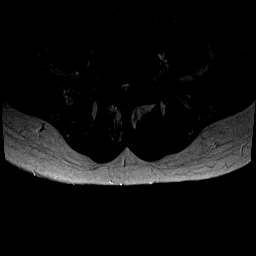
[im 12/41]
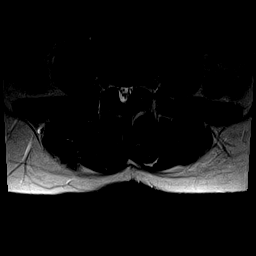
[im 18/41]
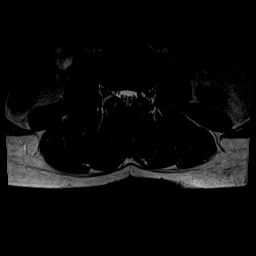
[im 21/41]
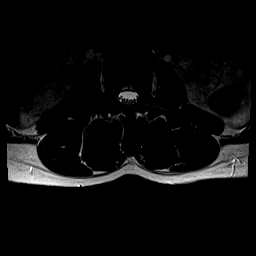
[im 23/41]
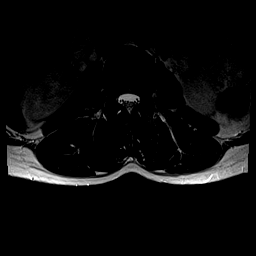
[im 29/41]
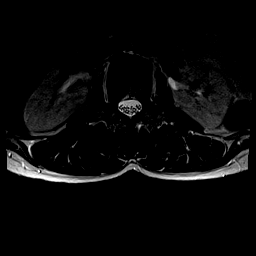
[im 35/41]
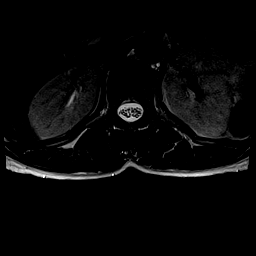
[im 41/41]
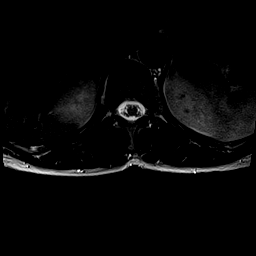

[Series 6: T1 · axial · 4.0mm · 0.31mm/px · z∈[+6,+175]mm · 3 of 41 slices shown (2 of 2)]
[im 6/41]
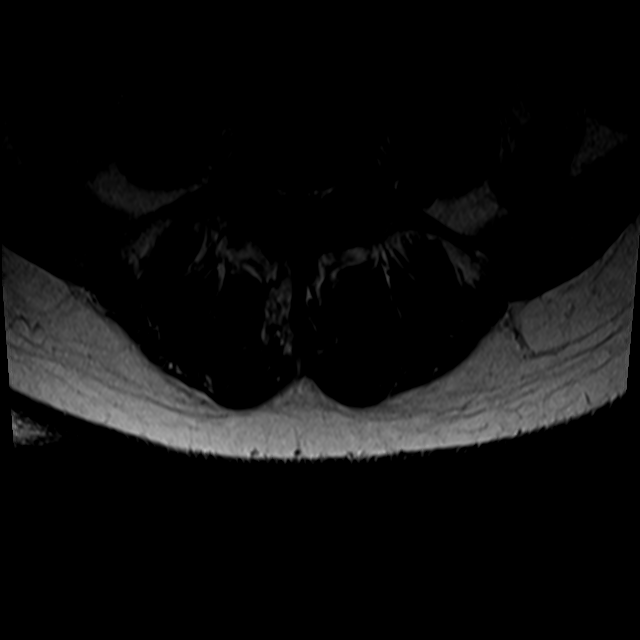
[im 21/41]
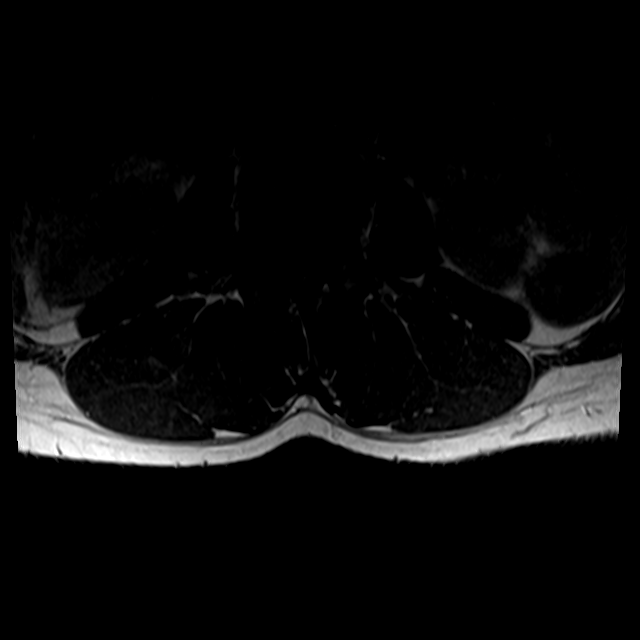
[im 35/41]
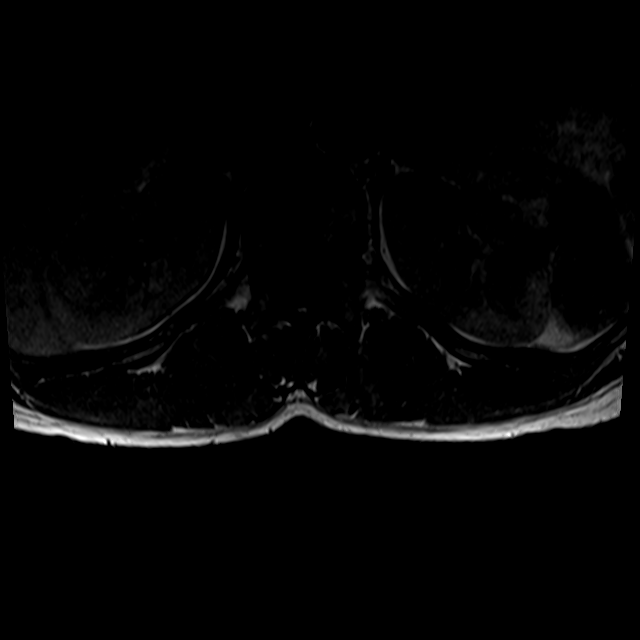

[24 of 48 positions shown; findings below may reference images not displayed]

FINDINGS: Segmentation:  Standard.

Alignment:  Physiologic.

Vertebrae:  No fracture, evidence of discitis, or bone lesion.

Conus medullaris: Extends to the T12 level and appears normal.

Paraspinal and other soft tissues: Negative

Disc levels:

Disc spaces: Degenerative disc disease with disc desiccation at L4-5
and L5-S1.

T12-L1: Mild broad-based disc bulge. No evidence of neural foraminal
stenosis. No central canal stenosis.

L1-L2: Mild broad-based disc bulge with a small right paracentral
disc protrusion. No evidence of neural foraminal stenosis. No
central canal stenosis.

L2-L3: No significant disc bulge. No evidence of neural foraminal
stenosis. No central canal stenosis.

L3-L4: No significant disc bulge. No evidence of neural foraminal
stenosis. No central canal stenosis.

L4-L5: Right L4 laminotomy defect. Mild broad-based disc bulge with
a small central disc protrusion. Mild bilateral facet arthropathy.
Mild spinal stenosis and bilateral lateral recess stenosis. No
foraminal stenosis.

L5-S1: Moderate size broad-based disc bulge with moderate bilateral
facet arthropathy resulting in moderate spinal stenosis and
bilateral lateral recess stenosis. Mild right foraminal stenosis. No
left foraminal stenosis.
IMPRESSION: 1. At L5-S1 there is a moderate broad-based disc bulge with moderate
bilateral facet arthropathy resulting in moderate spinal stenosis
and bilateral lateral recess stenosis. Mild right foraminal
stenosis. No left foraminal stenosis.
2. At L4-5 there is a mild broad-based disc bulge with a small
central disc protrusion. Mild bilateral facet arthropathy. Mild
spinal stenosis and bilateral lateral recess stenosis.

## 2018-07-10 ENCOUNTER — Ambulatory Visit: Payer: BLUE CROSS/BLUE SHIELD | Admitting: Gastroenterology

## 2018-07-18 ENCOUNTER — Ambulatory Visit
Admission: EM | Admit: 2018-07-18 | Discharge: 2018-07-18 | Disposition: A | Discharge status: Home / Self Care | Payer: BLUE CROSS/BLUE SHIELD | Attending: Emergency Medicine | Admitting: Emergency Medicine

## 2018-07-18 ENCOUNTER — Other Ambulatory Visit: Payer: Self-pay

## 2018-07-18 ENCOUNTER — Encounter: Payer: Self-pay | Admitting: Emergency Medicine

## 2018-07-18 DIAGNOSIS — B029 Zoster without complications: Secondary | ICD-10-CM | POA: Diagnosis present

## 2018-07-18 DIAGNOSIS — R030 Elevated blood-pressure reading, without diagnosis of hypertension: Secondary | ICD-10-CM

## 2018-07-18 MED ORDER — VALACYCLOVIR HCL 1 G PO TABS: 1000.0000 mg | ORAL_TABLET | Freq: Three times a day (TID) | ORAL | 0 refills | Status: DC

## 2018-07-18 NOTE — ED Provider Notes (Signed)
[facility]    CSN: [id number] Arrival date & time: 07/18/18  0820     History   Chief Complaint Chief Complaint  Patient presents with  . Rash    HPI Linda Rhodes is a 42 y.o. female.   The history is provided by the patient. No language interpreter was used.  Rash  Location:  Torso Torso rash location:  R chest (anterior chest, not crossing midline) Quality: burning and painful   Pain details:    Quality:  Burning   Severity:  Mild   Onset quality:  Sudden   Timing:  Intermittent   Progression:  Worsening Severity:  Moderate Onset quality:  Gradual Timing:  Constant Progression:  Worsening Chronicity:  Recurrent Context comment:  Hx of shingles Relieved by:  Nothing Worsened by:  Heat Ineffective treatments:  None tried Associated symptoms: fatigue and myalgias   Associated symptoms: no fever     Past Medical History:  Diagnosis Date  . Abscessed tooth 09/24/2016   right lower tooth removed.  . Anemia    in past, prior to removal of uterine fibroids  . Bladder infection   . Difficult intubation   . GERD (gastroesophageal reflux disease)   . Headache    migrains  . Hypertension   . Motion sickness    all moving vehicles  . Ovarian cyst   . PONV (postoperative nausea and vomiting)     Patient Active Problem List   Diagnosis Date Noted  . Herpes zoster without complication 40/98/1191  . Elevated blood pressure reading 07/18/2018  . S/P hysterectomy 05/06/2015    Past Surgical History:  Procedure Laterality Date  . ABDOMINAL HYSTERECTOMY    . BACK SURGERY  2016   [facility] Neurosurgery - ruptured L4-5  . CESAREAN SECTION    . CHOLECYSTECTOMY    . CYSTOSCOPY N/A 05/06/2015   Procedure: CYSTOSCOPY;  Surgeon: Aletha Halim, MD;  Location: [facility]S;  Service: Gynecology;  Laterality: N/A;  . ESOPHAGOGASTRODUODENOSCOPY (EGD) WITH PROPOFOL N/A 04/13/2018   Procedure: ESOPHAGOGASTRODUODENOSCOPY (EGD) WITH PROPOFOL;  Surgeon: Lin Landsman, MD;  Location: [facility] ENDOSCOPY;  Service: Gastroenterology;  Laterality: N/A;  . ETHMOIDECTOMY Right 11/25/2015   Procedure:  ANTERIOR ETHMOIDECTOMY;  Surgeon: Carloyn Manner, MD;  Location: [place];  Service: ENT;  Laterality: Right;  . FRONTAL SINUS EXPLORATION Right 11/25/2015   Procedure: FRONTAL SINUSOTOMY;  Surgeon: Carloyn Manner, MD;  Location: [place];  Service: ENT;  Laterality: Right;  . IMAGE GUIDED SINUS SURGERY N/A 11/25/2015   Procedure: IMAGE GUIDED SINUS SURGERY WITH BALLOON;  Surgeon: Carloyn Manner, MD;  Location: [place];  Service: ENT;  Laterality: N/A;  . LAPAROSCOPIC HYSTERECTOMY N/A 05/06/2015   Procedure: HYSTERECTOMY TOTAL LAPAROSCOPIC;  Surgeon: Aletha Halim, MD;  Location: [facility]S;  Service: Gynecology;  Laterality: N/A;  . LUMBAR LAMINECTOMY/DECOMPRESSION MICRODISCECTOMY N/A 10/04/2016   Procedure: right minimally invasive L5 S1 microdiscectomy;  Surgeon: Bayard Hugger, MD;  Location: [facility];  Service: Neurosurgery;  Laterality: N/A;  . MAXILLARY ANTROSTOMY Right 11/25/2015   Procedure: MAXILLARY ANTROSTOMY WITH TISSUE REMOVAL;  Surgeon: Carloyn Manner, MD;  Location: [place];  Service: ENT;  Laterality: Right;  . WISDOM TOOTH EXTRACTION  10/2115   3 teeth removed.    OB History   None      Home Medications    Prior to Admission medications   Medication Sig Start Date End Date Taking? Authorizing Provider  cyclobenzaprine (FLEXERIL) 10 MG tablet TAKE 1  TABLET BY MOUTH THREE TIMES A DAY AS NEEDED FOR MUSCLE SPASMS 03/01/18  Yes [provider]  famotidine (PEPCID) 20 MG tablet Take 1 tablet (20 mg total) by mouth 2 (two) times daily. 03/25/18  Yes Carrie Mew, MD  gabapentin (NEURONTIN) 300 MG capsule Take 1 capsule (300 mg total) by mouth 3 (three) times daily as needed for up to 7 days. 09/23/17 07/18/18 Yes Multani, Bhupinder, NP  metoprolol succinate (TOPROL-XL) 100 MG 24 hr tablet  Take 25 mg by mouth daily. 08/04/16  Yes [provider]  acetaminophen (TYLENOL) 500 MG tablet Take 1,000 mg by mouth every 6 (six) hours as needed for mild pain.    [provider]  cyclobenzaprine (FLEXERIL) 5 MG tablet Take 5 mg by mouth 3 (three) times daily as needed for muscle spasms.    [provider]  gabapentin (NEURONTIN) 400 MG capsule Take by mouth. 12/17/17 12/17/18  [provider]  Menthol, Topical Analgesic, (BIOFREEZE EX) Apply 1 application topically 3 (three) times daily as needed (back pain). Alternates between [employer] and [employer], Historical, MD  metoprolol succinate (TOPROL-XL) 25 MG 24 hr tablet Take by mouth.    [provider]  NICOTINE STEP 1 21 MG/24HR patch PLACE 1 PATCH (21 MG TOTAL) ONTO THE SKIN DAILY. Patient not taking: Reported on 04/07/2018 09/26/15   Juline Patch, MD  oxyCODONE (OXY IR/ROXICODONE) 5 MG immediate release tablet TAKE 1 TABLET (5 MG TOTAL) BY MOUTH EVERY 6 (SIX) HOURS AS NEEDED FOR PAIN 02/14/18   [provider]  potassium chloride (K-DUR,KLOR-CON) 10 MEQ tablet Take by mouth. 12/17/17 12/17/18  [provider]  valACYclovir (VALTREX) 1000 MG tablet Take 1 tablet (1,000 mg total) by mouth 3 (three) times daily. 34/1/93   Terea Neubauer, Jeanett Schlein, NP  varenicline (CHANTIX) 1 MG tablet Take 1 tablet (1 mg total) by mouth 2 (two) times daily. Patient not taking: Reported on 04/07/2018 01/12/18   Juline Patch, MD    Family History Family History  Problem Relation Age of Onset  . Diabetes Mother   . Hypertension Mother   . Diabetes Father   . Hypertension Father     Social History Social History   Tobacco Use  . Smoking status: Former Smoker    Packs/day: 0.50    Years: 7.00    Pack years: 3.50    Types: Cigarettes    Last attempt to quit: 10/19/2015    Years since quitting: 2.7  . Smokeless tobacco: Never Used  Substance Use Topics  . Alcohol use: Yes    Comment:  socially - 1x/mo  . Drug use: No     Allergies   Aspirin and Adhesive [tape]   Review of Systems Review of Systems  Constitutional: Positive for fatigue. Negative for fever.  HENT: Negative.   Eyes: Negative.   Respiratory: Negative.   Cardiovascular: Negative.   Gastrointestinal: Negative.   Endocrine: Negative.   Musculoskeletal: Positive for myalgias.  Skin: Positive for color change and rash.  Allergic/Immunologic: Negative for immunocompromised state.  Neurological: Negative.   Hematological: Negative.   Psychiatric/Behavioral: Negative.   All other systems reviewed and are negative.    Physical Exam Triage Vital Signs ED Triage Vitals  Enc Vitals Group     BP 07/18/18 0830 (!) 143/105     Pulse Rate 07/18/18 0830 82     Resp 07/18/18 0830 16     Temp 07/18/18 0830 98.4 F (36.9 C)  Temp Source 07/18/18 0830 Oral     SpO2 07/18/18 0830 100 %     Weight 07/18/18 0828 140 lb (63.5 kg)     Height 07/18/18 0828 5\' 8"  (1.727 m)     Head Circumference --      Peak Flow --      Pain Score 07/18/18 0828 5     Pain Loc --      Pain Edu? --      Excl. in [place]? --    No data found.  Updated Vital Signs BP (!) 143/105 (BP Location: Left Arm) Comment: Patient has not taken her BP medicine today  Pulse 82   Temp 98.4 F (36.9 C) (Oral)   Resp 16   Ht 5\' 8"  (1.727 m)   Wt 140 lb (63.5 kg)   LMP  (LMP Unknown)   SpO2 100%   BMI 21.29 kg/m    Physical Exam  Constitutional: She is oriented to person, place, and time. She appears well-developed and well-nourished. She is active and cooperative. No distress.  HENT:  Head: Normocephalic.  Eyes: Pupils are equal, round, and reactive to light.  Neck: Normal range of motion.  Cardiovascular: Normal rate.  Pulmonary/Chest: Effort normal.  Musculoskeletal: Normal range of motion. She exhibits no edema or tenderness.  Lymphadenopathy:    She has no cervical adenopathy.  Neurological: She is alert and oriented to  person, place, and time. No cranial nerve deficit or sensory deficit. GCS eye subscore is 4. GCS verbal subscore is 5. GCS motor subscore is 6.  Skin: Skin is warm and dry. Rash noted. Rash is vesicular. There is erythema.  Psychiatric: She has a normal mood and affect. Her speech is normal and behavior is normal.  Nursing note and vitals reviewed.    UC Treatments / Results  Labs (all labs ordered are listed, but only abnormal results are displayed) Labs Reviewed - No data to display  EKG None  Radiology No results found.  Procedures Procedures (including critical care time)  Medications Ordered in UC Medications - No data to display  Initial Impression / Assessment and Plan / UC Course  I have reviewed the triage vital signs and the nursing notes.  Pertinent labs & imaging results that were available during my care of the patient were reviewed by me and considered in my medical decision making (see chart for details).      Final Clinical Impressions(s) / UC Diagnoses   Final diagnoses:  Herpes zoster without complication  Elevated blood pressure reading     Discharge Instructions     Please follow up with your PCP. Take meds as directed. Return ot UC as needed    ED Prescriptions    Medication Sig Dispense Auth. Provider   valACYclovir (VALTREX) 1000 MG tablet Take 1 tablet (1,000 mg total) by mouth 3 (three) times daily. 21 tablet Laela Deviney, Jeanett Schlein, NP     Controlled Substance Prescriptions    Tori Milks, NP 82/95/62 [phone]

## 2018-07-18 NOTE — Discharge Instructions (Addendum)
Please follow up with your PCP. Take meds as directed. Return ot UC as needed

## 2018-07-18 NOTE — ED Triage Notes (Signed)
Patient c/o burning rash on her chest that started started yesterday.  Patient reports history of shingles.

## 2018-08-18 ENCOUNTER — Encounter: Payer: Self-pay | Admitting: Gastroenterology

## 2018-08-18 ENCOUNTER — Other Ambulatory Visit: Payer: Self-pay

## 2018-08-18 ENCOUNTER — Emergency Department: Payer: BLUE CROSS/BLUE SHIELD

## 2018-08-18 ENCOUNTER — Encounter: Payer: Self-pay | Admitting: Intensive Care

## 2018-08-18 ENCOUNTER — Ambulatory Visit: Payer: BLUE CROSS/BLUE SHIELD | Admitting: Gastroenterology

## 2018-08-18 ENCOUNTER — Emergency Department
Admission: EM | Admit: 2018-08-18 | Discharge: 2018-08-18 | Disposition: A | Discharge status: Home / Self Care | Payer: BLUE CROSS/BLUE SHIELD | Attending: Emergency Medicine | Admitting: Emergency Medicine

## 2018-08-18 VITALS — BP 198/131 | HR 87 | Resp 17 | Wt 142.0 lb

## 2018-08-18 DIAGNOSIS — Z79899 Other long term (current) drug therapy: Secondary | ICD-10-CM | POA: Diagnosis not present

## 2018-08-18 DIAGNOSIS — Z87891 Personal history of nicotine dependence: Secondary | ICD-10-CM | POA: Insufficient documentation

## 2018-08-18 DIAGNOSIS — R51 Headache: Secondary | ICD-10-CM

## 2018-08-18 DIAGNOSIS — I10 Essential (primary) hypertension: Secondary | ICD-10-CM | POA: Diagnosis not present

## 2018-08-18 DIAGNOSIS — R1010 Upper abdominal pain, unspecified: Secondary | ICD-10-CM

## 2018-08-18 DIAGNOSIS — Z9049 Acquired absence of other specified parts of digestive tract: Secondary | ICD-10-CM | POA: Insufficient documentation

## 2018-08-18 DIAGNOSIS — R519 Headache, unspecified: Secondary | ICD-10-CM

## 2018-08-18 LAB — CBC WITH DIFFERENTIAL/PLATELET
ABS IMMATURE GRANULOCYTES: 0.01 10*3/uL (ref 0.00–0.07)
BASOS ABS: 0.1 10*3/uL (ref 0.0–0.1)
Basophils Relative: 1 %
EOS PCT: 2 %
Eosinophils Absolute: 0.1 10*3/uL (ref 0.0–0.5)
HEMATOCRIT: 42.1 % (ref 36.0–46.0)
Hemoglobin: 14.8 g/dL (ref 12.0–15.0)
IMMATURE GRANULOCYTES: 0 %
LYMPHS ABS: 1.7 10*3/uL (ref 0.7–4.0)
LYMPHS PCT: 28 %
MCH: 32.4 pg (ref 26.0–34.0)
MCHC: 35.2 g/dL (ref 30.0–36.0)
MCV: 92.1 fL (ref 80.0–100.0)
MONO ABS: 0.5 10*3/uL (ref 0.1–1.0)
MONOS PCT: 9 %
Neutro Abs: 3.6 10*3/uL (ref 1.7–7.7)
Neutrophils Relative %: 60 %
Platelets: 237 10*3/uL (ref 150–400)
RBC: 4.57 MIL/uL (ref 3.87–5.11)
RDW: 13.1 % (ref 11.5–15.5)
SMEAR REVIEW: NORMAL
WBC: 5.9 10*3/uL (ref 4.0–10.5)
nRBC: 0 % (ref 0.0–0.2)

## 2018-08-18 LAB — BASIC METABOLIC PANEL
ANION GAP: 11 (ref 5–15)
BUN: 12 mg/dL (ref 6–20)
CO2: 26 mmol/L (ref 22–32)
Calcium: 8.7 mg/dL — ABNORMAL LOW (ref 8.9–10.3)
Chloride: 101 mmol/L (ref 98–111)
Creatinine, Ser: 0.81 mg/dL (ref 0.44–1.00)
Glucose, Bld: 101 mg/dL — ABNORMAL HIGH (ref 70–99)
Potassium: 3.4 mmol/L — ABNORMAL LOW (ref 3.5–5.1)
Sodium: 138 mmol/L (ref 135–145)

## 2018-08-18 LAB — TROPONIN I: Troponin I: <0.03 ng/mL (ref <0.03)

## 2018-08-18 MED ORDER — DIPHENHYDRAMINE HCL 50 MG/ML IJ SOLN
25.0000 mg | Freq: Once | INTRAMUSCULAR | Status: AC
  Administered 2018-08-18: 25 mg via INTRAVENOUS
  Filled 2018-08-18: qty 1

## 2018-08-18 MED ORDER — SODIUM CHLORIDE 0.9 % IV BOLUS
1000.0000 mL | Freq: Once | INTRAVENOUS | Status: AC
  Administered 2018-08-18: 1000 mL via INTRAVENOUS

## 2018-08-18 MED ORDER — PANTOPRAZOLE SODIUM 40 MG PO TBEC: 40.0000 mg | DELAYED_RELEASE_TABLET | Freq: Two times a day (BID) | ORAL | 0 refills | Status: DC

## 2018-08-18 MED ORDER — KETOROLAC TROMETHAMINE 30 MG/ML IJ SOLN
30.0000 mg | Freq: Once | INTRAMUSCULAR | Status: AC
  Administered 2018-08-18: 30 mg via INTRAVENOUS
  Filled 2018-08-18: qty 1

## 2018-08-18 MED ORDER — PROCHLORPERAZINE EDISYLATE 10 MG/2ML IJ SOLN
10.0000 mg | Freq: Once | INTRAMUSCULAR | Status: AC
  Administered 2018-08-18: 10 mg via INTRAVENOUS
  Filled 2018-08-18: qty 2

## 2018-08-18 NOTE — ED Notes (Signed)
Patient ambulatory to lobby with steady gait and NAD noted. Reports relief of symptoms. Patient verbalized understanding of discharge instructions and follow-up care.

## 2018-08-18 NOTE — Progress Notes (Signed)
Cephas Darby, MD [address]  [place]  [place], [place] [address]  Main: [phone]  Fax: [phone]    Gastroenterology Consultation  Referring Provider:     Juline Patch, MD Primary Care Physician:  Hubbard Hartshorn, FNP Primary Gastroenterologist:  Dr. Cephas Darby Reason for Consultation:   Epigastric pain        HPI:   Linda Rhodes is a 42 y.o. African-American female with chronic alcohol use referred by Dr. Uvaldo Rising, Astrid Divine, FNP  for consultation & management of chronic upper abdominal pain associated with intermittent episodes of nausea and vomiting followed by hematemesis and dark stools. Otherwise, her bowel movements are generally brown. Since age of 40, she has been drinking alcohol 2 beers daily and sometimes more. She lives with her female partner. She was recently in the year because of nausea vomiting and abdominal pain, hemoglobin was normal, LFTs were normal. She smokes marijuana. She is concerned that she is not able to gain weight but hasn't lost weight recently.  Follow-up visit 08/18/2018 Patient underwent EGD since her first visit and it was unremarkable.  There was no evidence of H. Pylori or peptic ulcer disease or varices.  She continues to have upper abdominal discomfort.  She reports having nausea and headache today.  Her blood pressure is elevated in office today.  She said that she stopped seeing her PCP because every time she calls her office she was told to go to urgent care.  She is not taking any maintenance blood pressure medications.  She continues to drink alcohol.  NSAIDs: none  Antiplts/Anticoagulants/Anti thrombotics: none  GI Procedures:  EGD 03/2018 - Normal duodenal bulb and second portion of the duodenum. - Non-bleeding erosive gastropathy. Biopsied. - Normal gastroesophageal junction and esophagus. DIAGNOSIS:  A. STOMACH; RANDOM COLD BIOPSY:  - MILD REACTIVE GASTROPATHY.  - NEGATIVE FOR ACTIVE INFLAMMATION, H. PYLORI,  INTESTINAL METAPLASIA,  DYSPLASIA, AND MALIGNANCY.  Grandmother with colon cancer in her 57s  Past Medical History:  Diagnosis Date  . Abscessed tooth 09/24/2016   right lower tooth removed.  . Anemia    in past, prior to removal of uterine fibroids  . Bladder infection   . Difficult intubation   . GERD (gastroesophageal reflux disease)   . Headache    migrains  . Hypertension   . Motion sickness    all moving vehicles  . Ovarian cyst   . PONV (postoperative nausea and vomiting)     Past Surgical History:  Procedure Laterality Date  . ABDOMINAL HYSTERECTOMY    . BACK SURGERY  2016   [facility] Neurosurgery - ruptured L4-5  . CESAREAN SECTION    . CHOLECYSTECTOMY    . CYSTOSCOPY N/A 05/06/2015   Procedure: CYSTOSCOPY;  Surgeon: Aletha Halim, MD;  Location: [facility] ORS;  Service: Gynecology;  Laterality: N/A;  . ESOPHAGOGASTRODUODENOSCOPY (EGD) WITH PROPOFOL N/A 04/13/2018   Procedure: ESOPHAGOGASTRODUODENOSCOPY (EGD) WITH PROPOFOL;  Surgeon: Lin Landsman, MD;  Location: [facility] ENDOSCOPY;  Service: Gastroenterology;  Laterality: N/A;  . ETHMOIDECTOMY Right 11/25/2015   Procedure:  ANTERIOR ETHMOIDECTOMY;  Surgeon: Carloyn Manner, MD;  Location: [place];  Service: ENT;  Laterality: Right;  . FRONTAL SINUS EXPLORATION Right 11/25/2015   Procedure: FRONTAL SINUSOTOMY;  Surgeon: Carloyn Manner, MD;  Location: [place];  Service: ENT;  Laterality: Right;  . IMAGE GUIDED SINUS SURGERY N/A 11/25/2015   Procedure: IMAGE GUIDED SINUS SURGERY WITH BALLOON;  Surgeon: Carloyn Manner, MD;  Location:  [place];  Service: ENT;  Laterality: N/A;  . LAPAROSCOPIC HYSTERECTOMY N/A 05/06/2015   Procedure: HYSTERECTOMY TOTAL LAPAROSCOPIC;  Surgeon: Aletha Halim, MD;  Location: [facility] ORS;  Service: Gynecology;  Laterality: N/A;  . LUMBAR LAMINECTOMY/DECOMPRESSION MICRODISCECTOMY N/A 10/04/2016   Procedure: right minimally invasive L5 S1 microdiscectomy;  Surgeon:  Bayard Hugger, MD;  Location: [facility] ORS;  Service: Neurosurgery;  Laterality: N/A;  . MAXILLARY ANTROSTOMY Right 11/25/2015   Procedure: MAXILLARY ANTROSTOMY WITH TISSUE REMOVAL;  Surgeon: Carloyn Manner, MD;  Location: [place];  Service: ENT;  Laterality: Right;  . WISDOM TOOTH EXTRACTION  10/2115   3 teeth removed.    Current Outpatient Medications:  .  acetaminophen (TYLENOL) 500 MG tablet, Take 1,000 mg by mouth every 6 (six) hours as needed for mild pain., Disp: , Rfl:  .  cyclobenzaprine (FLEXERIL) 5 MG tablet, Take 5 mg by mouth 3 (three) times daily as needed for muscle spasms., Disp: , Rfl:  .  famotidine (PEPCID) 20 MG tablet, Take 1 tablet (20 mg total) by mouth 2 (two) times daily., Disp: 60 tablet, Rfl: 0 .  gabapentin (NEURONTIN) 400 MG capsule, Take by mouth., Disp: , Rfl:  .  metoprolol succinate (TOPROL-XL) 25 MG 24 hr tablet, Take by mouth., Disp: , Rfl:  .  valACYclovir (VALTREX) 1000 MG tablet, Take 1 tablet (1,000 mg total) by mouth 3 (three) times daily., Disp: 21 tablet, Rfl: 0 .  cyclobenzaprine (FLEXERIL) 10 MG tablet, TAKE 1 TABLET BY MOUTH THREE TIMES A DAY AS NEEDED FOR MUSCLE SPASMS, Disp: , Rfl: 2 .  cyclobenzaprine (FLEXERIL) 10 MG tablet, Take by mouth., Disp: , Rfl:  .  gabapentin (NEURONTIN) 300 MG capsule, Take 1 capsule (300 mg total) by mouth 3 (three) times daily as needed for up to 7 days., Disp: 21 capsule, Rfl: 9 .  Menthol, Topical Analgesic, (BIOFREEZE EX), Apply 1 application topically 3 (three) times daily as needed (back pain). Alternates between [employer] and [employer], Disp: , Rfl:  .  metoprolol succinate (TOPROL-XL) 100 MG 24 hr tablet, Take 25 mg by mouth daily., Disp: , Rfl: 0 .  NICOTINE STEP 1 21 MG/24HR patch, PLACE 1 PATCH (21 MG TOTAL) ONTO THE SKIN DAILY. (Patient not taking: Reported on 04/07/2018), Disp: 28 patch, Rfl: 0 .  oxyCODONE (OXY IR/ROXICODONE) 5 MG immediate release tablet, TAKE 1 TABLET (5 MG TOTAL) BY MOUTH EVERY 6  (SIX) HOURS AS NEEDED FOR PAIN, Disp: , Rfl: 0 .  pantoprazole (PROTONIX) 40 MG tablet, Take 1 tablet (40 mg total) by mouth 2 (two) times daily before a meal., Disp: 180 tablet, Rfl: 0 .  potassium chloride (K-DUR,KLOR-CON) 10 MEQ tablet, Take by mouth., Disp: , Rfl:  .  varenicline (CHANTIX) 1 MG tablet, Take 1 tablet (1 mg total) by mouth 2 (two) times daily. (Patient not taking: Reported on 04/07/2018), Disp: 60 tablet, Rfl: 0    Family History  Problem Relation Age of Onset  . Diabetes Mother   . Hypertension Mother   . Diabetes Father   . Hypertension Father      Social History   Tobacco Use  . Smoking status: Former Smoker    Packs/day: 0.50    Years: 7.00    Pack years: 3.50    Types: Cigarettes    Last attempt to quit: 10/19/2015    Years since quitting: 2.8  . Smokeless tobacco: Never Used  Substance Use Topics  . Alcohol use: Yes    Comment:  socially - 1x/mo  . Drug use: No    Allergies as of 08/18/2018 - Review Complete 08/18/2018  Allergen Reaction Noted  . Aspirin Other (See Comments) 08/04/2016  . Adhesive [tape] Rash 11/20/2015    Review of Systems:    All systems reviewed and negative except where noted in HPI.   Physical Exam:  BP (!) 198/131 (BP Location: Left Arm, Patient Position: Sitting, Cuff Size: Normal)   Pulse 87   Resp 17   Wt 142 lb (64.4 kg)   LMP  (LMP Unknown)   BMI 21.59 kg/m  No LMP recorded (lmp unknown). Patient has had a hysterectomy.  General:   Alert,  Thin built, moderately nourished and cooperative in NAD Head:  Normocephalic and atraumatic. Eyes:  Sclera clear, no icterus.   Conjunctiva pink. Ears:  Normal auditory acuity. Nose:  No deformity, discharge, or lesions. Mouth:  No deformity or lesions,oropharynx pink & moist. Neck:  Supple; no masses or thyromegaly. Lungs:  Respirations even and unlabored.  Clear throughout to auscultation.   No wheezes, crackles, or rhonchi. No acute distress. Heart:  Regular rate and  rhythm; no murmurs, clicks, rubs, or gallops. Abdomen:  Normal bowel sounds. Soft, non-tender and non-distended without masses, hepatosplenomegaly or hernias noted.  No guarding or rebound tenderness.   Rectal: Not performed Msk:  Symmetrical without gross deformities. Good, equal movement & strength bilaterally. Pulses:  Normal pulses noted. Extremities:  No clubbing or edema.  No cyanosis. Neurologic:  Alert and oriented x3;  grossly normal neurologically. Skin:  Intact without significant lesions or rashes. No jaundice. Lymph Nodes:  No significant cervical adenopathy. Psych:  Alert and cooperative. Normal mood and affect.  Imaging Studies: No recent abdominal imaging  Assessment and Plan:   Linda Rhodes is a 42 y.o. African-American female with chronic alcohol use, seen for follow-up of chronic upper abdominal pain, intermittent episodes of nausea, vomiting and dark stools. Her hemoglobin is stable.  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

## 2018-08-18 NOTE — ED Notes (Signed)
Patient ambulatory to restroom. Steady gait noted. Cardiac monitor reapplied. Patient resting comfortably in bed. No further needs expressed at this time.

## 2018-08-18 NOTE — ED Notes (Signed)
ED Provider at bedside. 

## 2018-08-18 NOTE — ED Triage Notes (Signed)
Pt arrives via ems from the GI clinic, pt reports headache for the past few days, pt was being seen at the clinic for vomiting, pt reports that she did take her metoprolol this am

## 2018-08-18 NOTE — ED Provider Notes (Signed)
[facility] Emergency Department Provider Note  ____________________________________________   First MD Initiated Contact with Patient 08/18/18 1032     (approximate)  I have reviewed the triage vital signs and the nursing notes.   HISTORY  Chief Complaint Headache and Hypertension   HPI Linda Rhodes is a 42 y.o. female a history of hypertension as well as migraine headaches was presented to the emergency department with a frontal headache as well as hypertension from the gastroenterology office.  Says that she was being seen in the gastroenterologist for issues of chronic vomiting.  Says that she was able to tolerate lunch yesterday but has been nauseous and has not eaten since then.  Says that her headache is been ongoing for 3 days and is an 8 out of 10 and frontal at this time.  Says that it is a throbbing type headache and associated with nausea as well as photophobia.  Says that she has had similar issues in the past with migraines.  Says that she takes metoprolol last took it this morning.  Was sent over from the gastroenterology office secondary to her hypertension as well as headache.  Patient denies any weakness or numbness at this time.  Says that she occasionally takes Flexeril and gabapentin but only as needed.   Past Medical History:  Diagnosis Date  . Abscessed tooth 09/24/2016   right lower tooth removed.  . Anemia    in past, prior to removal of uterine fibroids  . Bladder infection   . Difficult intubation   . GERD (gastroesophageal reflux disease)   . Headache    migrains  . Hypertension   . Motion sickness    all moving vehicles  . Ovarian cyst   . PONV (postoperative nausea and vomiting)     Patient Active Problem List   Diagnosis Date Noted  . Herpes zoster without complication 67/59/1638  . Elevated blood pressure reading 07/18/2018  . S/P hysterectomy 05/06/2015    Past Surgical History:  Procedure Laterality Date  .  ABDOMINAL HYSTERECTOMY    . BACK SURGERY  2016   [facility] Neurosurgery - ruptured L4-5  . CESAREAN SECTION    . CHOLECYSTECTOMY    . CYSTOSCOPY N/A 05/06/2015   Procedure: CYSTOSCOPY;  Surgeon: Aletha Halim, MD;  Location: [facility] ORS;  Service: Gynecology;  Laterality: N/A;  . ESOPHAGOGASTRODUODENOSCOPY (EGD) WITH PROPOFOL N/A 04/13/2018   Procedure: ESOPHAGOGASTRODUODENOSCOPY (EGD) WITH PROPOFOL;  Surgeon: Lin Landsman, MD;  Location: [facility] ENDOSCOPY;  Service: Gastroenterology;  Laterality: N/A;  . ETHMOIDECTOMY Right 11/25/2015   Procedure:  ANTERIOR ETHMOIDECTOMY;  Surgeon: Carloyn Manner, MD;  Location: [place];  Service: ENT;  Laterality: Right;  . FRONTAL SINUS EXPLORATION Right 11/25/2015   Procedure: FRONTAL SINUSOTOMY;  Surgeon: Carloyn Manner, MD;  Location: [place];  Service: ENT;  Laterality: Right;  . IMAGE GUIDED SINUS SURGERY N/A 11/25/2015   Procedure: IMAGE GUIDED SINUS SURGERY WITH BALLOON;  Surgeon: Carloyn Manner, MD;  Location: [place];  Service: ENT;  Laterality: N/A;  . LAPAROSCOPIC HYSTERECTOMY N/A 05/06/2015   Procedure: HYSTERECTOMY TOTAL LAPAROSCOPIC;  Surgeon: Aletha Halim, MD;  Location: [facility] ORS;  Service: Gynecology;  Laterality: N/A;  . LUMBAR LAMINECTOMY/DECOMPRESSION MICRODISCECTOMY N/A 10/04/2016   Procedure: right minimally invasive L5 S1 microdiscectomy;  Surgeon: Bayard Hugger, MD;  Location: [facility] ORS;  Service: Neurosurgery;  Laterality: N/A;  . MAXILLARY ANTROSTOMY Right 11/25/2015   Procedure: MAXILLARY ANTROSTOMY WITH TISSUE REMOVAL;  Surgeon: Carloyn Manner, MD;  Location: [place];  Service: ENT;  Laterality: Right;  . WISDOM TOOTH EXTRACTION  10/2115   3 teeth removed.    Prior to Admission medications   Medication Sig Start Date End Date Taking? Authorizing Provider  acetaminophen (TYLENOL) 500 MG tablet Take 1,000 mg by mouth every 6 (six) hours as needed for mild pain.    [provider]  cyclobenzaprine (FLEXERIL) 10 MG tablet TAKE 1 TABLET BY MOUTH THREE TIMES A DAY AS NEEDED FOR MUSCLE SPASMS 03/01/18   [provider]  cyclobenzaprine (FLEXERIL) 10 MG tablet Take by mouth. 06/08/18   [provider]  cyclobenzaprine (FLEXERIL) 5 MG tablet Take 5 mg by mouth 3 (three) times daily as needed for muscle spasms.    [provider]  famotidine (PEPCID) 20 MG tablet Take 1 tablet (20 mg total) by mouth 2 (two) times daily. 03/25/18   Carrie Mew, MD  gabapentin (NEURONTIN) 300 MG capsule Take 1 capsule (300 mg total) by mouth 3 (three) times daily as needed for up to 7 days. 09/23/17 07/18/18  Multani, Bhupinder, NP  gabapentin (NEURONTIN) 400 MG capsule Take by mouth. 12/17/17 12/17/18  [provider]  Menthol, Topical Analgesic, (BIOFREEZE EX) Apply 1 application topically 3 (three) times daily as needed (back pain). Alternates between [employer] and [employer], Historical, MD  metoprolol succinate (TOPROL-XL) 100 MG 24 hr tablet Take 25 mg by mouth daily. 08/04/16   [provider]  metoprolol succinate (TOPROL-XL) 25 MG 24 hr tablet Take by mouth.    [provider]  NICOTINE STEP 1 21 MG/24HR patch PLACE 1 PATCH (21 MG TOTAL) ONTO THE SKIN DAILY. Patient not taking: Reported on 04/07/2018 09/26/15   Juline Patch, MD  oxyCODONE (OXY IR/ROXICODONE) 5 MG immediate release tablet TAKE 1 TABLET (5 MG TOTAL) BY MOUTH EVERY 6 (SIX) HOURS AS NEEDED FOR PAIN 02/14/18   [provider]  pantoprazole (PROTONIX) 40 MG tablet Take 1 tablet (40 mg total) by mouth 2 (two) times daily before a meal. 08/18/18 11/16/18  Vanga, Tally Due, MD  potassium chloride (K-DUR,KLOR-CON) 10 MEQ tablet Take by mouth. 12/17/17 12/17/18  [provider]  valACYclovir (VALTREX) 1000 MG tablet Take 1 tablet (1,000 mg total) by mouth 3 (three) times daily. 24/2/68   Defelice, Jeanett Schlein, NP  varenicline (CHANTIX) 1 MG tablet  Take 1 tablet (1 mg total) by mouth 2 (two) times daily. Patient not taking: Reported on 04/07/2018 01/12/18   Juline Patch, MD    Allergies Aspirin and Adhesive [tape]  Family History  Problem Relation Age of Onset  . Diabetes Mother   . Hypertension Mother   . Diabetes Father   . Hypertension Father     Social History Social History   Tobacco Use  . Smoking status: Former Smoker    Packs/day: 0.50    Years: 7.00    Pack years: 3.50    Types: Cigarettes    Last attempt to quit: 10/19/2015    Years since quitting: 2.8  . Smokeless tobacco: Never Used  Substance Use Topics  . Alcohol use: Yes    Comment: socially - 1x/mo  . Drug use: No    Review of Systems  Constitutional: No fever/chills Eyes: As above ENT: No sore throat. Cardiovascular: Denies chest pain. Respiratory: Denies shortness of breath. Gastrointestinal: No abdominal pain.  no vomiting.  No diarrhea.  No constipation. Genitourinary: Negative for dysuria. Musculoskeletal: Negative for back pain. Skin:  Negative for rash. Neurological: Negative for focal weakness or numbness.   ____________________________________________   PHYSICAL EXAM:  VITAL SIGNS: ED Triage Vitals  Enc Vitals Group     BP 08/18/18 1027 (!) 187/116     Pulse Rate 08/18/18 1027 81     Resp 08/18/18 1027 16     Temp 08/18/18 1027 98.4 F (36.9 C)     Temp Source 08/18/18 1027 Oral     SpO2 08/18/18 1027 100 %     Weight 08/18/18 1028 142 lb (64.4 kg)     Height 08/18/18 1028 5\' 5"  (1.651 m)     Head Circumference --      Peak Flow --      Pain Score 08/18/18 1028 8     Pain Loc --      Pain Edu? --      Excl. in [place]? --     Constitutional: Alert and oriented.  Patient covering her eyes to avoid the fluorescent hospital lights.  Bright lights turned off so the patient can be more comfortable. Eyes: Conjunctivae are normal.  Head: Atraumatic. Nose: No congestion/rhinnorhea. Mouth/Throat: Mucous membranes are moist.    Neck: No stridor.   Cardiovascular: Normal rate, regular rhythm. Grossly normal heart sounds.   Respiratory: Normal respiratory effort.  No retractions. Lungs CTAB. Gastrointestinal: Soft and nontender. No distention.  Musculoskeletal: No lower extremity tenderness nor edema.  No joint effusions. Neurologic:  Normal speech and language. No gross focal neurologic deficits are appreciated. Skin:  Skin is warm, dry and intact. No rash noted. Psychiatric: Mood and affect are normal. Speech and behavior are normal.  ____________________________________________   LABS (all labs ordered are listed, but only abnormal results are displayed)  Labs Reviewed  BASIC METABOLIC PANEL - Abnormal; Notable for the following components:      Result Value   Potassium 3.4 (*)    Glucose, Bld 101 (*)    Calcium 8.7 (*)    All other components within normal limits  CBC WITH DIFFERENTIAL/PLATELET  TROPONIN I   ____________________________________________  EKG  ED ECG REPORT I, Doran Stabler, the attending physician, personally viewed and interpreted this ECG.   Date: 08/18/2018  EKG Time: 1100  Rate: 75  Rhythm: normal sinus rhythm  Axis: Normal  Intervals:none  ST&T Change: Diffuse and minimal ST elevation which is concave.  No reciprocal depressions.  T wave inversions in aVL.  ____________________________________________  RADIOLOGY  No acute finding on head CT. ____________________________________________   PROCEDURES  Procedure(s) performed:   Procedures  Critical Care performed:   ____________________________________________   INITIAL IMPRESSION / ASSESSMENT AND PLAN / ED COURSE  Pertinent labs & imaging results that were available during my care of the patient were reviewed by me and considered in my medical decision making (see chart for details).  Differential diagnosis includes, but is not limited to, intracranial hemorrhage, meningitis/encephalitis, previous  head trauma, cavernous venous thrombosis, tension headache, temporal arteritis, migraine or migraine equivalent, idiopathic intracranial hypertension, and non-specific headache. As part of my medical decision making, I reviewed the following data within the electronic MEDICAL RECORD NUMBER Notes from prior ED visits  ----------------------------------------- 12:29 PM on 08/18/2018 -----------------------------------------  Patient at this time without headache and nausea.  Tolerating p.o. fluids.  Blood pressure now at 129/84 after migraine treatment.  Reassuring imaging as well as lab results.  Reviewed lab results and imaging with the patient.  Patient to be discharged home at this time.  Likely migraine headache causing  hypertension.  Continues to be without neurologic complaint such as numbness or weakness. ____________________________________________   FINAL CLINICAL IMPRESSION(S) / ED DIAGNOSES  Headache.  Hypertension.  NEW MEDICATIONS STARTED DURING THIS VISIT:  New Prescriptions   No medications on file     Note:  This document was prepared using Dragon voice recognition software and may include unintentional dictation errors.     Orbie Pyo, MD 08/18/18 1230

## 2018-09-06 ENCOUNTER — Telehealth: Payer: Self-pay | Admitting: Family Medicine

## 2018-09-06 ENCOUNTER — Ambulatory Visit: Payer: BLUE CROSS/BLUE SHIELD | Admitting: Family Medicine

## 2018-09-06 ENCOUNTER — Encounter: Payer: Self-pay | Admitting: Family Medicine

## 2018-09-06 VITALS — BP 122/80 | HR 73 | Temp 98.0°F | Resp 16 | Ht 68.0 in | Wt 148.0 lb

## 2018-09-06 DIAGNOSIS — Z716 Tobacco abuse counseling: Secondary | ICD-10-CM

## 2018-09-06 DIAGNOSIS — Z9889 Other specified postprocedural states: Secondary | ICD-10-CM

## 2018-09-06 DIAGNOSIS — B029 Zoster without complications: Secondary | ICD-10-CM | POA: Diagnosis not present

## 2018-09-06 DIAGNOSIS — E876 Hypokalemia: Secondary | ICD-10-CM

## 2018-09-06 DIAGNOSIS — M5416 Radiculopathy, lumbar region: Secondary | ICD-10-CM | POA: Diagnosis not present

## 2018-09-06 DIAGNOSIS — Z1159 Encounter for screening for other viral diseases: Secondary | ICD-10-CM

## 2018-09-06 DIAGNOSIS — R030 Elevated blood-pressure reading, without diagnosis of hypertension: Secondary | ICD-10-CM | POA: Diagnosis not present

## 2018-09-06 DIAGNOSIS — Z114 Encounter for screening for human immunodeficiency virus [HIV]: Secondary | ICD-10-CM

## 2018-09-06 DIAGNOSIS — Z90710 Acquired absence of both cervix and uterus: Secondary | ICD-10-CM

## 2018-09-06 DIAGNOSIS — Z13220 Encounter for screening for lipoid disorders: Secondary | ICD-10-CM

## 2018-09-06 DIAGNOSIS — Z23 Encounter for immunization: Secondary | ICD-10-CM

## 2018-09-06 MED ORDER — VARENICLINE TARTRATE 0.5 MG X 11 & 1 MG X 42 PO MISC: ORAL | 0 refills | Status: DC

## 2018-09-06 MED ORDER — VARENICLINE TARTRATE 1 MG PO TABS: 1.0000 mg | ORAL_TABLET | Freq: Two times a day (BID) | ORAL | 2 refills | Status: DC

## 2018-09-06 NOTE — Patient Instructions (Signed)
Please call Dr. Marius Ditch to schedule follow up appointment.

## 2018-09-06 NOTE — Telephone Encounter (Signed)
Please contact patient's insurance and find out if she is able to obtain the shingles vaccine as she has had shingles multiple times.

## 2018-09-06 NOTE — Progress Notes (Signed)
Name: Linda Rhodes   MRN: [id number]    DOB: [birth year]   Date:09/06/2018       Progress Note  Subjective  Chief Complaint  Chief Complaint  Patient presents with  . Establish Care  . Hypertension    HPI  Pt presents to establish care and for the following:  HTN: She notes elevated BP readings at home and in ER on 08/18/2018.  Last night she had headache, checked her BP at home and it was elevated in the 190's.  She took 20mg  of her Dad's Lisinopril and she is now headache free and BP is at goal. Family history of strokes in mother and father. She has been taking 50mg  Metoprolol BID for a long time. She has had chronically low potassium - we will check adrenals today in the urine.  Tobacco Abuse: Needs Chantix Refill today - she does well on the medication - quit smoking over a year ago. She went off of the Chantix for several months, and is starting to have increased cravings   Chronic Back pain hx multiple back surgeries: Seeing Dr. Aris Lot, no scheduled follow up.  Taking flexeril and gabapentin PRN, though she is unsure of the dosing today.  She says she has good days and bad days, but overall is doing better after surgery.  GERD: Seeing Dr. Marius Ditch, taking pepcid and protonix, symtpoms are well controlled on this medication. Her last visit was November 1 - and EMS was called for elevated BP.  Her recent EGD was unremarkable and negative biopsy for H. Pylori. Dr. Marius Ditch did recommend CT abdomen for pancreas eval. ?Possible ETOH abuse related issues.  She drinks a 12 pack of cider in a week.  She will call to reschedule appointment.  Hx Shingles: She has had shingles several times in the past, she was prescribed valtrex, but is not taking at this time.  She notes that it occurs when she becomes stressed out. She has not had vaccination  Patient Active Problem List   Diagnosis Date Noted  . Herpes zoster without complication 50/53/9767  . Elevated blood pressure reading 07/18/2018  .  Radiculopathy of lumbar region 12/15/2017  . Status post lumbar laminectomy 12/15/2017  . Intractable chronic migraine without aura and without status migrainosus 08/04/2016  . S/P hysterectomy 05/06/2015    Past Surgical History:  Procedure Laterality Date  . ABDOMINAL HYSTERECTOMY    . BACK SURGERY  2016   [facility] Neurosurgery - ruptured L4-5  . CESAREAN SECTION    . CHOLECYSTECTOMY    . CYSTOSCOPY N/A 05/06/2015   Procedure: CYSTOSCOPY;  Surgeon: Aletha Halim, MD;  Location: [facility] ORS;  Service: Gynecology;  Laterality: N/A;  . ESOPHAGOGASTRODUODENOSCOPY (EGD) WITH PROPOFOL N/A 04/13/2018   Procedure: ESOPHAGOGASTRODUODENOSCOPY (EGD) WITH PROPOFOL;  Surgeon: Lin Landsman, MD;  Location: [facility] END[facility]COPY;  Service: Gastroenterology;  Laterality: N/A;  . ETHMOIDECTOMY Right 11/25/2015   Procedure:  ANTERIOR ETHMOIDECTOMY;  Surgeon: Carloyn Manner, MD;  Location: [place];  Service: ENT;  Laterality: Right;  . FRONTAL SINUS EXPLORATION Right 11/25/2015   Procedure: FRONTAL SINUSOTOMY;  Surgeon: Carloyn Manner, MD;  Location: [place];  Service: ENT;  Laterality: Right;  . IMAGE GUIDED SINUS SURGERY N/A 11/25/2015   Procedure: IMAGE GUIDED SINUS SURGERY WITH BALLOON;  Surgeon: Carloyn Manner, MD;  Location: [place];  Service: ENT;  Laterality: N/A;  . LAPAROSCOPIC HYSTERECTOMY N/A 05/06/2015   Procedure: HYSTERECTOMY TOTAL LAPAROSCOPIC;  Surgeon: Aletha Halim, MD;  Location: [facility] OR[facility];  Service: Gynecology;  Laterality: N/A;  . LUMBAR LAMINECTOMY/DECOMPRESSION MICRODISCECTOMY N/A 10/04/2016   Procedure: right minimally invasive L5 S1 microdiscectomy;  Surgeon: Bayard Hugger, MD;  Location: [facility] OR[facility];  Service: Neurosurgery;  Laterality: N/A;  . MAXILLARY ANTROSTOMY Right 11/25/2015   Procedure: MAXILLARY ANTROSTOMY WITH TISSUE REMOVAL;  Surgeon: Carloyn Manner, MD;  Location: [place];  Service: ENT;  Laterality: Right;  . WISDOM TOOTH  EXTRACTION  10/2115   3 teeth removed.    Family History  Problem Relation Age of Onset  . Diabetes Mother   . Hypertension Mother   . Diabetes Father   . Hypertension Father     Social History   Socioeconomic History  . Marital status: Single    Spouse name: Not on file  . Number of children: Not on file  . Years of education: Not on file  . Highest education level: Not on file  Occupational History  . Not on file  Social Needs  . Financial resource strain: Not hard at all  . Food insecurity:    Worry: Never true    Inability: Never true  . Transportation needs:    Medical: No    Non-medical: No  Tobacco Use  . Smoking status: Former Smoker    Packs/day: 0.50    Years: 7.00    Pack years: 3.50    Types: Cigarettes    Last attempt to quit: 10/19/2015    Years since quitting: 2.8  . Smokeless tobacco: Never Used  Substance and Sexual Activity  . Alcohol use: Yes    Comment: socially - 1x/mo  . Drug use: No  . Sexual activity: Yes  Lifestyle  . Physical activity:    Days per week: 0 days    Minutes per session: 0 min  . Stress: Not at all  Relationships  . Social connections:    Talks on phone: More than three times a week    Gets together: More than three times a week    Attends religious service: More than 4 times per year    Active member of club or organization: No    Attends meetings of clubs or organizations: Never    Relationship status: Never married  . Intimate partner violence:    Fear of current or ex partner: No    Emotionally abused: No    Physically abused: No    Forced sexual activity: No  Other Topics Concern  . Not on file  Social History Narrative  . Not on file     Current Outpatient Medications:  .  acetaminophen (TYLENOL) 500 MG tablet, Take 1,000 mg by mouth every 6 (six) hours as needed for mild pain., Disp: , Rfl:  .  famotidine (PEPCID) 20 MG tablet, Take 1 tablet (20 mg total) by mouth 2 (two) times daily., Disp: 60 tablet,  Rfl: 0 .  metoprolol succinate (TOPROL-XL) 50 MG 24 hr tablet, Take 50 mg by mouth 2 (two) times daily. Take with or immediately following a meal., Disp: , Rfl:  .  pantoprazole (PROTONIX) 40 MG tablet, Take 1 tablet (40 mg total) by mouth 2 (two) times daily before a meal., Disp: 180 tablet, Rfl: 0 .  cyclobenzaprine (FLEXERIL) 10 MG tablet, Take by mouth., Disp: , Rfl:  .  cyclobenzaprine (FLEXERIL) 5 MG tablet, Take 5 mg by mouth 3 (three) times daily as needed for muscle spasms., Disp: , Rfl:  .  gabapentin (NEURONTIN) 300 MG capsule, Take 1 capsule (300 mg total)  by mouth 3 (three) times daily as needed for up to 7 days., Disp: 21 capsule, Rfl: 9 .  gabapentin (NEURONTIN) 400 MG capsule, Take by mouth., Disp: , Rfl:  .  Menthol, Topical Analgesic, (BIOFREEZE EX), Apply 1 application topically 3 (three) times daily as needed (back pain). Alternates between [employer] and [employer], Disp: , Rfl:  .  oxyCODONE (OXY IR/ROXICODONE) 5 MG immediate release tablet, TAKE 1 TABLET (5 MG TOTAL) BY MOUTH EVERY 6 (SIX) HOURS AS NEEDED FOR PAIN, Disp: , Rfl: 0 .  potassium chloride (K-DUR,KLOR-CON) 10 MEQ tablet, Take by mouth., Disp: , Rfl:  .  varenicline (CHANTIX STARTING MONTH PAK) 0.5 MG X 11 & 1 MG X 42 tablet, Take one 0.5 mg tablet by mouth once daily for 3 days, then increase to one 0.5 mg tablet twice daily for 4 days, then increase to one 1 mg tablet twice daily., Disp: 53 tablet, Rfl: 0 .  varenicline (CHANTIX) 1 MG tablet, Take 1 tablet (1 mg total) by mouth 2 (two) times daily., Disp: 60 tablet, Rfl: 2  Allergies  Allergen Reactions  . Aspirin Other (See Comments)    Causes burning feeling in her stomach.   . Adhesive [Tape] Rash    And skin tears from stronger tapes   I personally reviewed active problem list, medication list, allergies, family history, social history, health maintenance, notes from last encounter, lab results with the patient/caregiver today.   ROS  Constitutional:  Negative for fever or weight change.  Respiratory: Negative for cough and shortness of breath.   Cardiovascular: Negative for chest pain or palpitations.  Gastrointestinal: Negative for abdominal pain, no bowel changes.  Musculoskeletal: Negative for gait problem or joint swelling.  Skin: Negative for rash.  Neurological: Negative for dizziness or headache.  No other specific complaints in a complete review of systems (except as listed in HPI above).  Objective  Vitals:   09/06/18 0918  BP: 122/80  Pulse: 73  Resp: 16  Temp: 98 F (36.7 C)  TempSrc: Oral  SpO2: 99%  Weight: 148 lb (67.1 kg)  Height: 5\' 8"  (1.727 m)   Body mass index is 22.5 kg/m.  Physical Exam  Constitutional: Patient appears well-developed and well-nourished. No distress.  HENT: Head: Normocephalic and atraumatic. Ears: bilateral TMs with no erythema or effusion; Nose: Nose normal. Mouth/Throat: Oropharynx is clear and moist. No oropharyngeal exudate or tonsillar swelling.  Eyes: Conjunctivae and EOM are normal. No scleral icterus.  Pupils are equal, round, and reactive to light.  Neck: Normal range of motion. Neck supple. No JVD present. No thyromegaly present.  Cardiovascular: Normal rate, regular rhythm and normal heart sounds.  No murmur heard. No BLE edema. Pulmonary/Chest: Effort normal and breath sounds normal. No respiratory distress. Abdominal: Soft. Bowel sounds are normal, no distension. There is no tenderness. No masses. Musculoskeletal: Normal range of motion, no joint effusions. No gross deformities Neurological: Pt is alert and oriented to person, place, and time. No cranial nerve deficit. Coordination, balance, strength, speech and gait are normal.  Skin: Skin is warm and dry. No rash noted. No erythema.  Psychiatric: Patient has a normal mood and affect. behavior is normal. Judgment and thought content normal.  No results found for this or any previous visit (from the past 72  hour(s)).  PHQ2/9: Depression screen [facility] 2/9 09/06/2018 04/28/2016 11/13/2015  Decreased Interest 0 0 0  Down, Depressed, Hopeless 0 0 0  PHQ - 2 Score 0 0 0  Altered sleeping  0 - -  Tired, decreased energy 0 - -  Change in appetite 0 - -  Feeling bad or failure about yourself  0 - -  Moving slowly or fidgety/restless 0 - -  Suicidal thoughts 0 - -  PHQ-9 Score 0 - -  Difficult doing work/chores Not difficult at all - -   Fall Risk: Fall Risk  09/06/2018 04/28/2016 11/13/2015  Falls in the past year? 0 No No  Number falls in past yr: 0 - -  Injury with Fall? 0 - -   Assessment & Plan  1. Elevated blood pressure reading - DASH diet discussed; has ongoing hypokelemia, so we will eval for adrenal adenoma; BP is at goal today, so no additional medication is indicated, will follow up in 2 weeks to recheck.   - COMPLETE METABOLIC PANEL WITH GFR - Lipid panel - Aldosterone + renin activity w/ ratio - Magnesium - Urine Microalbumin w/creat. ratio  2. Radiculopathy of lumbar region 3. Status post lumbar laminectomy Stable  4. Herpes zoster without complication - Stable at this time; will reach out to insurance company to find out about early vacciantion  5. S/P hysterectomy - Stable  6. Tobacco abuse counseling - varenicline (CHANTIX) 1 MG tablet; Take 1 tablet (1 mg total) by mouth 2 (two) times daily.  Dispense: 60 tablet; Refill: 2 - varenicline (CHANTIX STARTING MONTH PAK) 0.5 MG X 11 & 1 MG X 42 tablet; Take one 0.5 mg tablet by mouth once daily for 3 days, then increase to one 0.5 mg tablet twice daily for 4 days, then increase to one 1 mg tablet twice daily.  Dispense: 53 tablet; Refill: 0  7. Need for hepatitis C screening test - Hepatitis C antibody  8. Encounter for screening for HIV - HIV Antibody (routine testing w rflx)  9. Lipid screening - Lipid panel  10. Needs flu shot - Flu Vaccine QUAD 6+ mos PF IM (Fluarix Quad PF)  11. Hypokalemia - Aldosterone +  renin activity w/ ratio - Magnesium - Urine Microalbumin w/creat. ratio

## 2018-09-11 LAB — COMPLETE METABOLIC PANEL WITH GFR
AG Ratio: 1.5 (calc) (ref 1.0–2.5)
ALBUMIN MSPROF: 4.4 g/dL (ref 3.6–5.1)
ALT: 7 U/L (ref 6–29)
AST: 15 U/L (ref 10–30)
Alkaline phosphatase (APISO): 66 U/L (ref 33–115)
BUN: 9 mg/dL (ref 7–25)
CALCIUM: 9.1 mg/dL (ref 8.6–10.2)
CO2: 29 mmol/L (ref 20–32)
CREATININE: 0.99 mg/dL (ref 0.50–1.10)
Chloride: 100 mmol/L (ref 98–110)
GFR, EST AFRICAN AMERICAN: 83 mL/min/{1.73_m2} (ref >=60)
GFR, EST NON AFRICAN AMERICAN: 71 mL/min/{1.73_m2} (ref >=60)
GLOBULIN: 2.9 g/dL (ref 1.9–3.7)
Glucose, Bld: 89 mg/dL (ref 65–99)
Potassium: 3.7 mmol/L (ref 3.5–5.3)
SODIUM: 135 mmol/L (ref 135–146)
TOTAL PROTEIN: 7.3 g/dL (ref 6.1–8.1)
Total Bilirubin: 0.8 mg/dL (ref 0.2–1.2)

## 2018-09-11 LAB — ALDOSTERONE + RENIN ACTIVITY W/ RATIO
ALDO / PRA RATIO: 216.7 ratio — AB (ref 0.9–28.9)
Aldosterone: 13 ng/dL
Renin Activity: 0.06 ng/mL/h — ABNORMAL LOW (ref 0.25–5.82)

## 2018-09-11 LAB — MICROALBUMIN / CREATININE URINE RATIO
Creatinine, Urine: 59 mg/dL (ref 20–275)
MICROALB UR: 0.3 mg/dL
Microalb Creat Ratio: 5 mcg/mg creat (ref <30)

## 2018-09-11 LAB — LIPID PANEL
Cholesterol: 127 mg/dL (ref <200)
HDL: 42 mg/dL — ABNORMAL LOW (ref >50)
LDL CHOLESTEROL (CALC): 61 mg/dL
NON-HDL CHOLESTEROL (CALC): 85 mg/dL (ref <130)
Total CHOL/HDL Ratio: 3 (calc) (ref <5.0)
Triglycerides: 154 mg/dL — ABNORMAL HIGH (ref <150)

## 2018-09-11 LAB — MAGNESIUM: Magnesium: 1.9 mg/dL (ref 1.5–2.5)

## 2018-09-11 LAB — HIV ANTIBODY (ROUTINE TESTING W REFLEX): HIV 1&2 Ab, 4th Generation: NONREACTIVE

## 2018-09-11 LAB — HEPATITIS C ANTIBODY
Hepatitis C Ab: NONREACTIVE
SIGNAL TO CUT-OFF: 0.02 (ref <1.00)

## 2018-09-12 ENCOUNTER — Other Ambulatory Visit: Payer: Self-pay | Admitting: Family Medicine

## 2018-09-13 ENCOUNTER — Other Ambulatory Visit: Payer: Self-pay | Admitting: Family Medicine

## 2018-09-13 ENCOUNTER — Telehealth: Payer: Self-pay | Admitting: Family Medicine

## 2018-09-13 DIAGNOSIS — R799 Abnormal finding of blood chemistry, unspecified: Principal | ICD-10-CM

## 2018-09-13 DIAGNOSIS — I10 Essential (primary) hypertension: Secondary | ICD-10-CM

## 2018-09-13 DIAGNOSIS — R7989 Other specified abnormal findings of blood chemistry: Secondary | ICD-10-CM

## 2018-09-13 DIAGNOSIS — E876 Hypokalemia: Secondary | ICD-10-CM

## 2018-09-13 MED ORDER — SPIRONOLACTONE 25 MG PO TABS: 25.0000 mg | ORAL_TABLET | Freq: Every day | ORAL | 0 refills | Status: DC

## 2018-09-13 NOTE — Telephone Encounter (Signed)
Copied from [place] [id number]. Topic: General - Other >> Sep 13, 2018 11:03 AM Yvette Rack wrote: Reason for CRM: pt calling for lab results

## 2018-09-13 NOTE — Telephone Encounter (Signed)
Charted in result notes. 

## 2018-09-18 ENCOUNTER — Telehealth: Payer: Self-pay | Admitting: Family Medicine

## 2018-09-18 NOTE — Telephone Encounter (Signed)
Copied from [place] [id number]. Topic: Quick Communication - See Telephone Encounter >> Sep 18, 2018  3:31 PM Nils Flack wrote: CRM for notification. See Telephone encounter for: 09/18/18. [facility] declined authorization for CT.  Please let me know what you are trying to rule out.

## 2018-09-19 NOTE — Telephone Encounter (Signed)
This is a rule out adrenal adenoma - she had abnormal aldosterone renin ratio testing, has history of fluctuating BP's/HTN and hypokalemia.

## 2018-09-21 ENCOUNTER — Ambulatory Visit (INDEPENDENT_AMBULATORY_CARE_PROVIDER_SITE_OTHER): Payer: BLUE CROSS/BLUE SHIELD

## 2018-09-21 DIAGNOSIS — I10 Essential (primary) hypertension: Secondary | ICD-10-CM

## 2018-09-21 DIAGNOSIS — Z23 Encounter for immunization: Secondary | ICD-10-CM | POA: Diagnosis not present

## 2018-09-21 DIAGNOSIS — R799 Abnormal finding of blood chemistry, unspecified: Secondary | ICD-10-CM

## 2018-09-21 DIAGNOSIS — E876 Hypokalemia: Secondary | ICD-10-CM

## 2018-09-21 DIAGNOSIS — R7989 Other specified abnormal findings of blood chemistry: Secondary | ICD-10-CM

## 2018-09-21 NOTE — Telephone Encounter (Signed)
Patient notified

## 2018-09-22 NOTE — Telephone Encounter (Signed)
Insurance has approved ct.  I have scheduled, and am getting in contact with pt.

## 2018-09-22 NOTE — Telephone Encounter (Signed)
Thank you so much

## 2018-09-29 ENCOUNTER — Ambulatory Visit: Admission: RE | Admit: 2018-09-29 | Payer: BLUE CROSS/BLUE SHIELD | Source: Ambulatory Visit

## 2018-09-29 ENCOUNTER — Ambulatory Visit
Admission: RE | Admit: 2018-09-29 | Discharge: 2018-09-29 | Disposition: A | Discharge status: Home / Self Care | Payer: BLUE CROSS/BLUE SHIELD | Source: Ambulatory Visit | Attending: Family Medicine | Admitting: Family Medicine

## 2018-09-29 DIAGNOSIS — R7989 Other specified abnormal findings of blood chemistry: Secondary | ICD-10-CM

## 2018-09-29 DIAGNOSIS — R799 Abnormal finding of blood chemistry, unspecified: Secondary | ICD-10-CM | POA: Diagnosis present

## 2018-09-29 DIAGNOSIS — I10 Essential (primary) hypertension: Secondary | ICD-10-CM | POA: Insufficient documentation

## 2018-09-29 DIAGNOSIS — E876 Hypokalemia: Secondary | ICD-10-CM | POA: Diagnosis present

## 2018-10-04 ENCOUNTER — Encounter: Payer: BLUE CROSS/BLUE SHIELD | Admitting: Family Medicine

## 2018-10-04 ENCOUNTER — Other Ambulatory Visit: Payer: Self-pay | Admitting: Family Medicine

## 2018-10-04 DIAGNOSIS — R7989 Other specified abnormal findings of blood chemistry: Secondary | ICD-10-CM

## 2018-10-04 DIAGNOSIS — I10 Essential (primary) hypertension: Secondary | ICD-10-CM

## 2018-10-04 DIAGNOSIS — R799 Abnormal finding of blood chemistry, unspecified: Secondary | ICD-10-CM

## 2018-10-04 DIAGNOSIS — E876 Hypokalemia: Secondary | ICD-10-CM

## 2018-10-05 ENCOUNTER — Encounter: Payer: Self-pay | Admitting: General Surgery

## 2018-10-05 ENCOUNTER — Other Ambulatory Visit: Payer: Self-pay

## 2018-10-05 ENCOUNTER — Ambulatory Visit (INDEPENDENT_AMBULATORY_CARE_PROVIDER_SITE_OTHER): Payer: BLUE CROSS/BLUE SHIELD | Admitting: General Surgery

## 2018-10-05 VITALS — BP 161/100 | HR 91 | Temp 97.3°F | Resp 16 | Ht 68.0 in | Wt 140.0 lb

## 2018-10-05 DIAGNOSIS — E2609 Other primary hyperaldosteronism: Secondary | ICD-10-CM

## 2018-10-05 MED ORDER — SPIRONOLACTONE 100 MG PO TABS: 100.0000 mg | ORAL_TABLET | Freq: Two times a day (BID) | ORAL | 3 refills | Status: DC

## 2018-10-05 NOTE — Patient Instructions (Signed)
Please call our office if you have concerns or questions.

## 2018-10-06 DIAGNOSIS — E2609 Other primary hyperaldosteronism: Secondary | ICD-10-CM | POA: Insufficient documentation

## 2018-10-06 NOTE — Progress Notes (Signed)
Patient ID: Linda Rhodes, female   DOB: 1976-07-11, 42 y.o.   MRN: [id number]  Chief Complaint  Patient presents with  . New Patient (Initial Visit)    abnormal aldosterone    HPI Linda Rhodes is a 42 y.o. female.   HPI She was referred for evaluation by Linda Ensign, FNP, for further evaluation of elevated aldosterone:renin ratio.  Linda Rhodes has a history of severe intractable migraines as well as elevated blood pressure that has been somewhat challenging to control.  As part of her evaluation, Linda Rhodes tested aldosterone and renin.  Patient's aldosterone to renin ratio was found to be elevated at 216.7.  In addition her serum potassium level was found to be low.  This raised concern for possible hyperaldosteronism.  Linda Rhodes contacted me for my recommendations.  I suggested initiating spironolactone and discontinuing potassium supplementation.  I also recommended that an adrenal protocol CT scan be performed.  Linda Rhodes is here today to discuss these results and make further plans for treatment.  Today she is somewhat tearful and complains of a headache.  She also reports having had blurry vision off and on.  She states that she has been taking the spironolactone as prescribed and her potassium supplementation was stopped.  Past Medical History:  Diagnosis Date  . Abscessed tooth 09/24/2016   right lower tooth removed.  . Anemia    in past, prior to removal of uterine fibroids  . Bladder infection   . Difficult intubation   . GERD (gastroesophageal reflux disease)   . Headache    migrains  . Hypertension   . Motion sickness    all moving vehicles  . Ovarian cyst   . PONV (postoperative nausea and vomiting)     Past Surgical History:  Procedure Laterality Date  . ABDOMINAL HYSTERECTOMY    . BACK SURGERY  2016   [facility] Neurosurgery - ruptured L4-5  . CESAREAN SECTION    . CHOLECYSTECTOMY    . CYSTOSCOPY N/A 05/06/2015   Procedure: CYSTOSCOPY;  Surgeon: Aletha Halim, MD;   Location: [facility] ORS;  Service: Gynecology;  Laterality: N/A;  . ESOPHAGOGASTRODUODENOSCOPY (EGD) WITH PROPOFOL N/A 04/13/2018   Procedure: ESOPHAGOGASTRODUODENOSCOPY (EGD) WITH PROPOFOL;  Surgeon: Lin Landsman, MD;  Location: [facility] ENDOSCOPY;  Service: Gastroenterology;  Laterality: N/A;  . ETHMOIDECTOMY Right 11/25/2015   Procedure:  ANTERIOR ETHMOIDECTOMY;  Surgeon: Carloyn Manner, MD;  Location: [place];  Service: ENT;  Laterality: Right;  . FRONTAL SINUS EXPLORATION Right 11/25/2015   Procedure: FRONTAL SINUSOTOMY;  Surgeon: Carloyn Manner, MD;  Location: [place];  Service: ENT;  Laterality: Right;  . IMAGE GUIDED SINUS SURGERY N/A 11/25/2015   Procedure: IMAGE GUIDED SINUS SURGERY WITH BALLOON;  Surgeon: Carloyn Manner, MD;  Location: [place];  Service: ENT;  Laterality: N/A;  . LAPAROSCOPIC HYSTERECTOMY N/A 05/06/2015   Procedure: HYSTERECTOMY TOTAL LAPAROSCOPIC;  Surgeon: Aletha Halim, MD;  Location: [facility]S;  Service: Gynecology;  Laterality: N/A;  . LUMBAR LAMINECTOMY/DECOMPRESSION MICRODISCECTOMY N/A 10/04/2016   Procedure: right minimally invasive L5 S1 microdiscectomy;  Surgeon: Bayard Hugger, MD;  Location: [facility];  Service: Neurosurgery;  Laterality: N/A;  . MAXILLARY ANTROSTOMY Right 11/25/2015   Procedure: MAXILLARY ANTROSTOMY WITH TISSUE REMOVAL;  Surgeon: Carloyn Manner, MD;  Location: [place];  Service: ENT;  Laterality: Right;  . WISDOM TOOTH EXTRACTION  10/2115   3 teeth removed.    Family History  Problem Relation Age of Onset  . Diabetes Mother   .  Hypertension Mother   . Diabetes Father   . Hypertension Father     Social History Social History   Tobacco Use  . Smoking status: Former Smoker    Packs/day: 0.50    Years: 7.00    Pack years: 3.50    Types: Cigarettes    Last attempt to quit: 10/19/2015    Years since quitting: 2.9  . Smokeless tobacco: Never Used  Substance Use Topics  . Alcohol use: Yes     Comment: socially - 1x/mo  . Drug use: No    Allergies  Allergen Reactions  . Aspirin Other (See Comments)    Causes burning feeling in her stomach.   . Adhesive [Tape] Rash    And skin tears from stronger tapes    Current Outpatient Medications  Medication Sig Dispense Refill  . acetaminophen (TYLENOL) 500 MG tablet Take 1,000 mg by mouth every 6 (six) hours as needed for mild pain.    . cyclobenzaprine (FLEXERIL) 5 MG tablet Take 5 mg by mouth 3 (three) times daily as needed for muscle spasms.    . famotidine (PEPCID) 20 MG tablet Take 1 tablet (20 mg total) by mouth 2 (two) times daily. 60 tablet 0  . metoprolol succinate (TOPROL-XL) 50 MG 24 hr tablet Take 50 mg by mouth 2 (two) times daily. Take with or immediately following a meal.    . pantoprazole (PROTONIX) 40 MG tablet Take 1 tablet (40 mg total) by mouth 2 (two) times daily before a meal. 180 tablet 0  . varenicline (CHANTIX) 1 MG tablet Take 1 tablet (1 mg total) by mouth 2 (two) times daily. 60 tablet 2  . gabapentin (NEURONTIN) 300 MG capsule Take 1 capsule (300 mg total) by mouth 3 (three) times daily as needed for up to 7 days. 21 capsule 9  . spironolactone (ALDACTONE) 100 MG tablet Take 1 tablet (100 mg total) by mouth 2 (two) times daily. 60 tablet 3   No current facility-administered medications for this visit.     Review of Systems Review of Systems  Neurological: Positive for headaches.  Psychiatric/Behavioral: Positive for dysphoric mood.  All other systems reviewed and are negative.   Blood pressure (!) 161/100, pulse 91, temperature (!) 97.3 F (36.3 C), temperature source Oral, resp. rate 16, height 5\' 8"  (1.727 m), weight 140 lb (63.5 kg), SpO2 100 %.  Physical Exam Physical Exam Vitals signs and nursing note reviewed. Exam conducted with a chaperone present.  Constitutional:      General: She is not in acute distress.    Comments: tearful  HENT:     Head: Normocephalic and atraumatic.      Mouth/Throat:     Mouth: Mucous membranes are moist.     Pharynx: Oropharynx is clear. No oropharyngeal exudate or posterior oropharyngeal erythema.  Eyes:     General: No scleral icterus.       Right eye: No discharge.        Left eye: No discharge.     Pupils: Pupils are equal, round, and reactive to light.  Neck:     Musculoskeletal: Normal range of motion and neck supple. No muscular tenderness.     Thyroid: No thyromegaly.  Cardiovascular:     Rate and Rhythm: Normal rate.     Pulses: Normal pulses.     Heart sounds: Normal heart sounds.  Pulmonary:     Effort: Pulmonary effort is normal.     Breath sounds: Normal breath sounds.  Abdominal:  General: Abdomen is flat. There is no distension.     Palpations: Abdomen is soft.     Tenderness: There is no abdominal tenderness.  Musculoskeletal:        General: No swelling or tenderness.  Lymphadenopathy:     Cervical: No cervical adenopathy.  Skin:    General: Skin is warm and dry.     Findings: No rash.  Neurological:     General: No focal deficit present.     Mental Status: She is alert.  Psychiatric:        Thought Content: Thought content normal.        Judgment: Judgment normal.     Data Reviewed Results for AYSHIA, GRAMLICH" (MRN [id number]) as of 10/06/2018 10:32  Ref. Range 03/25/2018 16:05 08/18/2018 11:17 09/06/2018 10:16  Sodium Latest Ref Range: 135 - 146 mmol/L 139 138 135  Potassium Latest Ref Range: 3.5 - 5.3 mmol/L 3.5 3.4 (L) 3.7  Chloride Latest Ref Range: 98 - 110 mmol/L 103 101 100  CO2 Latest Ref Range: 20 - 32 mmol/L 25 26 29   Glucose Latest Ref Range: 65 - 99 mg/dL 113 (H) 101 (H) 89  BUN Latest Ref Range: 7 - 25 mg/dL 9 12 9   Creatinine Latest Ref Range: 0.50 - 1.10 mg/dL 0.78 0.81 0.99  Calcium Latest Ref Range: 8.6 - 10.2 mg/dL 8.3 (L) 8.7 (L) 9.1  Anion gap Latest Ref Range: 5 - 15  11 11    BUN/Creatinine Ratio Latest Ref Range: 6 - 22 (calc)   NOT APPLICABLE  Magnesium Latest Ref  Range: 1.5 - 2.5 mg/dL   1.9  Alkaline Phosphatase Latest Ref Range: 38 - 126 U/L 71    Albumin Latest Ref Range: 3.5 - 5.0 g/dL 4.4    AG Ratio Latest Ref Range: 1.0 - 2.5 (calc)   1.5  AST Latest Ref Range: 10 - 30 U/L 26  15  ALT Latest Ref Range: 6 - 29 U/L 14  7  Total Protein Latest Ref Range: 6.1 - 8.1 g/dL 8.1  7.3  Total Bilirubin Latest Ref Range: 0.2 - 1.2 mg/dL 0.9  0.8  GFR, Est Non African American Latest Ref Range: > OR = 60 mL/min/1.53m2 >60 >60 71  GFR, Est African American Latest Ref Range: > OR = 60 mL/min/1.66m2 >60 >60 83   Results for Stegmaier, Kasara R "MANDY" (MRN [id number]) as of 10/06/2018 10:32  Ref. Range 09/06/2018 10:16  ALDOSTERONE Latest Ref Range:  ng/dL 13  Renin Activity Latest Ref Range: 0.25 - 5.82 ng/mL/h 0.06 (L)  ALDO / PRA Ratio Latest Ref Range: 0.9 - 28.9 Ratio 216.7 (H)    CLINICAL DATA:  Abnormal aldosterone to renin ratio.  EXAM: CT ABDOMEN WITHOUT CONTRAST  TECHNIQUE: Multidetector CT imaging of the abdomen was performed following the standard protocol without IV contrast.  COMPARISON:  CT scan of August 20, 2011.  FINDINGS: Lower chest: No acute abnormality.  Hepatobiliary: No focal liver abnormality is seen. Status post cholecystectomy. No biliary dilatation.  Pancreas: Unremarkable. No pancreatic ductal dilatation or surrounding inflammatory changes.  Spleen: Normal in size without focal abnormality.  Adrenals/Urinary Tract: Adrenal glands are unremarkable. Kidneys are normal, without renal calculi, focal lesion, or hydronephrosis.  Stomach/Bowel: The stomach and visualized bowel loops are unremarkable.  Vascular/Lymphatic: No significant vascular findings are present. No enlarged abdominal or pelvic lymph nodes.  Other: No abdominal wall hernia or abnormality.  Musculoskeletal: No acute or significant osseous findings.  IMPRESSION: Adrenal glands are  unremarkable. No definite abnormality is seen  in the abdomen on these unenhanced images.  Assessment    Tylisa Alcivar is a 42 year old woman with chronic headaches and elevated blood pressure.  She was found to have low potassium and subsequent evaluation revealed an elevated aldosterone to renin ratio.  Unfortunately the CT scan was performed without contrast limiting its usefulness in assessing the adrenal glands.  There are no obvious adenomas or masses appreciated.  On my personal review of the imaging both glands appear somewhat thickened, but again, this is limited by the lack of intravenous contrast used.  Based upon the imaging I do not believe she has a true Conn syndrome.  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  This is not treated surgically.  This can be treated, however, with escalating doses of spironolactone.  She is only on a very small dose (25 mg daily).    Plan    I have increased her spironolactone dose markedly, to 100 mg twice daily.  I believe her blood pressure will tolerate this however, she was cautioned to contact either myself or Linda Rhodes if she experiences orthostatic hypotension symptoms.  She is scheduled to see Linda Rhodes for a follow-up visit on 26th December.  At that visit I would recommend rechecking a potassium level.  The dose of Spironolactone may be titrated upwards or downwards depending on her tolerance and blood pressure response.  At this time she does not have any surgical indications and I have not scheduled her for return visit.  She is of course welcome to return at any time should questions or concerns arise.       Fredirick Maudlin 10/06/2018, 10:31 AM

## 2018-10-12 ENCOUNTER — Encounter: Payer: BLUE CROSS/BLUE SHIELD | Admitting: Family Medicine

## 2018-11-03 ENCOUNTER — Encounter: Payer: Self-pay | Admitting: Nurse Practitioner

## 2018-11-03 ENCOUNTER — Ambulatory Visit: Payer: BLUE CROSS/BLUE SHIELD | Admitting: Nurse Practitioner

## 2018-11-03 ENCOUNTER — Ambulatory Visit (INDEPENDENT_AMBULATORY_CARE_PROVIDER_SITE_OTHER): Payer: BLUE CROSS/BLUE SHIELD | Admitting: Nurse Practitioner

## 2018-11-03 VITALS — BP 118/74 | HR 99 | Temp 98.4°F | Resp 16 | Ht 67.0 in | Wt 142.5 lb

## 2018-11-03 DIAGNOSIS — B029 Zoster without complications: Secondary | ICD-10-CM | POA: Diagnosis not present

## 2018-11-03 MED ORDER — VALACYCLOVIR HCL 1 G PO TABS: 1000.0000 mg | ORAL_TABLET | Freq: Three times a day (TID) | ORAL | 0 refills | Status: DC

## 2018-11-03 NOTE — Patient Instructions (Signed)
Shingles    Shingles is an infection. It gives you a painful skin rash and blisters that have fluid in them. Shingles is caused by the same germ (virus) that causes chickenpox.  Shingles only happens in people who:   Have had chickenpox.   Have been given a shot of medicine (vaccine) to protect against chickenpox. Shingles is rare in this group.  The first symptoms of shingles may be itching, tingling, or pain in an area on your skin. A rash will show on your skin a few days or weeks later. The rash is likely to be on one side of your body. The rash usually has a shape like a belt or a band. Over time, the rash turns into fluid-filled blisters. The blisters will break open, change into scabs, and dry up. Medicines may:   Help with pain and itching.   Help you get better sooner.   Help to prevent long-term problems.  Follow these instructions at home:  Medicines   Take over-the-counter and prescription medicines only as told by your doctor.   Put on an anti-itch cream or numbing cream where you have a rash, blisters, or scabs. Do this as told by your doctor.  Helping with itching and discomfort     Put cold, wet cloths (cold compresses) on the area of the rash or blisters as told by your doctor.   Cool baths can help you feel better. Try adding baking soda or dry oatmeal to the water to lessen itching. Do not bathe in hot water.  Blister and rash care   Keep your rash covered with a loose bandage (dressing).   Wear loose clothing that does not rub on your rash.   Keep your rash and blisters clean. To do this, wash the area with mild soap and cool water as told by your doctor.   Check your rash every day for signs of infection. Check for:  ? More redness, swelling, or pain.  ? Fluid or blood.  ? Warmth.  ? Pus or a bad smell.   Do not scratch your rash. Do not pick at your blisters. To help you to not scratch:  ? Keep your fingernails clean and cut short.  ? Wear gloves or mittens when you sleep, if  scratching is a problem.  General instructions   Rest as told by your doctor.   Keep all follow-up visits as told by your doctor. This is important.   Wash your hands often with soap and water. If soap and water are not available, use hand sanitizer. Doing this lowers your chance of getting a skin infection caused by germs (bacteria).   Your infection can cause chickenpox in people who have never had chickenpox or never got a shot of chickenpox vaccine. If you have blisters that did not change into scabs yet, try not to touch other people or be around other people, especially:  ? Babies.  ? Pregnant women.  ? Children who have areas of red, itchy, or rough skin (eczema).  ? Very old people who have transplants.  ? People who have a long-term (chronic) sickness, like cancer or AIDS.  Contact a doctor if:   Your pain does not get better with medicine.   Your pain does not get better after the rash heals.   You have any signs of infection in the rash area. These signs include:  ? More redness, swelling, or pain around the rash.  ? Fluid or blood coming from   the rash.  ? The rash area feeling warm to the touch.  ? Pus or a bad smell coming from the rash.  Get help right away if:   The rash is on your face or nose.   You have pain in your face or pain by your eye.   You lose feeling on one side of your face.   You have trouble seeing.   You have ear pain, or you have ringing in your ear.   You have a loss of taste.   Your condition gets worse.  Summary   Shingles gives you a painful skin rash and blisters that have fluid in them.   Shingles is an infection. It is caused by the same germ (virus) that causes chickenpox.   Keep your rash covered with a loose bandage (dressing). Wear loose clothing that does not rub on your rash.   If you have blisters that did not change into scabs yet, try not to touch other people or be around people.  This information is not intended to replace advice given to you by  your health care provider. Make sure you discuss any questions you have with your health care provider.  Document Released: 03/22/2008 Document Revised: 06/08/2017 Document Reviewed: 06/08/2017  Elsevier Interactive Patient Education  2019 [employer]

## 2018-11-03 NOTE — Progress Notes (Signed)
Name: Linda Rhodes   MRN: [id number]    DOB: 28-Nov-1975   Date:11/03/2018       Progress Note  Subjective  Chief Complaint  Chief Complaint  Patient presents with  . Herpes Zoster    has hx, left side of neck with burning    HPI  Patient has rash to left chest noticed it yesterday. Felt mildly feverish and felt burning pain the last few days but didn't see any rashes until yesterday. Endorses tingling pain on chest where vesicals are located.   States she has has multiple episodes in the past on right side of chest. States she has had at least 5 episodes of the past, has had it on her neck and back before. Denies long-term use of steroids, increase stress. HIV negative at last visit.   Patient Active Problem List   Diagnosis Date Noted  . 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 Herpes zoster without complication 53/61/4431  . Elevated blood pressure reading 07/18/2018  . Radiculopathy of lumbar region 12/15/2017  . Status post lumbar laminectomy 12/15/2017  . Intractable chronic migraine without aura and without status migrainosus 08/04/2016  . S/P hysterectomy 05/06/2015    Past Medical History:  Diagnosis Date  . Abscessed tooth 09/24/2016   right lower tooth removed.  . Anemia    in past, prior to removal of uterine fibroids  . Bladder infection   . Difficult intubation   . GERD (gastroesophageal reflux disease)   . Headache    migrains  . Hypertension   . Motion sickness    all moving vehicles  . Ovarian cyst   . PONV (postoperative nausea and vomiting)     Past Surgical History:  Procedure Laterality Date  . ABDOMINAL HYSTERECTOMY    . BACK SURGERY  2016   [facility] Neurosurgery - ruptured L4-5  . CESAREAN SECTION    . CHOLECYSTECTOMY    . CYSTOSCOPY N/A 05/06/2015   Procedure: CYSTOSCOPY;  Surgeon: Aletha Halim, MD;  Location: [facility];  Service: Gynecology;  Laterality: N/A;  . ESOPHAGOGASTRODUODENOSCOPY (EGD) WITH  PROPOFOL N/A 04/13/2018   Procedure: ESOPHAGOGASTRODUODENOSCOPY (EGD) WITH PROPOFOL;  Surgeon: Lin Landsman, MD;  Location: [facility] ENDOSCOPY;  Service: Gastroenterology;  Laterality: N/A;  . ETHMOIDECTOMY Right 11/25/2015   Procedure:  ANTERIOR ETHMOIDECTOMY;  Surgeon: Carloyn Manner, MD;  Location: [place];  Service: ENT;  Laterality: Right;  . FRONTAL SINUS EXPLORATION Right 11/25/2015   Procedure: FRONTAL SINUSOTOMY;  Surgeon: Carloyn Manner, MD;  Location: [place];  Service: ENT;  Laterality: Right;  . IMAGE GUIDED SINUS SURGERY N/A 11/25/2015   Procedure: IMAGE GUIDED SINUS SURGERY WITH BALLOON;  Surgeon: Carloyn Manner, MD;  Location: [place];  Service: ENT;  Laterality: N/A;  . LAPAROSCOPIC HYSTERECTOMY N/A 05/06/2015   Procedure: HYSTERECTOMY TOTAL LAPAROSCOPIC;  Surgeon: Aletha Halim, MD;  Location: [facility];  Service: Gynecology;  Laterality: N/A;  . LUMBAR LAMINECTOMY/DECOMPRESSION MICRODISCECTOMY N/A 10/04/2016   Procedure: right minimally invasive L5 S1 microdiscectomy;  Surgeon: Bayard Hugger, MD;  Location: [facility];  Service: Neurosurgery;  Laterality: N/A;  . MAXILLARY ANTROSTOMY Right 11/25/2015   Procedure: MAXILLARY ANTROSTOMY WITH TISSUE REMOVAL;  Surgeon: Carloyn Manner, MD;  Location: [place];  Service: ENT;  Laterality: Right;  . WISDOM TOOTH EXTRACTION  10/2115   3 teeth removed.    Social History   Tobacco Use  . Smoking status: Former Smoker    Packs/day: 0.50    Years:  7.00    Pack years: 3.50    Types: Cigarettes    Last attempt to quit: 10/19/2015    Years since quitting: 3.0  . Smokeless tobacco: Never Used  Substance Use Topics  . Alcohol use: Yes    Comment: socially - 1x/mo     Current Outpatient Medications:  .  acetaminophen (TYLENOL) 500 MG tablet, Take 1,000 mg by mouth every 6 (six) hours as needed for mild pain., Disp: , Rfl:  .  cyclobenzaprine (FLEXERIL) 5 MG tablet, Take 5 mg by mouth 3  (three) times daily as needed for muscle spasms., Disp: , Rfl:  .  famotidine (PEPCID) 20 MG tablet, Take 1 tablet (20 mg total) by mouth 2 (two) times daily., Disp: 60 tablet, Rfl: 0 .  metoprolol succinate (TOPROL-XL) 50 MG 24 hr tablet, Take 50 mg by mouth 2 (two) times daily. Take with or immediately following a meal., Disp: , Rfl:  .  pantoprazole (PROTONIX) 40 MG tablet, Take 1 tablet (40 mg total) by mouth 2 (two) times daily before a meal., Disp: 180 tablet, Rfl: 0 .  spironolactone (ALDACTONE) 100 MG tablet, Take 1 tablet (100 mg total) by mouth 2 (two) times daily., Disp: 60 tablet, Rfl: 3 .  varenicline (CHANTIX) 1 MG tablet, Take 1 tablet (1 mg total) by mouth 2 (two) times daily., Disp: 60 tablet, Rfl: 2 .  gabapentin (NEURONTIN) 300 MG capsule, Take 1 capsule (300 mg total) by mouth 3 (three) times daily as needed for up to 7 days., Disp: 21 capsule, Rfl: 9  Allergies  Allergen Reactions  . Aspirin Other (See Comments)    Causes burning feeling in her stomach.   . Adhesive [Tape] Rash    And skin tears from stronger tapes    ROS   No other specific complaints in a complete review of systems (except as listed in HPI above).  Objective  Vitals:   11/03/18 1457  BP: 118/74  Pulse: 99  Temp: 98.4 F (36.9 C)  TempSrc: Oral  SpO2: 94%  Weight: 142 lb 8 oz (64.6 kg)  Height: 5\' 7"  (1.702 m)    Body mass index is 22.32 kg/m.  Nursing Note and Vital Signs reviewed.  Physical Exam Constitutional:      Appearance: Normal appearance.  HENT:     Head: Normocephalic and atraumatic.  Cardiovascular:     Rate and Rhythm: Normal rate.  Pulmonary:     Effort: Pulmonary effort is normal.  Skin:    General: Skin is warm.       Neurological:     General: No focal deficit present.     Mental Status: She is oriented to person, place, and time.  Psychiatric:        Mood and Affect: Mood normal.        No results found for this or any previous visit (from the  past 48 hour(s)).  Assessment & Plan 1. Herpes zoster without complication Patient endorses at least 5 episodes of shingles in various locations- discussed with patient will treat with anti-virals today. Will also refer to infectious disease to further evaluate due to frequency without obvious cause.  - valACYclovir (VALTREX) 1000 MG tablet; Take 1 tablet (1,000 mg total) by mouth 3 (three) times daily.  Dispense: 21 tablet; Refill: 0 - Ambulatory referral to Infectious Disease

## 2018-11-07 ENCOUNTER — Telehealth: Payer: Self-pay | Admitting: Licensed Clinical Social Worker

## 2018-11-07 NOTE — Telephone Encounter (Signed)
Received a referral from [facility], I left message for patient to call me to schedule an appointment.

## 2018-11-09 ENCOUNTER — Other Ambulatory Visit
Admission: RE | Admit: 2018-11-09 | Discharge: 2018-11-09 | Disposition: A | Discharge status: Home / Self Care | Payer: BLUE CROSS/BLUE SHIELD | Source: Ambulatory Visit | Attending: Infectious Diseases | Admitting: Infectious Diseases

## 2018-11-09 ENCOUNTER — Ambulatory Visit: Payer: BLUE CROSS/BLUE SHIELD | Attending: Infectious Diseases | Admitting: Infectious Diseases

## 2018-11-09 ENCOUNTER — Encounter: Payer: Self-pay | Admitting: Infectious Diseases

## 2018-11-09 VITALS — BP 177/118 | HR 107 | Temp 97.4°F | Ht 67.0 in | Wt 140.0 lb

## 2018-11-09 DIAGNOSIS — M5416 Radiculopathy, lumbar region: Secondary | ICD-10-CM

## 2018-11-09 DIAGNOSIS — R21 Rash and other nonspecific skin eruption: Secondary | ICD-10-CM | POA: Diagnosis not present

## 2018-11-09 DIAGNOSIS — Z87891 Personal history of nicotine dependence: Secondary | ICD-10-CM

## 2018-11-09 DIAGNOSIS — B029 Zoster without complications: Secondary | ICD-10-CM

## 2018-11-09 DIAGNOSIS — I10 Essential (primary) hypertension: Secondary | ICD-10-CM | POA: Diagnosis not present

## 2018-11-09 DIAGNOSIS — Z79899 Other long term (current) drug therapy: Secondary | ICD-10-CM

## 2018-11-09 DIAGNOSIS — Z886 Allergy status to analgesic agent status: Secondary | ICD-10-CM

## 2018-11-09 DIAGNOSIS — Z8619 Personal history of other infectious and parasitic diseases: Secondary | ICD-10-CM | POA: Diagnosis not present

## 2018-11-09 DIAGNOSIS — Z87828 Personal history of other (healed) physical injury and trauma: Secondary | ICD-10-CM

## 2018-11-09 DIAGNOSIS — Z91048 Other nonmedicinal substance allergy status: Secondary | ICD-10-CM

## 2018-11-09 NOTE — Patient Instructions (Signed)
You have been referred to me for recurrent herpes zoster- today I see a cluster of excoriated lesions on the left side of your chest- this is atypical presentation- I am not sure hy you have recurrent lesions- You currently are taking valtrex- please complete the course- need to look into this further to see whether you are immune compromised ( routine test like HIV neg) or whether you need vaccination. Your BP is very high and you are on spironolactone please check it your PCP. Will be in touch

## 2018-11-09 NOTE — Progress Notes (Signed)
NAME: Linda Rhodes  DOB: [birth year]  MRN: [id number]  Date/Time: 11/09/2018 12:39 PM   Subjective:  REASON FOR CONSULT: Referred for recurrent herpes zoster  ? Linda Rhodes is a 43 y.o. female with a history of hypertension, lumbar disc surgery, shingles is presenting to me with painful lesion on the left side of her chest.  Patient states she first started getting shingles 6 years ago and it was in her back.  For the last year or 2 she has had it on the front of the chest on the left side and it has been recurrent.  She is currently on Valtrex which has been started by her primary physician last week.  she is HIV negative.  She is not on any steroids.  She is also been diagnosed with hypertension.  Recent blood work that showed an abnormal renin aldosterone ratio.  And she is being worked up for that.  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  Past Medical History:  Diagnosis Date  . Abscessed tooth 09/24/2016   right lower tooth removed.  . Anemia    in past, prior to removal of uterine fibroids  . Bladder infection   . Difficult intubation   . GERD (gastroesophageal reflux disease)   . Headache    migrains  . Hypertension   . Motion sickness    all moving vehicles  . Ovarian cyst   . PONV (postoperative nausea and vomiting)     Past Surgical History:  Procedure Laterality Date  . ABDOMINAL HYSTERECTOMY    . BACK SURGERY  2016   [facility] Neurosurgery - ruptured L4-5  . CESAREAN SECTION    . CHOLECYSTECTOMY    . CYSTOSCOPY N/A 05/06/2015   Procedure: CYSTOSCOPY;  Surgeon: Aletha Halim, MD;  Location: [facility] ORS;  Service: Gynecology;  Laterality: N/A;  . ESOPHAGOGASTRODUODENOSCOPY (EGD) WITH PROPOFOL N/A 04/13/2018   Procedure: ESOPHAGOGASTRODUODENOSCOPY (EGD) WITH PROPOFOL;  Surgeon: Lin Landsman, MD;  Location: [facility] ENDOSCOPY;  Service: Gastroenterology;  Laterality: N/A;  . ETHMOIDECTOMY Right 11/25/2015   Procedure:  ANTERIOR ETHMOIDECTOMY;  Surgeon:  Carloyn Manner, MD;  Location: [place];  Service: Rhodes;  Laterality: Right;  . FRONTAL SINUS EXPLORATION Right 11/25/2015   Procedure: FRONTAL SINUSOTOMY;  Surgeon: Carloyn Manner, MD;  Location: [place];  Service: Rhodes;  Laterality: Right;  . IMAGE GUIDED SINUS SURGERY N/A 11/25/2015   Procedure: IMAGE GUIDED SINUS SURGERY WITH BALLOON;  Surgeon: Carloyn Manner, MD;  Location: [place];  Service: Rhodes;  Laterality: N/A;  . LAPAROSCOPIC HYSTERECTOMY N/A 05/06/2015   Procedure: HYSTERECTOMY TOTAL LAPAROSCOPIC;  Surgeon: Aletha Halim, MD;  Location: [facility] ORS;  Service: Gynecology;  Laterality: N/A;  . LUMBAR LAMINECTOMY/DECOMPRESSION MICRODISCECTOMY N/A 10/04/2016   Procedure: right minimally invasive L5 S1 microdiscectomy;  Surgeon: Bayard Hugger, MD;  Location: [facility] ORS;  Service: Neurosurgery;  Laterality: N/A;  . MAXILLARY ANTROSTOMY Right 11/25/2015   Procedure: MAXILLARY ANTROSTOMY WITH TISSUE REMOVAL;  Surgeon: Carloyn Manner, MD;  Location: [place];  Service: Rhodes;  Laterality: Right;  . WISDOM TOOTH EXTRACTION  10/2115   3 teeth removed.    Social History   Socioeconomic History  . Marital status: Single    Spouse name: Not on file  . Number of children: Not on file  . Years of education: Not on file  . Highest education level: Not on file  Occupational History  . Not on file  Social Needs  . Financial resource strain: Not  hard at all  . Food insecurity:    Worry: Never true    Inability: Never true  . Transportation needs:    Medical: No    Non-medical: No  Tobacco Use  . Smoking status: Former Smoker    Packs/day: 0.50    Years: 7.00    Pack years: 3.50    Types: Cigarettes    Last attempt to quit: 10/19/2015    Years since quitting: 3.0  . Smokeless tobacco: Never Used  Substance and Sexual Activity  . Alcohol use: Yes    Comment: socially - 1x/mo  . Drug use: No  . Sexual activity: Yes  Lifestyle  . Physical activity:     Days per week: 0 days    Minutes per session: 0 min  . Stress: Not at all  Relationships  . Social connections:    Talks on phone: More than three times a week    Gets together: More than three times a week    Attends religious service: More than 4 times per year    Active member of club or organization: No    Attends meetings of clubs or organizations: Never    Relationship status: Never married  . Intimate partner violence:    Fear of current or ex partner: No    Emotionally abused: No    Physically abused: No    Forced sexual activity: No  Other Topics Concern  . Not on file  Social History Narrative  . Not on file    Family History  Problem Relation Age of Onset  . Diabetes Mother   . Hypertension Mother   . Diabetes Father   . Hypertension Father    Allergies  Allergen Reactions  . Aspirin Other (See Comments)    Causes burning feeling in her stomach.   . Adhesive [Tape] Rash    And skin tears from stronger tapes   ? Current Outpatient Medications  Medication Sig Dispense Refill  . acetaminophen (TYLENOL) 500 MG tablet Take 1,000 mg by mouth every 6 (six) hours as needed for mild pain.    . cyclobenzaprine (FLEXERIL) 5 MG tablet Take 5 mg by mouth 3 (three) times daily as needed for muscle spasms.    . famotidine (PEPCID) 20 MG tablet Take 1 tablet (20 mg total) by mouth 2 (two) times daily. 60 tablet 0  . metoprolol succinate (TOPROL-XL) 50 MG 24 hr tablet Take 50 mg by mouth 2 (two) times daily. Take with or immediately following a meal.    . pantoprazole (PROTONIX) 40 MG tablet Take 1 tablet (40 mg total) by mouth 2 (two) times daily before a meal. 180 tablet 0  . spironolactone (ALDACTONE) 100 MG tablet Take 1 tablet (100 mg total) by mouth 2 (two) times daily. 60 tablet 3  . valACYclovir (VALTREX) 1000 MG tablet Take 1 tablet (1,000 mg total) by mouth 3 (three) times daily. 21 tablet 0  . gabapentin (NEURONTIN) 300 MG capsule Take 1 capsule (300 mg total) by  mouth 3 (three) times daily as needed for up to 7 days. 21 capsule 9  . varenicline (CHANTIX) 1 MG tablet Take 1 tablet (1 mg total) by mouth 2 (two) times daily. (Patient not taking: Reported on 11/09/2018) 60 tablet 2   No current facility-administered medications for this visit.      Abtx:  Anti-infectives (From admission, onward)   None      REVIEW OF SYSTEMS:  Const: negative fever, negative chills, says she  is not able to gain weight Eyes: negative diplopia or visual changes, negative eye pain Rhodes: negative coryza, negative sore throat Resp: negative cough, hemoptysis, dyspnea Cards: Has chest pain, no palpitations, lower extremity edema GU: negative for frequency, dysuria and hematuria GI: Negative for abdominal pain, diarrhea, bleeding, constipation Skin: negative for rash and pruritus Heme: negative for easy bruising and gum/nose bleeding MS: negative for myalgias, arthralgias, has back pain and no muscle weakness Neurolo has headaches, no dizziness, vertigo, memory problems  Psych: negative for feelings of anxiety, depression  Endocrine: No polyuria Allergy/Immunology-aspirin causes burning feeling in the stomach Objective:  VITALS:  BP (!) 177/118 (BP Location: Left Arm, Patient Position: Sitting, Cuff Size: Normal)   Pulse (!) 107   Temp (!) 97.4 F (36.3 C) (Oral)   Ht 5\' 7"  (1.702 m)   Wt 140 lb (63.5 kg)   LMP  (LMP Unknown)   BMI 21.93 kg/m  PHYSICAL EXAM: Repeat BP 150 x 108 General: Alert, cooperative, withdrawn, mild distress appears stated age.  Head: Normocephalic, without obvious abnormality, atraumatic. Eyes: Conjunctivae clear, anicteric sclerae. Pupils are equal Rhodes Nares normal. No drainage or sinus tenderness. Lips, mucosa, and tongue normal. No Thrush Neck: Supple, symmetrical, no adenopathy, thyroid: non tender no carotid bruit and no JVD. Back: No CVA tenderness. Lungs: Clear to auscultation bilaterally. No Wheezing or Rhonchi. No  rales. Heart: Regular rate and rhythm, no murmur, rub or gallop. Abdomen: Soft, non-tender,not distended. Bowel sounds normal. No masses Extremities: atraumatic, no cyanosis. No edema. No clubbing Skin: No rashes or lesions. Or bruising Lymph: Cervical, supraclavicular normal. Neurologic: Grossly non-focal Chest wall left of the sternum just below the clavicle or a cluster of related pinpoint lesions     Pertinent Labs Lab Results CBC    Component Value Date/Time   WBC 5.9 08/18/2018 1117   RBC 4.57 08/18/2018 1117   HGB 14.8 08/18/2018 1117   HGB 12.3 11/21/2012 0815   HCT 42.1 08/18/2018 1117   HCT 36.4 11/21/2012 0815   PLT 237 08/18/2018 1117   PLT 175 11/21/2012 0815   MCV 92.1 08/18/2018 1117   MCV 92 11/21/2012 0815   MCH 32.4 08/18/2018 1117   MCHC 35.2 08/18/2018 1117   RDW 13.1 08/18/2018 1117   RDW 13.4 11/21/2012 0815   LYMPHSABS 1.7 08/18/2018 1117   MONOABS 0.5 08/18/2018 1117   EOSABS 0.1 08/18/2018 1117   BASOSABS 0.1 08/18/2018 1117    CMP Latest Ref Rng & Units 09/06/2018 08/18/2018 03/25/2018  Glucose 65 - 99 mg/dL 89 101(H) 113(H)  BUN 7 - 25 mg/dL 9 12 9   Creatinine 0.50 - 1.10 mg/dL 0.99 0.81 0.78  Sodium 135 - 146 mmol/L 135 138 139  Potassium 3.5 - 5.3 mmol/L 3.7 3.4(L) 3.5  Chloride 98 - 110 mmol/L 100 101 103  CO2 20 - 32 mmol/L 29 26 25   Calcium 8.6 - 10.2 mg/dL 9.1 8.7(L) 8.3(L)  Total Protein 6.1 - 8.1 g/dL 7.3 - 8.1  Total Bilirubin 0.2 - 1.2 mg/dL 0.8 - 0.9  Alkaline Phos 38 - 126 U/L - - 71  AST 10 - 30 U/L 15 - 26  ALT 6 - 29 U/L 7 - 14      Microbiology: No results found for this or any previous visit (from the past 240 hour(s)). IMAGING RESULTS: ? Impression/Recommendation ?43 y.o. female with a history of hypertension, lumbar disc surgery, shingles is presenting to me with painful lesion on the left side of her chest.  Patient states she first started getting shingles 6 years ago and it was in her back.  For the last year or  2 she has had it on the front of the chest on the left side and it has been recurrent.  She is currently on Valtrex which has been started by her primary physician last week ? Crop of pinpoint excoriated lesion on the left side of the chest below the clavicle there is an element of trauma as she scratches on these lesions.  Not sure whether it is herpes zoster.  If this is then it is an atypical presentation.  If we have to prove this is zoster we need a biopsy and sending it for pathology and culture.  Today try to do a viral culture with minimal fluid that was expressed from these lesions but I doubt it to be positive.  She is completing her course of Valtrex.  It is not really given much benefit for her.  If this is recurrent herpes zoster need to figure out why she is getting it.  She is not immunocompromised.  She is HIV negative.  Recommend that if she has lesions in the early stages she goes to a dermatologist first to get a biopsy done and then come and see me.  Hypertension is being followed by her primary care physician.  Today her blood pressure was high and I informed her PCP about that.  She is only on spironolactone  History of lumbar disc surgery after trauma.   ___________________________________________________ Discussed with patient, will inform her if the viral PCR comes back positive  note:  This document was prepared using Dragon voice recognition software and may include unintentional dictation errors.

## 2018-11-11 LAB — VARICELLA-ZOSTER BY PCR: VARICELLA-ZOSTER, PCR: NEGATIVE

## 2018-11-13 ENCOUNTER — Telehealth: Payer: Self-pay | Admitting: Licensed Clinical Social Worker

## 2018-11-13 ENCOUNTER — Encounter: Payer: Self-pay | Admitting: Nurse Practitioner

## 2018-11-13 ENCOUNTER — Telehealth: Payer: Self-pay

## 2018-11-13 ENCOUNTER — Ambulatory Visit (INDEPENDENT_AMBULATORY_CARE_PROVIDER_SITE_OTHER): Payer: BLUE CROSS/BLUE SHIELD | Admitting: Nurse Practitioner

## 2018-11-13 VITALS — BP 154/98 | HR 98 | Temp 98.9°F | Resp 16 | Ht 67.0 in | Wt 143.8 lb

## 2018-11-13 DIAGNOSIS — I151 Hypertension secondary to other renal disorders: Secondary | ICD-10-CM | POA: Diagnosis not present

## 2018-11-13 DIAGNOSIS — R21 Rash and other nonspecific skin eruption: Secondary | ICD-10-CM

## 2018-11-13 DIAGNOSIS — M792 Neuralgia and neuritis, unspecified: Secondary | ICD-10-CM

## 2018-11-13 DIAGNOSIS — E2609 Other primary hyperaldosteronism: Secondary | ICD-10-CM

## 2018-11-13 DIAGNOSIS — E876 Hypokalemia: Secondary | ICD-10-CM | POA: Diagnosis not present

## 2018-11-13 DIAGNOSIS — I152 Hypertension secondary to endocrine disorders: Secondary | ICD-10-CM

## 2018-11-13 MED ORDER — SPIRONOLACTONE 100 MG PO TABS: 100.0000 mg | ORAL_TABLET | Freq: Two times a day (BID) | ORAL | 3 refills | Status: DC

## 2018-11-13 NOTE — Progress Notes (Addendum)
Name: Linda Rhodes   MRN: [id number]    DOB: 07/03/1976   Date:11/13/2018       Progress Note  Subjective  Chief Complaint  Chief Complaint  Patient presents with  . Follow-up    patient stated that she is not feeling any better.    HPI  Patient has rash to chest, states has had similar rash at least 5-6 times and was treated and resolved with valcylovir; this last round of treatment did not provide any relief. She was sent to infectious disease due to frequency and biopsy was negative for varicella-zoster- she had been on anti-viral therapy for about 6 days when biopsy was completed. She notes the blisters have resolved but has burning pain in the area. Used ice packs and tylenol with minimal relief.   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 Patient does note some headaches the last few weeks, resolve when she lays down. States she checks her blood pressure at home and notice the last 2 weeks when the rash appeared her blood pressure had been getting higher- 242'A systolic.   Patient Active Problem List   Diagnosis Date Noted  . 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 Herpes zoster without complication 83/41/9622  . Elevated blood pressure reading 07/18/2018  . Radiculopathy of lumbar region 12/15/2017  . Status post lumbar laminectomy 12/15/2017  . Intractable chronic migraine without aura and without status migrainosus 08/04/2016  . S/P hysterectomy 05/06/2015    Past Medical History:  Diagnosis Date  . Abscessed tooth 09/24/2016   right lower tooth removed.  . Anemia    in past, prior to removal of uterine fibroids  . Bladder infection   . Difficult intubation   . GERD (gastroesophageal reflux disease)   . Headache    migrains  . Hypertension   . Motion  sickness    all moving vehicles  . Ovarian cyst   . PONV (postoperative nausea and vomiting)     Past Surgical History:  Procedure Laterality Date  . ABDOMINAL HYSTERECTOMY    . BACK SURGERY  2016   [facility] Neurosurgery - ruptured L4-5  . CESAREAN SECTION    . CHOLECYSTECTOMY    . CYSTOSCOPY N/A 05/06/2015   Procedure: CYSTOSCOPY;  Surgeon: Aletha Halim, MD;  Location: [facility] ORS;  Service: Gynecology;  Laterality: N/A;  . ESOPHAGOGASTRODUODENOSCOPY (EGD) WITH PROPOFOL N/A 04/13/2018   Procedure: ESOPHAGOGASTRODUODENOSCOPY (EGD) WITH PROPOFOL;  Surgeon: Lin Landsman, MD;  Location: [facility] ENDOSCOPY;  Service: Gastroenterology;  Laterality: N/A;  . ETHMOIDECTOMY Right 11/25/2015   Procedure:  ANTERIOR ETHMOIDECTOMY;  Surgeon: Carloyn Manner, MD;  Location: [place];  Service: ENT;  Laterality: Right;  . FRONTAL SINUS EXPLORATION Right 11/25/2015   Procedure: FRONTAL SINUSOTOMY;  Surgeon: Carloyn Manner, MD;  Location: [place];  Service: ENT;  Laterality: Right;  . IMAGE GUIDED SINUS SURGERY N/A 11/25/2015   Procedure: IMAGE GUIDED SINUS SURGERY WITH BALLOON;  Surgeon: Carloyn Manner, MD;  Location: [place];  Service: ENT;  Laterality: N/A;  . LAPAROSCOPIC HYSTERECTOMY N/A 05/06/2015   Procedure: HYSTERECTOMY TOTAL LAPAROSCOPIC;  Surgeon: Aletha Halim, MD;  Location: [facility] ORS;  Service: Gynecology;  Laterality: N/A;  . LUMBAR LAMINECTOMY/DECOMPRESSION MICRODISCECTOMY N/A 10/04/2016   Procedure: right minimally invasive L5 S1 microdiscectomy;  Surgeon: Bayard Hugger, MD;  Location: [facility]  ORS;  Service: Neurosurgery;  Laterality: N/A;  . MAXILLARY ANTROSTOMY Right 11/25/2015   Procedure: MAXILLARY ANTROSTOMY WITH TISSUE REMOVAL;  Surgeon: Carloyn Manner, MD;  Location: [place];  Service: ENT;  Laterality: Right;  . WISDOM TOOTH EXTRACTION  10/2115   3 teeth removed.    Social History   Tobacco Use  . Smoking status: Former Smoker     Packs/day: 0.50    Years: 7.00    Pack years: 3.50    Types: Cigarettes    Last attempt to quit: 10/19/2015    Years since quitting: 3.0  . Smokeless tobacco: Never Used  Substance Use Topics  . Alcohol use: Yes    Comment: socially - 1x/mo     Current Outpatient Medications:  .  acetaminophen (TYLENOL) 500 MG tablet, Take 1,000 mg by mouth every 6 (six) hours as needed for mild pain., Disp: , Rfl:  .  cyclobenzaprine (FLEXERIL) 5 MG tablet, Take 5 mg by mouth 3 (three) times daily as needed for muscle spasms., Disp: , Rfl:  .  famotidine (PEPCID) 20 MG tablet, Take 1 tablet (20 mg total) by mouth 2 (two) times daily., Disp: 60 tablet, Rfl: 0 .  gabapentin (NEURONTIN) 300 MG capsule, Take 1 capsule (300 mg total) by mouth 3 (three) times daily as needed for up to 7 days., Disp: 21 capsule, Rfl: 9 .  metoprolol succinate (TOPROL-XL) 50 MG 24 hr tablet, Take 50 mg by mouth 2 (two) times daily. Take with or immediately following a meal., Disp: , Rfl:  .  pantoprazole (PROTONIX) 40 MG tablet, Take 1 tablet (40 mg total) by mouth 2 (two) times daily before a meal., Disp: 180 tablet, Rfl: 0 .  spironolactone (ALDACTONE) 100 MG tablet, Take 1 tablet (100 mg total) by mouth 2 (two) times daily., Disp: 60 tablet, Rfl: 3 .  valACYclovir (VALTREX) 1000 MG tablet, Take 1 tablet (1,000 mg total) by mouth 3 (three) times daily., Disp: 21 tablet, Rfl: 0 .  varenicline (CHANTIX) 1 MG tablet, Take 1 tablet (1 mg total) by mouth 2 (two) times daily. (Patient not taking: Reported on 11/09/2018), Disp: 60 tablet, Rfl: 2  Allergies  Allergen Reactions  . Aspirin Other (See Comments)    Causes burning feeling in her stomach.   . Adhesive [Tape] Rash    And skin tears from stronger tapes    ROS    No other specific complaints in a complete review of systems (except as listed in HPI above).  Objective  Vitals:   11/13/18 0845  BP: (!) 154/98  Pulse: 98  Resp: 16  Temp: 98.9 F (37.2 C)  TempSrc:  Oral  SpO2: 99%  Weight: 143 lb 12.8 oz (65.2 kg)  Height: 5\' 7"  (1.702 m)    Body mass index is 22.52 kg/m.  Nursing Note and Vital Signs reviewed.  Physical Exam Vitals signs reviewed.  Constitutional:      Appearance: She is well-developed.  HENT:     Head: Normocephalic and atraumatic.  Neck:     Musculoskeletal: Normal range of motion and neck supple. No muscular tenderness.     Vascular: No carotid bruit.  Cardiovascular:     Heart sounds: Normal heart sounds.  Pulmonary:     Effort: Pulmonary effort is normal.     Breath sounds: Normal breath sounds.  Abdominal:     General: Bowel sounds are normal.     Palpations: Abdomen is soft.     Tenderness: There is no  abdominal tenderness.  Musculoskeletal: Normal range of motion.  Skin:    General: Skin is warm and dry.     Capillary Refill: Capillary refill takes less than 2 seconds.       Neurological:     Mental Status: She is alert and oriented to person, place, and time.     GCS: GCS eye subscore is 4. GCS verbal subscore is 5. GCS motor subscore is 6.     Sensory: No sensory deficit.  Psychiatric:        Speech: Speech normal.        Behavior: Behavior normal.        Thought Content: Thought content normal.        Judgment: Judgment normal.        No results found for this or any previous visit (from the past 48 hour(s)).  Assessment & Plan 1. Rash of unknown cause Still painful, however, rash itself is resolving and crusting, negative varizella-zoster from infectious disease biopsy, refer to derm still consider post-herpetic neuralgia medications- will check kidney function today and rx low dose gabapentin if normal function, consider capsicin/lidocaine if alternation in kidney function.  - Ambulatory referral to Dermatology  2. Low serum potassium Will recheck as she missed follow-up appointment since adjusting spironolactone.  - Basic Metabolic Panel (BMET)  3. Hyperaldosteronism with hyperplasia  of adrenal cortex ([place]) - Basic Metabolic Panel (BMET) - spironolactone (ALDACTONE) 100 MG tablet; Take 1 tablet (100 mg total) by mouth 2 (two) times daily.  Dispense: 60 tablet; Refill: 3  4. 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  - Basic Metabolic Panel (BMET) - spironolactone (ALDACTONE) 100 MG tablet; Take 1 tablet (100 mg total) by mouth 2 (two) times daily.  Dispense: 60 tablet; Refill: 3   -Red flags and when to present for emergency care or RTC including fever >101.22F, chest pain, shortness of breath, new/worsening/un-resolving symptoms, stroke symptoms reviewed with patient at time of visit. Follow up and care instructions discussed and provided in AVS. -Reviewed Health Maintenance: due for pap- will have BP follow up and schedule physical afterwards Face-to-face time with patient was more than 25 minutes, >50% time spent counseling and coordination of care

## 2018-11-13 NOTE — Telephone Encounter (Signed)
I called patient with results and she agreed with plan.

## 2018-11-13 NOTE — Telephone Encounter (Signed)
I spoke with the patient and she stated she is taking everything that is on her list except the gabapentin b/c she is out.

## 2018-11-13 NOTE — Telephone Encounter (Signed)
-----   Message from Tsosie Billing, MD sent at 11/13/2018  9:54 AM EST ----- Can you please call the patient and let her know of the result. IF she develops new lesions she will have to come and see [place]. thx

## 2018-11-13 NOTE — Patient Instructions (Addendum)
-   We are checking your electrolytes and kidney functions today and based off these results will adjust your blood pressure medication or add a new one and will send in a short-term medication to help with your burning pain at the rash area.   - Sending a referral to dermatology because it take awhile to get in with them and the next time your rash appears we want to make sure you can get an appointment right away.  - Continue monitoring blood pressures when feeling off or when resting and relaxed maybe 3 times a week and bring in blood pressure log and your blood pressure cuff at next visit.  - If you have severe headaches, blurry vision, slurred speech, dizziness, weakness on one side of your body or the other please call 911   Your goal blood pressure is less than 140 mmHg on top. Try to follow the DASH guidelines (DASH stands for Dietary Approaches to Stop Hypertension) Try to limit the sodium in your diet.  Ideally, consume less than 1.5 grams (less than 1,500mg ) per day. Do not add salt when cooking or at the table.  Check the sodium amount on labels when shopping, and choose items lower in sodium when given a choice. Avoid or limit foods that already contain a lot of sodium. Eat a diet rich in fruits and vegetables and whole grains.

## 2018-11-14 ENCOUNTER — Other Ambulatory Visit: Payer: Self-pay | Admitting: Nurse Practitioner

## 2018-11-14 ENCOUNTER — Telehealth: Payer: Self-pay | Admitting: Nurse Practitioner

## 2018-11-14 DIAGNOSIS — E876 Hypokalemia: Secondary | ICD-10-CM

## 2018-11-14 DIAGNOSIS — I151 Hypertension secondary to other renal disorders: Secondary | ICD-10-CM

## 2018-11-14 DIAGNOSIS — E2609 Other primary hyperaldosteronism: Secondary | ICD-10-CM

## 2018-11-14 LAB — BASIC METABOLIC PANEL
BUN: 9 mg/dL (ref 7–25)
CO2: 28 mmol/L (ref 20–32)
Calcium: 9.3 mg/dL (ref 8.6–10.2)
Chloride: 103 mmol/L (ref 98–110)
Creat: 0.9 mg/dL (ref 0.50–1.10)
Glucose, Bld: 77 mg/dL (ref 65–99)
Potassium: 3.6 mmol/L (ref 3.5–5.3)
Sodium: 141 mmol/L (ref 135–146)

## 2018-11-14 MED ORDER — GABAPENTIN 300 MG PO CAPS: 300.0000 mg | ORAL_CAPSULE | Freq: Every evening | ORAL | 0 refills | Status: DC | PRN

## 2018-11-14 MED ORDER — SPIRONOLACTONE 100 MG PO TABS: 100.0000 mg | ORAL_TABLET | Freq: Three times a day (TID) | ORAL | 3 refills | Status: DC

## 2018-11-14 NOTE — Telephone Encounter (Signed)
Please call- spoke with Dr. Celine Ahr who recommended increasing your spironolactone- we will go from 100mg  tablets twice a day to 100mg  tablets three times a day and will recheck your potassium at your 2 week follow up appointment. If you develop palpitations, headache, dizziness with changing positions please let us know right away. If your pharmacy does not carry the 100mg  tablets and you would like [place] to send the rx somewhere else just let us know where.

## 2018-11-14 NOTE — Addendum Note (Signed)
Addended by: Fredderick Severance on: 11/14/2018 11:17 AM   Modules accepted: Orders

## 2018-11-14 NOTE — Addendum Note (Signed)
Addended by: Fredderick Severance on: 11/14/2018 01:16 PM   Modules accepted: Orders

## 2018-11-14 NOTE — Telephone Encounter (Signed)
Per the request of Linda Rhodes, I contacted this patient and reviewed the message. She said ok and will see [place] in a couple of weeks.

## 2018-12-07 ENCOUNTER — Ambulatory Visit: Payer: BLUE CROSS/BLUE SHIELD | Admitting: Family Medicine

## 2018-12-09 ENCOUNTER — Other Ambulatory Visit: Payer: Self-pay | Admitting: Family Medicine

## 2018-12-09 DIAGNOSIS — Z716 Tobacco abuse counseling: Secondary | ICD-10-CM

## 2018-12-11 MED ORDER — VARENICLINE TARTRATE 0.5 MG X 11 & 1 MG X 42 PO MISC: ORAL | 0 refills | Status: DC

## 2018-12-13 ENCOUNTER — Ambulatory Visit: Payer: BLUE CROSS/BLUE SHIELD | Admitting: Family Medicine

## 2019-02-07 ENCOUNTER — Encounter: Payer: BLUE CROSS/BLUE SHIELD | Admitting: Family Medicine

## 2019-05-07 ENCOUNTER — Ambulatory Visit (INDEPENDENT_AMBULATORY_CARE_PROVIDER_SITE_OTHER): Payer: BLUE CROSS/BLUE SHIELD

## 2019-05-07 ENCOUNTER — Ambulatory Visit
Admission: EM | Admit: 2019-05-07 | Discharge: 2019-05-07 | Disposition: A | Discharge status: Home / Self Care | Payer: BLUE CROSS/BLUE SHIELD | Attending: Family Medicine | Admitting: Family Medicine

## 2019-05-07 ENCOUNTER — Other Ambulatory Visit: Payer: Self-pay

## 2019-05-07 DIAGNOSIS — W010XXA Fall on same level from slipping, tripping and stumbling without subsequent striking against object, initial encounter: Secondary | ICD-10-CM | POA: Diagnosis not present

## 2019-05-07 DIAGNOSIS — M25572 Pain in left ankle and joints of left foot: Secondary | ICD-10-CM | POA: Diagnosis not present

## 2019-05-07 DIAGNOSIS — S8392XA Sprain of unspecified site of left knee, initial encounter: Secondary | ICD-10-CM

## 2019-05-07 DIAGNOSIS — M79605 Pain in left leg: Secondary | ICD-10-CM

## 2019-05-07 DIAGNOSIS — S93402A Sprain of unspecified ligament of left ankle, initial encounter: Secondary | ICD-10-CM

## 2019-05-07 DIAGNOSIS — M25562 Pain in left knee: Secondary | ICD-10-CM | POA: Diagnosis not present

## 2019-05-07 NOTE — ED Triage Notes (Signed)
Pt states on Saturday she was running from her car to the house in the rain. Slipped in a ditch and twisted her ankle. Left lateral ankle pain and swelling 7/10 and pain to back of leg. Has appt with ortho on Thursday.

## 2019-05-07 NOTE — ED Provider Notes (Signed)
[facility] ____________________________________________  Time seen: Approximately 1:20 PM  I have reviewed the triage vital signs and the nursing notes.   HISTORY  Chief Complaint Ankle Pain and Leg Pain   HPI Linda Rhodes is a 43 y.o. female presenting for evaluation of left leg pain post injury that occurred this past Saturday.  States she was getting her car going into the house and it was raining causing her to slip and fall.  States that she twisted her left ankle but also injured her knee as well.  States has been able to weight-bear but painful in doing so.  History of low back issues with previous surgery, but denies any acute back pain.  Denies head injury or loss of conscious.  Reports otherwise doing well.  Denies alleviating measures.  Denies recent sickness.  [facility]  Past Medical History:  Diagnosis Date  . Abscessed tooth 09/24/2016   right lower tooth removed.  . Anemia    in past, prior to removal of uterine fibroids  . Bladder infection   . Difficult intubation   . GERD (gastroesophageal reflux disease)   . Headache    migrains  . Hypertension   . Motion sickness    all moving vehicles  . Ovarian cyst   . PONV (postoperative nausea and vomiting)     Patient Active Problem List   Diagnosis Date Noted  . 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 Herpes zoster without complication 64/33/2951  . Elevated blood pressure reading 07/18/2018  . Radiculopathy of lumbar region 12/15/2017  . Status post lumbar laminectomy 12/15/2017  . Intractable chronic migraine without aura and without status migrainosus 08/04/2016  . S/P hysterectomy 05/06/2015    Past Surgical History:  Procedure Laterality Date  . ABDOMINAL HYSTERECTOMY    . BACK SURGERY  2016   [facility] Neurosurgery - ruptured L4-5  . CESAREAN SECTION    . CHOLECYSTECTOMY    . CYSTOSCOPY N/A 05/06/2015   Procedure: CYSTOSCOPY;  Surgeon: Aletha Halim, MD;  Location: [facility];  Service: Gynecology;  Laterality: N/A;  . ESOPHAGOGASTRODUODENOSCOPY (EGD) WITH PROPOFOL N/A 04/13/2018   Procedure: ESOPHAGOGASTRODUODENOSCOPY (EGD) WITH PROPOFOL;  Surgeon: Lin Landsman, MD;  Location: [facility] ENDOSCOPY;  Service: Gastroenterology;  Laterality: N/A;  . ETHMOIDECTOMY Right 11/25/2015   Procedure:  ANTERIOR ETHMOIDECTOMY;  Surgeon: Carloyn Manner, MD;  Location: [place];  Service: ENT;  Laterality: Right;  . FRONTAL SINUS EXPLORATION Right 11/25/2015   Procedure: FRONTAL SINUSOTOMY;  Surgeon: Carloyn Manner, MD;  Location: [place];  Service: ENT;  Laterality: Right;  . IMAGE GUIDED SINUS SURGERY N/A 11/25/2015   Procedure: IMAGE GUIDED SINUS SURGERY WITH BALLOON;  Surgeon: Carloyn Manner, MD;  Location: [place];  Service: ENT;  Laterality: N/A;  . LAPAROSCOPIC HYSTERECTOMY N/A 05/06/2015   Procedure: HYSTERECTOMY TOTAL LAPAROSCOPIC;  Surgeon: Aletha Halim, MD;  Location: [facility];  Service: Gynecology;  Laterality: N/A;  . LUMBAR LAMINECTOMY/DECOMPRESSION MICRODISCECTOMY N/A 10/04/2016   Procedure: right minimally invasive L5 S1 microdiscectomy;  Surgeon: Bayard Hugger, MD;  Location: [facility];  Service: Neurosurgery;  Laterality: N/A;  . MAXILLARY ANTROSTOMY Right 11/25/2015   Procedure: MAXILLARY ANTROSTOMY WITH TISSUE REMOVAL;  Surgeon: Carloyn Manner, MD;  Location: [place];  Service: ENT;  Laterality: Right;  . WISDOM TOOTH EXTRACTION  10/2115   3 teeth removed.     No current facility-administered medications for this encounter.   Current Outpatient Medications:  .  acetaminophen (TYLENOL)  500 MG tablet, Take 1,000 mg by mouth every 6 (six) hours as needed for mild pain., Disp: , Rfl:  .  CHANTIX 1 MG tablet, TAKE 1 TABLET BY MOUTH TWICE A DAY, Disp: 180 tablet, Rfl: 0 .  cyclobenzaprine (FLEXERIL) 5 MG tablet, Take 5 mg by mouth 3 (three) times daily as needed for muscle spasms.,  Disp: , Rfl:  .  famotidine (PEPCID) 20 MG tablet, Take 1 tablet (20 mg total) by mouth 2 (two) times daily., Disp: 60 tablet, Rfl: 0 .  gabapentin (NEURONTIN) 300 MG capsule, Take 1 capsule (300 mg total) by mouth at bedtime as needed., Disp: 21 capsule, Rfl: 0 .  metoprolol succinate (TOPROL-XL) 50 MG 24 hr tablet, Take 50 mg by mouth 2 (two) times daily. Take with or immediately following a meal., Disp: , Rfl:  .  pantoprazole (PROTONIX) 40 MG tablet, Take 1 tablet (40 mg total) by mouth 2 (two) times daily before a meal., Disp: 180 tablet, Rfl: 0 .  spironolactone (ALDACTONE) 100 MG tablet, Take 1 tablet (100 mg total) by mouth 3 (three) times daily., Disp: 90 tablet, Rfl: 3 .  valACYclovir (VALTREX) 1000 MG tablet, Take 1 tablet (1,000 mg total) by mouth 3 (three) times daily., Disp: 21 tablet, Rfl: 0 .  varenicline (CHANTIX STARTING MONTH PAK) 0.5 MG X 11 & 1 MG X 42 tablet, Take one 0.5 mg tablet by mouth once daily for 3 days, then increase to one 0.5 mg tablet twice daily for 4 days, then increase to one 1 mg tablet twice daily., Disp: 53 tablet, Rfl: 0  Allergies Aspirin and Adhesive [tape]  Family History  Problem Relation Age of Onset  . Diabetes Mother   . Hypertension Mother   . Diabetes Father   . Hypertension Father     Social History Social History   Tobacco Use  . Smoking status: Former Smoker    Packs/day: 0.50    Years: 7.00    Pack years: 3.50    Types: Cigarettes    Quit date: 10/19/2015    Years since quitting: 3.5  . Smokeless tobacco: Never Used  Substance Use Topics  . Alcohol use: Yes    Comment: socially - 1x/mo  . Drug use: No    Review of Systems Constitutional: No fever ENT: No sore throat. Cardiovascular: Denies chest pain. Respiratory: Denies shortness of breath. Gastrointestinal: No abdominal pain.  Genitourinary: Negative for dysuria. Musculoskeletal: Positive left leg pain. Skin: Negative for rash.   ____________________________________________   PHYSICAL EXAM:  VITAL SIGNS: ED Triage Vitals  Enc Vitals Group     BP 05/07/19 1307 (!) 155/108     Pulse Rate 05/07/19 1307 87     Resp 05/07/19 1307 16     Temp 05/07/19 1307 98.9 F (37.2 C)     Temp Source 05/07/19 1307 Oral     SpO2 05/07/19 1307 99 %     Weight 05/07/19 1307 135 lb (61.2 kg)     Height 05/07/19 1307 5\' 8"  (1.727 m)     Head Circumference --      Peak Flow --      Pain Score 05/07/19 1306 7     Pain Loc --      Pain Edu? --      Excl. in [place]? --     Constitutional: Alert and oriented. Well appearing and in no acute distress. Eyes: Conjunctivae are normal. ENT      Head: Normocephalic and atraumatic. Cardiovascular:  Normal rate, regular rhythm. Grossly normal heart sounds.  Good peripheral circulation. Respiratory: Normal respiratory effort without tachypnea nor retractions. Breath sounds are clear and equal bilaterally. No wheezes, rales, rhonchi. Musculoskeletal: Gait not tested due to pain.  Bilateral distal pedal pulses equally palpated Except: Left lateral malleolus moderate tenderness direct palpation with mild localized swelling, able to fully plantarflex and dorsiflexion, pain with rotation of left ankle. Except: Diffuse left knee tenderness to palpation, no laxity noted, pain with medial and lateral stress as well as tenderness to anterior knee.  Tenderness also noted to mid lateral left shin, no swelling or ecchymosis. Neurologic:  Normal speech and language. Speech is normal.  Skin:  Skin is warm, dry and intact. No rash noted. Psychiatric: Mood and affect are normal. Speech and behavior are normal. Patient exhibits appropriate insight and judgment   ___________________________________________   LABS (all labs ordered are listed, but only abnormal results are displayed)  Labs Reviewed - No data to display ____________________________________________  RADIOLOGY  Dg Tibia/fibula Left   Result Date: 05/07/2019 CLINICAL DATA:  Tripped and fell.  Left leg pain. EXAM: LEFT TIBIA AND FIBULA - 2 VIEW COMPARISON:  None. FINDINGS: The knee and ankle joints are maintained. No acute fracture of the tibia or fibula is identified. IMPRESSION: No acute bony findings. Electronically Signed   By: Marijo Sanes M.D.   On: 05/07/2019 13:47   Dg Ankle Complete Left  Result Date: 05/07/2019 CLINICAL DATA:  Tripped and fell.  Left ankle pain. EXAM: LEFT ANKLE COMPLETE - 3+ VIEW COMPARISON:  None. FINDINGS: The ankle mortise is maintained. No acute ankle fracture or osteochondral lesion. No definite joint effusion. The mid and hindfoot bony structures are intact. IMPRESSION: No acute fracture. Electronically Signed   By: Marijo Sanes M.D.   On: 05/07/2019 13:48   Dg Knee Complete 4 Views Left  Result Date: 05/07/2019 CLINICAL DATA:  Tripped and fell.  Left knee pain. EXAM: LEFT KNEE - COMPLETE 4+ VIEW COMPARISON:  None. FINDINGS: The joint spaces are maintained. No acute fracture or osteochondral lesion. No degenerative changes. No definite joint effusion. IMPRESSION: No acute bony findings. Electronically Signed   By: Marijo Sanes M.D.   On: 05/07/2019 13:49   ____________________________________________   PROCEDURES Procedures    INITIAL IMPRESSION / ASSESSMENT AND PLAN / ED COURSE  Pertinent labs & imaging results that were available during my care of the patient were reviewed by me and considered in my medical decision making (see chart for details).  Well-appearing patient.  No acute distress.  Left leg pain post mechanical injury.  Will evaluate x-rays.  X-rays as above per radiologist, no acute bony abnormality.  Suspect sprain and contusion injuries.  Left ankle Velcro ASO splint given and crutches given for supportive care for the next 2 to 3 days.  Ice, elevate.  Over-the-counter Tylenol and ibuprofen.  Patient reports that she has an appointment this Thursday with her orthopedist,  keep appointment for follow-up.  Discussed follow up with Primary care physician this week as needed. Discussed follow up and return parameters including no resolution or any worsening concerns. Patient verbalized understanding and agreed to plan.   ____________________________________________   FINAL CLINICAL IMPRESSION(S) / ED DIAGNOSES  Final diagnoses:  Sprain of left ankle, unspecified ligament, initial encounter  Sprain of left knee, unspecified ligament, initial encounter     ED Discharge Orders    None       Note: This dictation was prepared with Dragon dictation along  with smaller phrase technology. Any transcriptional errors that result from this process are unintentional.         Marylene Land, NP 05/07/19 1422

## 2019-05-07 NOTE — Discharge Instructions (Addendum)
Rest. Ice. Follow up with orthopedic as scheduled. Over the counter tylenol and ibuprofen as needed.   Follow up with your primary care physician this week as needed. Return to Urgent care for new or worsening concerns.

## 2019-05-16 ENCOUNTER — Ambulatory Visit
Admission: EM | Admit: 2019-05-16 | Discharge: 2019-05-16 | Disposition: A | Discharge status: Home / Self Care | Payer: BLUE CROSS/BLUE SHIELD | Attending: Family Medicine | Admitting: Family Medicine

## 2019-05-16 ENCOUNTER — Encounter: Payer: Self-pay | Admitting: Emergency Medicine

## 2019-05-16 ENCOUNTER — Other Ambulatory Visit: Payer: Self-pay

## 2019-05-16 DIAGNOSIS — L03213 Periorbital cellulitis: Secondary | ICD-10-CM

## 2019-05-16 DIAGNOSIS — H10022 Other mucopurulent conjunctivitis, left eye: Secondary | ICD-10-CM

## 2019-05-16 MED ORDER — SULFAMETHOXAZOLE-TRIMETHOPRIM 800-160 MG PO TABS: 1.0000 | ORAL_TABLET | Freq: Two times a day (BID) | ORAL | 0 refills | Status: DC

## 2019-05-16 MED ORDER — MOXIFLOXACIN HCL 0.5 % OP SOLN: 1.0000 [drp] | Freq: Three times a day (TID) | OPHTHALMIC | 0 refills | Status: DC

## 2019-05-16 NOTE — ED Provider Notes (Signed)
[facility]    CSN: [id number] Arrival date & time: 05/16/19  0857     History   Chief Complaint Chief Complaint  Patient presents with  . Eye Drainage  . Facial Swelling    HPI Linda Rhodes is a 43 y.o. female.   43 yo female with a c/o left eye drainage and eyelid swelling and tenderness for the past 2 days. States she saw her eye doctor last week and was prescribed some antibiotic drops which help however states it started worsening again. Denies any injuries/trauma, foreign bodies, fevers, chills. The eyelid swelling and tenderness began 2 days ago.      Past Medical History:  Diagnosis Date  . Abscessed tooth 09/24/2016   right lower tooth removed.  . Anemia    in past, prior to removal of uterine fibroids  . Bladder infection   . Difficult intubation   . GERD (gastroesophageal reflux disease)   . Headache    migrains  . Hypertension   . Motion sickness    all moving vehicles  . Ovarian cyst   . PONV (postoperative nausea and vomiting)     Patient Active Problem List   Diagnosis Date Noted  . 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 Herpes zoster without complication 02/40/9735  . Elevated blood pressure reading 07/18/2018  . Radiculopathy of lumbar region 12/15/2017  . Status post lumbar laminectomy 12/15/2017  . Intractable chronic migraine without aura and without status migrainosus 08/04/2016  . S/P hysterectomy 05/06/2015    Past Surgical History:  Procedure Laterality Date  . ABDOMINAL HYSTERECTOMY    . BACK SURGERY  2016   [facility] Neurosurgery - ruptured L4-5  . CESAREAN SECTION    . CHOLECYSTECTOMY    . CYSTOSCOPY N/A 05/06/2015   Procedure: CYSTOSCOPY;  Surgeon: Aletha Halim, MD;  Location: [facility];  Service: Gynecology;  Laterality: N/A;  . ESOPHAGOGASTRODUODENOSCOPY (EGD) WITH PROPOFOL N/A 04/13/2018   Procedure: ESOPHAGOGASTRODUODENOSCOPY (EGD) WITH PROPOFOL;  Surgeon: Lin Landsman, MD;  Location: [facility] ENDOSCOPY;  Service: Gastroenterology;  Laterality: N/A;  . ETHMOIDECTOMY Right 11/25/2015   Procedure:  ANTERIOR ETHMOIDECTOMY;  Surgeon: Carloyn Manner, MD;  Location: [place];  Service: ENT;  Laterality: Right;  . FRONTAL SINUS EXPLORATION Right 11/25/2015   Procedure: FRONTAL SINUSOTOMY;  Surgeon: Carloyn Manner, MD;  Location: [place];  Service: ENT;  Laterality: Right;  . IMAGE GUIDED SINUS SURGERY N/A 11/25/2015   Procedure: IMAGE GUIDED SINUS SURGERY WITH BALLOON;  Surgeon: Carloyn Manner, MD;  Location: [place];  Service: ENT;  Laterality: N/A;  . LAPAROSCOPIC HYSTERECTOMY N/A 05/06/2015   Procedure: HYSTERECTOMY TOTAL LAPAROSCOPIC;  Surgeon: Aletha Halim, MD;  Location: [facility];  Service: Gynecology;  Laterality: N/A;  . LUMBAR LAMINECTOMY/DECOMPRESSION MICRODISCECTOMY N/A 10/04/2016   Procedure: right minimally invasive L5 S1 microdiscectomy;  Surgeon: Bayard Hugger, MD;  Location: [facility];  Service: Neurosurgery;  Laterality: N/A;  . MAXILLARY ANTROSTOMY Right 11/25/2015   Procedure: MAXILLARY ANTROSTOMY WITH TISSUE REMOVAL;  Surgeon: Carloyn Manner, MD;  Location: [place];  Service: ENT;  Laterality: Right;  . WISDOM TOOTH EXTRACTION  10/2115   3 teeth removed.    OB History   No obstetric history on file.      Home Medications    Prior to Admission medications   Medication Sig Start Date End Date Taking? Authorizing Provider  acetaminophen (TYLENOL) 500 MG tablet Take 1,000 mg by mouth every 6 (six) hours  as needed for mild pain.   Yes [provider]  CHANTIX 1 MG tablet TAKE 1 TABLET BY MOUTH TWICE A DAY 12/11/18  Yes Hubbard Hartshorn, FNP  famotidine (PEPCID) 20 MG tablet Take 1 tablet (20 mg total) by mouth 2 (two) times daily. 03/25/18  Yes Carrie Mew, MD  metoprolol succinate (TOPROL-XL) 50 MG 24 hr tablet Take 50 mg by mouth 2 (two) times daily. Take with or immediately following  a meal.   Yes [provider]  spironolactone (ALDACTONE) 100 MG tablet Take 1 tablet (100 mg total) by mouth 3 (three) times daily. 11/14/18  Yes Poulose, Bethel Born, NP  valACYclovir (VALTREX) 1000 MG tablet Take 1 tablet (1,000 mg total) by mouth 3 (three) times daily. 11/03/18  Yes Poulose, Bethel Born, NP  cyclobenzaprine (FLEXERIL) 5 MG tablet Take 5 mg by mouth 3 (three) times daily as needed for muscle spasms.    [provider]  gabapentin (NEURONTIN) 300 MG capsule Take 1 capsule (300 mg total) by mouth at bedtime as needed. 11/14/18   Poulose, Bethel Born, NP  moxifloxacin (VIGAMOX) 0.5 % ophthalmic solution Place 1 drop into the left eye 3 (three) times daily. 05/16/19   Norval Gable, MD  pantoprazole (PROTONIX) 40 MG tablet Take 1 tablet (40 mg total) by mouth 2 (two) times daily before a meal. 08/18/18 11/16/18  Vanga, Tally Due, MD  sulfamethoxazole-trimethoprim (BACTRIM DS) 800-160 MG tablet Take 1 tablet by mouth 2 (two) times daily. 05/16/19   Norval Gable, MD  varenicline (CHANTIX STARTING MONTH PAK) 0.5 MG X 11 & 1 MG X 42 tablet Take one 0.5 mg tablet by mouth once daily for 3 days, then increase to one 0.5 mg tablet twice daily for 4 days, then increase to one 1 mg tablet twice daily. 12/11/18   Hubbard Hartshorn, FNP    Family History Family History  Problem Relation Age of Onset  . Diabetes Mother   . Hypertension Mother   . Diabetes Father   . Hypertension Father     Social History Social History   Tobacco Use  . Smoking status: Former Smoker    Packs/day: 0.50    Years: 7.00    Pack years: 3.50    Types: Cigarettes    Quit date: 10/19/2015    Years since quitting: 3.5  . Smokeless tobacco: Never Used  Substance Use Topics  . Alcohol use: Yes    Comment: socially - 1x/mo  . Drug use: No     Allergies   Aspirin and Adhesive [tape]   Review of Systems Review of Systems   Physical Exam Triage Vital Signs ED Triage Vitals  Enc Vitals  Group     BP 05/16/19 0906 (!) 149/96     Pulse Rate 05/16/19 0906 72     Resp 05/16/19 0906 18     Temp 05/16/19 0906 98.2 F (36.8 C)     Temp Source 05/16/19 0906 Oral     SpO2 05/16/19 0906 100 %     Weight 05/16/19 0904 135 lb (61.2 kg)     Height 05/16/19 0904 5\' 7"  (1.702 m)     Head Circumference --      Peak Flow --      Pain Score 05/16/19 0904 4     Pain Loc --      Pain Edu? --      Excl. in [place]? --    No data found.  Updated Vital Signs BP Marland Kitchen)  149/96 (BP Location: Right Arm)   Pulse 72   Temp 98.2 F (36.8 C) (Oral)   Resp 18   Ht 5\' 7"  (1.702 m)   Wt 61.2 kg   LMP  (LMP Unknown)   SpO2 100%   BMI 21.14 kg/m   Visual Acuity Right Eye Distance:   Left Eye Distance:   Bilateral Distance:    Right Eye Near:   Left Eye Near:    Bilateral Near:     Physical Exam Vitals signs and nursing note reviewed.  Constitutional:      General: She is not in acute distress.    Appearance: She is not toxic-appearing or diaphoretic.  Eyes:     General: Lids are everted, no foreign bodies appreciated.        Left eye: No foreign body.     Extraocular Movements: Extraocular movements intact.     Conjunctiva/sclera:     Right eye: Right conjunctiva is not injected. No exudate.    Left eye: Left conjunctiva is injected. Exudate (mild) present.     Pupils: Pupils are equal, round, and reactive to light.     Comments: Left upper and lower eyelids edematous and erythematous with mild tenderness  Neurological:     Mental Status: She is alert.      UC Treatments / Results  Labs (all labs ordered are listed, but only abnormal results are displayed) Labs Reviewed - No data to display  EKG   Radiology No results found.  Procedures Procedures (including critical care time)  Medications Ordered in UC Medications - No data to display  Initial Impression / Assessment and Plan / UC Course  I have reviewed the triage vital signs and the nursing notes.  Pertinent  labs & imaging results that were available during my care of the patient were reviewed by me and considered in my medical decision making (see chart for details).      Final Clinical Impressions(s) / UC Diagnoses   Final diagnoses:  Preseptal cellulitis of left eye  Other mucopurulent conjunctivitis of left eye    ED Prescriptions    Medication Sig Dispense Auth. Provider   moxifloxacin (VIGAMOX) 0.5 % ophthalmic solution Place 1 drop into the left eye 3 (three) times daily. 3 mL Norval Gable, MD   sulfamethoxazole-trimethoprim (BACTRIM DS) 800-160 MG tablet Take 1 tablet by mouth 2 (two) times daily. 20 tablet Norval Gable, MD      1. diagnosis reviewed with patient 2. rx as per orders above; reviewed possible side effects, interactions, risks and benefits  3. Recommend supportive treatment with warm compresses to eyelid 4. Follow up with her eye doctor this week 4. Follow-up prn   Controlled Substance Prescriptions [place] Controlled Substance Registry consulted? Not Applicable   Norval Gable, MD 05/16/19 1014

## 2019-05-16 NOTE — ED Triage Notes (Signed)
Patient states last week she had an eye infection that she was treated for. She states she woke up Monday with left eye drainage and facial swelling.

## 2019-05-31 ENCOUNTER — Encounter: Payer: Self-pay | Admitting: Family Medicine

## 2019-05-31 ENCOUNTER — Other Ambulatory Visit: Payer: Self-pay

## 2019-05-31 ENCOUNTER — Ambulatory Visit (INDEPENDENT_AMBULATORY_CARE_PROVIDER_SITE_OTHER): Payer: BLUE CROSS/BLUE SHIELD | Admitting: Family Medicine

## 2019-05-31 ENCOUNTER — Telehealth: Payer: Self-pay | Admitting: Family Medicine

## 2019-05-31 DIAGNOSIS — B029 Zoster without complications: Secondary | ICD-10-CM

## 2019-05-31 DIAGNOSIS — M792 Neuralgia and neuritis, unspecified: Secondary | ICD-10-CM

## 2019-05-31 DIAGNOSIS — F4321 Adjustment disorder with depressed mood: Secondary | ICD-10-CM | POA: Diagnosis not present

## 2019-05-31 MED ORDER — VALACYCLOVIR HCL 1 G PO TABS: 1000.0000 mg | ORAL_TABLET | Freq: Three times a day (TID) | ORAL | 1 refills | Status: DC

## 2019-05-31 MED ORDER — GABAPENTIN 300 MG PO CAPS: 300.0000 mg | ORAL_CAPSULE | Freq: Every evening | ORAL | 0 refills | Status: DC | PRN

## 2019-05-31 NOTE — Telephone Encounter (Signed)
Pt called in to request to have medication sent in to the pharmacy. Pt says that she is having a Outbreak. Pt says that she was  prescribed cyclobenzaprine and a second medication once before when she had concern.

## 2019-05-31 NOTE — Telephone Encounter (Signed)
Having a outbreak of shingles. Need valtrex, gabapentin refill. Please call to [facility] [place] or will she need a virtual appointment. Patient stated she is in a lot of pain

## 2019-05-31 NOTE — Telephone Encounter (Signed)
Needs an appointment - see if she can get on video or send photos via MyChart - see if she can be on this afternoon and I can work her in - just need to see where the outbreak is and how severe.  Thanks.

## 2019-05-31 NOTE — Progress Notes (Signed)
Name: Linda Rhodes   MRN: [id number]    DOB: Oct 08, 1976   Date:05/31/2019       Progress Note  Subjective  Chief Complaint  Chief Complaint  Patient presents with  . Rash    possible shingles, pain, rash on left back side    I connected with  TAJANA CROTTEAU  on 05/31/19 at  2:20 PM EDT by a video enabled telemedicine application and verified that I am speaking with the correct person using two identifiers.  I discussed the limitations of evaluation and management by telemedicine and the availability of in person appointments. The patient expressed understanding and agreed to proceed. Staff also discussed with the patient that there may be a patient responsible charge related to this service. Patient Location: Home Provider Location: Office Additional Individuals present: None  HPI  Pt presents with concern for repeat shingles outbreak.  Last outbreak was in January 2020.  She notes that she started having burning on the LEFT upper ribcage in the posterior area last night, the broke.  No fevers/chills, having significant pain now - will Rx gabapentin with her Valtrex to help with pain.  She has had 2-3 outbreaks of singles in the last year - she has been under a lot of stress lately - her mother had COVID and passed away. Advised will need shingrix vaccine once outbreak has cleared and enough time has passed.    Office Visit from 05/31/2019 in [facility]  PHQ-9 Total Score  0      Patient Active Problem List   Diagnosis Date Noted  . 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 Herpes zoster without complication 78/29/5621  . Elevated blood pressure reading 07/18/2018  . Radiculopathy of lumbar region 12/15/2017  . Status post lumbar laminectomy 12/15/2017  . Intractable chronic migraine without aura and without status migrainosus 08/04/2016  . S/P hysterectomy 05/06/2015    Social History   Tobacco Use  . Smoking status:  Former Smoker    Packs/day: 0.50    Years: 7.00    Pack years: 3.50    Types: Cigarettes    Quit date: 10/19/2015    Years since quitting: 3.6  . Smokeless tobacco: Never Used  Substance Use Topics  . Alcohol use: Yes    Comment: socially - 1x/mo     Current Outpatient Medications:  .  acetaminophen (TYLENOL) 500 MG tablet, Take 1,000 mg by mouth every 6 (six) hours as needed for mild pain., Disp: , Rfl:  .  famotidine (PEPCID) 20 MG tablet, Take 1 tablet (20 mg total) by mouth 2 (two) times daily., Disp: 60 tablet, Rfl: 0 .  metoprolol succinate (TOPROL-XL) 50 MG 24 hr tablet, Take 50 mg by mouth 2 (two) times daily. Take with or immediately following a meal., Disp: , Rfl:  .  spironolactone (ALDACTONE) 100 MG tablet, Take 1 tablet (100 mg total) by mouth 3 (three) times daily., Disp: 90 tablet, Rfl: 3 .  CHANTIX 1 MG tablet, TAKE 1 TABLET BY MOUTH TWICE A DAY (Patient not taking: Reported on 05/31/2019), Disp: 180 tablet, Rfl: 0 .  cyclobenzaprine (FLEXERIL) 5 MG tablet, Take 5 mg by mouth 3 (three) times daily as needed for muscle spasms., Disp: , Rfl:  .  gabapentin (NEURONTIN) 300 MG capsule, Take 1 capsule (300 mg total) by mouth at bedtime as needed. (Patient not taking: Reported on 05/31/2019), Disp: 21 capsule, Rfl: 0 .  moxifloxacin (VIGAMOX) 0.5 % ophthalmic  solution, Place 1 drop into the left eye 3 (three) times daily. (Patient not taking: Reported on 05/31/2019), Disp: 3 mL, Rfl: 0 .  pantoprazole (PROTONIX) 40 MG tablet, Take 1 tablet (40 mg total) by mouth 2 (two) times daily before a meal., Disp: 180 tablet, Rfl: 0 .  sulfamethoxazole-trimethoprim (BACTRIM DS) 800-160 MG tablet, Take 1 tablet by mouth 2 (two) times daily. (Patient not taking: Reported on 05/31/2019), Disp: 20 tablet, Rfl: 0 .  valACYclovir (VALTREX) 1000 MG tablet, Take 1 tablet (1,000 mg total) by mouth 3 (three) times daily. (Patient not taking: Reported on 05/31/2019), Disp: 21 tablet, Rfl: 0 .  varenicline  (CHANTIX STARTING MONTH PAK) 0.5 MG X 11 & 1 MG X 42 tablet, Take one 0.5 mg tablet by mouth once daily for 3 days, then increase to one 0.5 mg tablet twice daily for 4 days, then increase to one 1 mg tablet twice daily. (Patient not taking: Reported on 05/31/2019), Disp: 53 tablet, Rfl: 0  Allergies  Allergen Reactions  . Aspirin Other (See Comments)    Causes burning feeling in her stomach.   . Adhesive [Tape] Rash    And skin tears from stronger tapes    I personally reviewed active problem list, medication list, allergies, notes from last encounter, lab results with the patient/caregiver today.  ROS  Constitutional: Negative for fever or weight change.  Respiratory: Negative for cough and shortness of breath.   Cardiovascular: Negative for chest pain or palpitations.  Gastrointestinal: Negative for abdominal pain, no bowel changes.  Musculoskeletal: Negative for gait problem or joint swelling.  Skin: Negative for rash.  Neurological: Negative for dizziness or headache.  No other specific complaints in a complete review of systems (except as listed in HPI above).   Objective  Virtual encounter, vitals not obtained.  There is no height or weight on file to calculate BMI.  Nursing Note and Vital Signs reviewed.  Physical Exam  Constitutional: Patient appears well-developed and well-nourished. No distress.  HENT: Head: Normocephalic and atraumatic.  Neck: Normal range of motion. Pulmonary/Chest: Effort normal. No respiratory distress. Speaking in complete sentences Neurological: Pt is alert and oriented to person, place, and time. Coordination, speech and gait are normal.  Psychiatric: Patient has a normal mood and affect. behavior is normal. Judgment and thought content normal. Skin: LEFT upper rib cage on the lateral posterior area with confluent and individual vesicles with mild underlying erythema.  No results found for this or any previous visit (from the past 72  hour(s)).  Assessment & Plan  1. Neuropathic pain of chest - gabapentin (NEURONTIN) 300 MG capsule; Take 1 capsule (300 mg total) by mouth at bedtime as needed.  Dispense: 21 capsule; Refill: 0  2. Herpes zoster without complication - valACYclovir (VALTREX) 1000 MG tablet; Take 1 tablet (1,000 mg total) by mouth 3 (three) times daily.  Dispense: 21 tablet; Refill: 1  3. Grieving - Recommend counseling, she declines, feels she is coping with her Mother's loss well considering the circumstances, has support in her family.  -Red flags and when to present for emergency care or RTC including fever >101.8F, chest pain, shortness of breath, new/worsening/un-resolving symptoms, reviewed with patient at time of visit. Follow up and care instructions discussed and provided in AVS. - I discussed the assessment and treatment plan with the patient. The patient was provided an opportunity to ask questions and all were answered. The patient agreed with the plan and demonstrated an understanding of the instructions.  I  provided 13 minutes of non-face-to-face time during this encounter.  Hubbard Hartshorn, FNP

## 2019-07-20 ENCOUNTER — Other Ambulatory Visit: Payer: Self-pay

## 2019-07-20 ENCOUNTER — Ambulatory Visit (INDEPENDENT_AMBULATORY_CARE_PROVIDER_SITE_OTHER): Payer: BLUE CROSS/BLUE SHIELD

## 2019-07-20 ENCOUNTER — Ambulatory Visit
Admission: EM | Admit: 2019-07-20 | Discharge: 2019-07-20 | Disposition: A | Discharge status: Home / Self Care | Payer: BLUE CROSS/BLUE SHIELD | Attending: Urgent Care | Admitting: Urgent Care

## 2019-07-20 ENCOUNTER — Encounter: Payer: Self-pay | Admitting: Emergency Medicine

## 2019-07-20 DIAGNOSIS — S61011A Laceration without foreign body of right thumb without damage to nail, initial encounter: Secondary | ICD-10-CM | POA: Diagnosis not present

## 2019-07-20 DIAGNOSIS — W25XXXA Contact with sharp glass, initial encounter: Secondary | ICD-10-CM | POA: Diagnosis not present

## 2019-07-20 DIAGNOSIS — I10 Essential (primary) hypertension: Secondary | ICD-10-CM

## 2019-07-20 MED ORDER — TRAMADOL HCL 50 MG PO TABS: 50.0000 mg | ORAL_TABLET | Freq: Three times a day (TID) | ORAL | 0 refills | Status: DC | PRN

## 2019-07-20 MED ORDER — LIDOCAINE HCL (PF) 1 % IJ SOLN
5.0000 mL | Freq: Once | INTRAMUSCULAR | Status: AC
  Administered 2019-07-20: 5 mL

## 2019-07-20 MED ORDER — BACITRACIN ZINC 500 UNIT/GM EX OINT
TOPICAL_OINTMENT | Freq: Once | CUTANEOUS | Status: AC
  Administered 2019-07-20: 12:00:00 via TOPICAL

## 2019-07-20 NOTE — Discharge Instructions (Signed)
It was very nice seeing you today in clinic. Thank you for entrusting me with your care.   Keep area clean and dry. Make sure that it is covered when you are in public. Apply antibiotic ointment daily. Monitor for signs and symptoms of infection, which would include increased redness, swelling, streaking, drainage, pain, and the development of a fever.   Return here in 10 days to have your stitches removed. If your symptoms/condition worsens, please seek follow up care either here or in the ER. Please remember, our [place] providers are "right here with you" when you need [place].   Again, it was my pleasure to take care of you today. Thank you for choosing our clinic. I hope that you start to feel better quickly.   Honor Loh, MSN, APRN, FNP-C, CEN Advanced Practice Provider [facility]

## 2019-07-20 NOTE — ED Triage Notes (Signed)
Patient states that she broke her window to get into her house around 7 am this morning.  Patient has laceration to her right thumb.

## 2019-07-20 NOTE — ED Provider Notes (Signed)
[place], [place]   Name: Linda Rhodes DOB: 09/04/1976 MRN: [id number] CSN: [id number] PCP: Hubbard Hartshorn, FNP  Arrival date and time:  07/20/19 1050  Chief Complaint:  Extremity Laceration (right thumb)   NOTE: Prior to seeing the patient today, I have reviewed the triage nursing documentation and vital signs. Clinical staff has updated patient's PMH/PSHx, current medication list, and drug allergies/intolerances to ensure comprehensive history available to assist in medical decision making.   History:   HPI: Linda Rhodes is a 43 y.o. female who presents today with complaints of pain to her RIGHT hand after sustaining a laceration at approximately 0600-0700 this morning. Patient reporting that she locked her keys inside of her home. Her elderly father was inside of the home, however he has advanced dementia. Patient notes that she was concerned for her father's safety, thus she decided to break the window in order to be able to get inside. Patient states, "I didn't think I would hurt myself because I used a stick to do it. I guess I was wrong because I cut myself". Patient presents with a small laceration to her RIGHT thumb. Bleeding controlled upon arrival. Tetanus vaccination status reviewed with patient. Based on her reports, it is determined that she is up to date on her tetanus prophylaxis.   Patient is very anxious about the wound, the need for sutures, and her father being upset by the events that transpired today. Patient presents with an elevated blood pressure. She has not taken her antihypertensive medication yet today. She denies any chest pain, shortness of breath, palpitations, headaches, or visual changes.   Past Medical History:  Diagnosis Date   Abscessed tooth 09/24/2016   right lower tooth removed.   Anemia    in past, prior to removal of uterine fibroids   Bladder infection    Difficult intubation    GERD (gastroesophageal reflux disease)    Headache    migrains   Hypertension    Motion sickness    all moving vehicles   Ovarian cyst    PONV (postoperative nausea and vomiting)     Past Surgical History:  Procedure Laterality Date   ABDOMINAL HYSTERECTOMY     BACK SURGERY  2016   [facility] - ruptured L4-5   CESAREAN SECTION     CHOLECYSTECTOMY     CYSTOSCOPY N/A 05/06/2015   Procedure: CYSTOSCOPY;  Surgeon: Aletha Halim, MD;  Location: [facility];  Service: Gynecology;  Laterality: N/A;   ESOPHAGOGASTRODUODENOSCOPY (EGD) WITH PROPOFOL N/A 04/13/2018   Procedure: ESOPHAGOGASTRODUODENOSCOPY (EGD) WITH PROPOFOL;  Surgeon: Lin Landsman, MD;  Location: [facility]COPY;  Service: Gastroenterology;  Laterality: N/A;   ETHMOIDECTOMY Right 11/25/2015   Procedure:  ANTERIOR ETHMOIDECTOMY;  Surgeon: Carloyn Manner, MD;  Location: [place];  Service: ENT;  Laterality: Right;   FRONTAL SINUS EXPLORATION Right 11/25/2015   Procedure: FRONTAL SINUSOTOMY;  Surgeon: Carloyn Manner, MD;  Location: [place];  Service: ENT;  Laterality: Right;   IMAGE GUIDED SINUS SURGERY N/A 11/25/2015   Procedure: IMAGE GUIDED SINUS SURGERY WITH BALLOON;  Surgeon: Carloyn Manner, MD;  Location: [place];  Service: ENT;  Laterality: N/A;   LAPAROSCOPIC HYSTERECTOMY N/A 05/06/2015   Procedure: HYSTERECTOMY TOTAL LAPAROSCOPIC;  Surgeon: Aletha Halim, MD;  Location: [facility];  Service: Gynecology;  Laterality: N/A;   LUMBAR LAMINECTOMY/DECOMPRESSION MICRODISCECTOMY N/A 10/04/2016   Procedure: right minimally invasive L5 S1 microdiscectomy;  Surgeon: Bayard Hugger, MD;  Location: [facility];  Service: Neurosurgery;  Laterality: N/A;   MAXILLARY ANTROSTOMY Right 11/25/2015   Procedure: MAXILLARY ANTROSTOMY WITH TISSUE REMOVAL;  Surgeon: Carloyn Manner, MD;  Location: [place];  Service: ENT;  Laterality: Right;   WISDOM TOOTH EXTRACTION  10/2115   3 teeth removed.    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   pantoprazole (PROTONIX) 40 MG tablet Take 1 tablet (40 mg total) by mouth 2 (two) times daily before a meal.   spironolactone (ALDACTONE) 100 MG tablet Take 1 tablet (100 mg total) by mouth 3 (three) times daily.    Allergies:   Aspirin and Adhesive [tape]  Review of Systems (ROS): Review of Systems  Constitutional: Negative for chills and fever.  Eyes: Negative for visual disturbance.  Respiratory: Negative for cough and shortness of breath.   Cardiovascular: Negative for chest pain and palpitations.  Skin: Positive for wound.  Neurological: Negative for dizziness, syncope, weakness and headaches.  Psychiatric/Behavioral: The patient is nervous/anxious.   All other systems reviewed and are negative.    Vital Signs: Today's Vitals   07/20/19 1115 07/20/19 1118 07/20/19 1219 07/20/19 1237  BP:  (S) (!) 173/116 (!) 178/110   Pulse:  86    Resp:  16    Temp:  98 F (36.7  C)    TempSrc:  Oral    SpO2:  98%    Weight: 135 lb (61.2 kg)     Height: 5\' 7"  (1.702 m)     PainSc: 8    8     Physical Exam: Physical Exam  Constitutional: She is oriented to person, place, and time and well-developed, well-nourished, and in no distress. No distress.  HENT:  Head: Normocephalic and atraumatic.  Nose: Nose normal.  Mouth/Throat: Oropharynx is clear and moist.  Eyes: Pupils are equal, round, and reactive to light.  Cardiovascular: Normal rate, regular rhythm, normal heart sounds and intact distal pulses. Exam reveals no gallop and no friction rub.  No murmur heard. Pulmonary/Chest: Effort normal and breath sounds normal. No respiratory distress. She has no wheezes. She has no rales.  Musculoskeletal: Normal range of motion.     Right hand: She exhibits laceration (1.5 cm adjacent to the dorsal surface of the 1st MCP joint; bleeding controlled).     Comments: RIGHT THUMB: (+) TTP; no signs of infection. Capillary refill WNL. Digit color  and temperature normal. Patient has full AROM in the digit; tested against opposition. No evidence of significant vascular or tendon injury. Sensation intact. Finger/thumb opposition normal.   Neurological: She is alert and oriented to person, place, and time. She has normal sensation.  Skin: Skin is warm and dry. No rash noted. She is not diaphoretic.  Psychiatric: Memory, affect and judgment normal. Her mood appears anxious.  Nursing note and vitals reviewed.   Urgent Care Treatments / Results:   LABS: PLEASE NOTE: all labs that were ordered this encounter are listed, however only abnormal results are displayed. Labs Reviewed - No data to display  EKG: -None  RADIOLOGY: Dg Finger Thumb Right  Result Date: 07/20/2019 CLINICAL DATA:  Cut thumb on glass with laceration EXAM: RIGHT THUMB 2+V COMPARISON:  None. FINDINGS: Soft tissue laceration is noted adjacent to the first MCP joint. No radiopaque foreign body is noted. No bony  abnormality is seen. IMPRESSION: Soft tissue injury without acute bony abnormality. No foreign body is seen. Electronically Signed   By: Inez Catalina M.D.   On: 07/20/2019 11:42   PROCEDURES: Laceration Repair Performed by: Karen Kitchens, NP Authorized by: Karen Kitchens, NP   Consent:    Consent obtained:  Verbal   Consent given by:  Patient   Risks discussed:  Infection, need for additional repair, pain, poor cosmetic result, poor wound healing, retained foreign body, tendon damage, nerve damage and vascular damage   Alternatives discussed:  No treatment, delayed treatment and referral Universal protocol:    Procedure explained and questions answered to patient or proxy's satisfaction: yes     Imaging studies available: yes     Patient identity confirmed:  Verbally with patient Anesthesia (see MAR for exact dosages):    Anesthesia method:  Local infiltration   Local anesthetic:  Lidocaine 1% w/o epi Laceration details:    Location:  Finger   Finger location:  R thumb   Length (cm):  1.5 Repair type:    Repair type:  Simple Pre-procedure details:    Preparation:  Patient was prepped and draped in usual sterile fashion and imaging obtained to evaluate for foreign bodies Exploration:    Hemostasis achieved with:  Direct pressure   Wound exploration: wound explored through full range of motion and entire depth of wound probed and visualized     Wound extent: no foreign bodies/material noted, no muscle damage noted, no nerve damage noted, no tendon damage noted, no underlying fracture noted and no vascular damage noted     Contaminated: no   Treatment:    Area cleansed with:  Betadine, Hibiclens and saline   Amount of cleaning:  Standard   Irrigation solution:  Sterile saline   Irrigation method:  Pressure wash and syringe   Visualized foreign bodies/material removed: no   Skin repair:    Repair method:  Sutures   Suture size:  5-0   Suture material:  Nylon   Suture technique:   Simple interrupted   Number of sutures:  4 Post-procedure details:    Dressing:  Antibiotic ointment, non-adherent dressing and splint for protection   Patient tolerance of procedure:  Tolerated well, no immediate complications    MEDICATIONS RECEIVED THIS VISIT: Medications  lidocaine (PF) (XYLOCAINE) 1 % injection 5 mL (5 mLs Infiltration Given 07/20/19 1212)  bacitracin ointment ( Topical Given 07/20/19 1211)    PERTINENT CLINICAL COURSE NOTES/UPDATES:   Initial Impression / Assessment and Plan / Urgent Care Course:  Pertinent labs &  imaging results that were available during my care of the patient were personally reviewed by me and considered in my medical decision making (see lab/imaging section of note for values and interpretations).  ANEITA ARBELO is a 43 y.o. female who presents to [facility] today with complaints of simple laceration to her RIGHT thumb.   Patient is well appearing overall in clinic today. She does not appear to be in any acute distress. Presenting symptoms (see HPI) and exam as documented above. Laceration repaired as per above procedure note. Patient tolerated well. Tetanus UTD. Wound cleansed and dressed in clinic by staff. Splint applied for suture protection due to location of wound. Discussed daily wound care and TAO application. Patient to keep wound covered while in public. Patient educated on need to monitor for signs and symptoms of infection, which would include increased redness, swelling, streaking, drainage, pain, and the development of a fever. Wound is "throbbing" and patient reporting that she has already taken APAP + IBU without relief. She is asking for something more potent. Will send in a short course of Tramadol for PRN use. Patient to RTC in 10 days to have sutures removed. She was encouraged to return a call to the clinic with any questions or concerns that arise between now and when she comes to have sutures removed.   Discussed HTN.  Blood pressure elevated in clinic today. She is very anxious about this morning's events leading to her injury. She has not taken her prescribed antihypertensives. Patient has no symptoms at present. Encouraged to take BP medication as soon as she got home; verbalized understanding.   Discussed follow up with primary care physician in 1 week for re-evaluation. I have reviewed the follow up and strict return precautions for any new or worsening symptoms. Patient is aware of symptoms that would be deemed urgent/emergent, and would thus require further evaluation either here or in the emergency department. At the time of discharge, she verbalized understanding and consent with the discharge plan as it was reviewed with her. All questions were fielded by provider and/or clinic staff prior to patient discharge.    Final Clinical Impressions / Urgent Care Diagnoses:   Final diagnoses:  Laceration of right thumb without foreign body without damage to nail, initial encounter    New Prescriptions:  [place] Controlled Substance Registry consulted? Yes, I have consulted the [place] Controlled Substances Registry for this patient, and feel the risk/benefit ratio today is favorable for proceeding with this prescription for a controlled substance.   Discussed use of controlled substance medication to treat her acute pain.  o Reviewed [place] STOP Act regulations  o Clinic does not refill controlled substances over the phone without face to face evaluation.   Safety precautions reviewed.  o Medications should not be bitten, chewed, crushed, shared, or taken with alcohol.  o Avoid use while working, driving, or operating heavy machinery.  o Side effects associated with the use of this particular medication reviewed. - Patient understands that this medication can cause CNS depression, increase her risk of falls, and even lead to overdose that may result in death, if used outside of the parameters that she and I discussed.    With all of this in mind, she knowingly accepts the risks and responsibilities associated with intended course of treatment, and elects to responsibly proceed as discussed.  Meds ordered this encounter  Medications   lidocaine (PF) (XYLOCAINE) 1 % injection 5 mL   bacitracin ointment   traMADol (  ULTRAM) 50 MG tablet    Sig: Take 1 tablet (50 mg total) by mouth every 8 (eight) hours as needed.    Dispense:  12 tablet    Refill:  0    Recommended Follow up Care:  Patient encouraged to follow up with the following provider within the specified time frame, or sooner as dictated by the severity of her symptoms. As always, she was instructed that for any urgent/emergent care needs, she should seek care either here or in the emergency department for more immediate evaluation.  Follow-up Information    [place] In 10 days.   Specialty: Urgent Care Why: For suture removal Contact information: [address] [place] [phone]        NOTE: This note was prepared using Dragon dictation software along with smaller phrase technology. Despite my best ability to proofread, there is the potential that transcriptional errors may still occur from this process, and are completely unintentional.     Karen Kitchens, NP 07/21/19 0015

## 2019-07-30 ENCOUNTER — Encounter: Payer: Self-pay | Admitting: Emergency Medicine

## 2019-07-30 ENCOUNTER — Ambulatory Visit
Admission: EM | Admit: 2019-07-30 | Discharge: 2019-07-30 | Disposition: A | Discharge status: Home / Self Care | Payer: BLUE CROSS/BLUE SHIELD

## 2019-07-30 DIAGNOSIS — Z4802 Encounter for removal of sutures: Secondary | ICD-10-CM

## 2019-07-30 NOTE — ED Triage Notes (Addendum)
Pt here for suture removal of right thumb. No complaints reported.   Patient presents for suture removal. The wound is well healed without signs of infection.  The sutures are removed. Wound care and activity instructions given. Return prn.  5 Sutures removed without problem.

## 2019-08-03 ENCOUNTER — Ambulatory Visit: Payer: Self-pay

## 2019-08-03 NOTE — Telephone Encounter (Signed)
Pt. Reports her BP has been "running high for 2 weeks." Reports she has been taking her medication as prescribed, no missed doses. Reports she has increased stress as well - has recently lost her mother. Does have a headache today. BP this morning 170/100. Instructed if symptoms worsen to go to [facility] or ED. Verbalizes understanding. Warm transfer to Cassandra in the practice for a visit.  Answer Assessment - Initial Assessment Questions 1. BLOOD PRESSURE: "What is the blood pressure?" "Did you take at least two measurements 5 minutes apart?"     170/100 2. ONSET: "When did you take your blood pressure?"     0800 3. HOW: "How did you obtain the blood pressure?" (e.g., visiting nurse, automatic home BP monitor)     Home monitor 4. HISTORY: "Do you have a history of high blood pressure?"     Yes 5. MEDICATIONS: "Are you taking any medications for blood pressure?" "Have you missed any doses recently?"     No missed doses 6. OTHER SYMPTOMS: "Do you have any symptoms?" (e.g., headache, chest pain, blurred vision, difficulty breathing, weakness)     Headache, no energy 7. PREGNANCY: "Is there any chance you are pregnant?" "When was your last menstrual period?"     No  Protocols used: HIGH BLOOD PRESSURE-A-AH

## 2019-08-03 NOTE — Telephone Encounter (Signed)
Patient has appointment set up for Monday. Was advised to go to UC or ER for BP being elevated

## 2019-08-03 NOTE — Telephone Encounter (Signed)
Agree with ER if headache worsens or if any changes in neurologic status - weakness, numbness/tingling, confusion, slurred speech, shortness of breath, chest pain.

## 2019-08-06 ENCOUNTER — Other Ambulatory Visit: Payer: Self-pay

## 2019-08-06 ENCOUNTER — Ambulatory Visit (INDEPENDENT_AMBULATORY_CARE_PROVIDER_SITE_OTHER): Payer: BLUE CROSS/BLUE SHIELD | Admitting: Family Medicine

## 2019-08-06 ENCOUNTER — Encounter: Payer: Self-pay | Admitting: Family Medicine

## 2019-08-06 VITALS — BP 152/98 | HR 88 | Temp 97.3°F | Resp 16 | Ht 67.0 in | Wt 136.4 lb

## 2019-08-06 DIAGNOSIS — E2609 Other primary hyperaldosteronism: Secondary | ICD-10-CM | POA: Diagnosis not present

## 2019-08-06 DIAGNOSIS — F4321 Adjustment disorder with depressed mood: Secondary | ICD-10-CM

## 2019-08-06 DIAGNOSIS — I10 Essential (primary) hypertension: Secondary | ICD-10-CM

## 2019-08-06 MED ORDER — AMLODIPINE BESYLATE 5 MG PO TABS: 5.0000 mg | ORAL_TABLET | Freq: Every day | ORAL | 3 refills | Status: DC

## 2019-08-06 MED ORDER — ESCITALOPRAM OXALATE 10 MG PO TABS: ORAL_TABLET | ORAL | 1 refills | Status: DC

## 2019-08-06 MED ORDER — HYDROXYZINE HCL 10 MG PO TABS: 10.0000 mg | ORAL_TABLET | Freq: Three times a day (TID) | ORAL | 0 refills | Status: DC | PRN

## 2019-08-06 MED ORDER — METOPROLOL SUCCINATE ER 50 MG PO TB24: 50.0000 mg | ORAL_TABLET | Freq: Every day | ORAL | 1 refills | Status: DC

## 2019-08-06 NOTE — Telephone Encounter (Signed)
Patient on schedule for appointment today

## 2019-08-06 NOTE — Patient Instructions (Signed)
Here are some resources to help you if you feel you are in a mental health crisis:  [place] - Call [phone]  for help - Website with more resources: GripTrip.com.pt  [employer] - Call [phone] for help. - Mobile Crisis Program available 24 hours a day, 365 days a year. - Available for anyone of any age in [place] counties.  [facility] - Address: [address], [place] - Telephone: [phone]  - Hours of Operation: Sunday - Saturday - 8:00 a.m. - 8:00 p.m. - Medicaid, Medicare (Government Issued Only), BCBS, and [facility], [place], Psychiatrists on-site to provide medication management, [place], and Peer Support Care.  National Mobile Crisis: [phone] - [place] available 24 hours a day, 365 days a year. - Available for anyone of any age in [place]

## 2019-08-06 NOTE — Progress Notes (Signed)
Name: Linda Rhodes   MRN: [id number]    DOB: 1976/09/18   Date:08/06/2019       Progress Note  Subjective  Chief Complaint  Chief Complaint  Patient presents with  . Hypertension    elevated BP for 2 weeks, seen at [facility]. Patient is greiving the death of her mother in April 04, 2023 from [place]  . Headache    HPI  PT presents for concern for hypertension. Her BP has been elevated for the last 2 weeks int he 150-160's/90-100's.  She has been grieving the loss of her mother to [place] in 04-04-2019, and more recently the loss of a cousin and uncle to COVID-18.  She is tearful in office, states she has not been sleeping, is going to work, having panic attacks sometimes when she is reminded of her mom's death.  She endorses headaches some days; has very rare chest pain that is brief and usually with emotional distress, occasional dependent edema.  Denies shortness of breath, confusion, double vision, no extremity weakness, facial droop.  She does note that she will sometimes have visual floaters with headaches.  Has been out of metoprolol for about 3 days - she was taking metoprolol 50mg  BID and spironolactone 100mg  TID.    RIGHT thumb decreased ROM: Had lacteration repair at the base of the thumb a few weeks ago and is now having significant decrease in AROM - very minimal flexion.   Patient Active Problem List   Diagnosis Date Noted  . Hyperaldosteronism with hyperplasia of adrenal cortex ([place]) 10/06/2018  . Herpes zoster without complication [id number]  . Elevated blood pressure reading 07/18/2018  . Radiculopathy of lumbar region 12/15/2017  . Status post lumbar laminectomy 12/15/2017  . Intractable chronic migraine without aura and without status migrainosus 08/04/2016  . S/P hysterectomy 05/06/2015   Social History   Tobacco Use  . Smoking status: Former Smoker    Packs/day: 0.50    Years: 7.00    Pack years: 3.50    Types: Cigarettes    Quit date: 10/19/2015    Years since quitting: 3.8  .  Smokeless tobacco: Never Used  Substance Use Topics  . Alcohol use: Yes    Comment: socially - 1x/mo    Current Outpatient Medications:  .  acetaminophen (TYLENOL) 500 MG tablet, Take 1,000 mg by mouth every 6 (six) hours as needed for mild pain., Disp: , Rfl:  .  famotidine (PEPCID) 20 MG tablet, Take 1 tablet (20 mg total) by mouth 2 (two) times daily., Disp: 60 tablet, Rfl: 0 .  spironolactone (ALDACTONE) 100 MG tablet, Take 1 tablet (100 mg total) by mouth 3 (three) times daily., Disp: 90 tablet, Rfl: 3 .  valACYclovir (VALTREX) 1000 MG tablet, Take 1 tablet (1,000 mg total) by mouth 3 (three) times daily., Disp: 21 tablet, Rfl: 1 .  pantoprazole (PROTONIX) 40 MG tablet, Take 1 tablet (40 mg total) by mouth 2 (two) times daily before a meal., Disp: 180 tablet, Rfl: 0 .  traMADol (ULTRAM) 50 MG tablet, Take 1 tablet (50 mg total) by mouth every 8 (eight) hours as needed., Disp: 12 tablet, Rfl: 0  Allergies  Allergen Reactions  . Aspirin Other (See Comments)    Causes burning feeling in her stomach.   . Adhesive [Tape] Rash    And skin tears from stronger tapes    I personally reviewed active problem list, medication list, allergies, notes from last encounter, lab results with the patient/caregiver today.  ROS  Ten systems reviewed and is negative except as mentioned in HPI  Objective  Vitals:   08/06/19 1353  BP: (!) 152/98  Pulse: 88  Resp: 16  Temp: (!) 97.3 F (36.3 C)  TempSrc: Temporal  SpO2: 99%  Weight: 136 lb 6.4 oz (61.9 kg)  Height: 5\' 7"  (1.702 m)   Body mass index is 21.36 kg/m.  Nursing Note and Vital Signs reviewed.  Physical Exam  Constitutional: Patient appears well-developed and well-nourished. No distress.  HENT: Head: Normocephalic and atraumatic.  Eyes: Conjunctivae and EOM are normal. No scleral icterus.   Neck: Normal range of motion. Neck supple. No JVD present.  Cardiovascular: Normal rate, regular rhythm and normal heart sounds.  No  murmur heard. No BLE edema. Pulmonary/Chest: Effort normal and breath sounds normal. No respiratory distress. Abdominal: Soft. Bowel sounds are normal, no distension. There is no tenderness. No masses. Musculoskeletal: The right thumb exhibits minimal flexion, full extension, some tenderness over the well healed laceration. No gross deformities. Neurological: Pt is alert and oriented to person, place, and time. No cranial nerve deficit. Coordination, balance, strength, speech and gait are normal.  Skin: Skin is warm and dry. No rash noted. No erythema.  Psychiatric: Patient has a anxious and depressed mood and affect. behavior is appropriate. Judgment and thought content appropriate.  Denies SI/HI.  No results found for this or any previous visit (from the past 72 hour(s)).  Assessment & Plan  1. Essential hypertension - Stroke symptoms reviewed in detail; she will go to ER if these occur.  BP elevation likely secondary to intense grief reaction that she is going through at this time, medication may be temporary.  - amLODipine (NORVASC) 5 MG tablet; Take 1 tablet (5 mg total) by mouth daily.  Dispense: 90 tablet; Refill: 3 - BASIC METABOLIC PANEL WITH GFR - metoprolol succinate (TOPROL-XL) 50 MG 24 hr tablet; Take 1 tablet (50 mg total) by mouth at bedtime. Take with or immediately following a meal.  Dispense: 90 tablet; Refill: 1  2. Grief reaction - escitalopram (LEXAPRO) 10 MG tablet; Take 1/2 tablet once daily for 7 days, then increase to 1 tablet once daily  Dispense: 30 tablet; Refill: 1 - hydrOXYzine (ATARAX/VISTARIL) 10 MG tablet; Take 1 tablet (10 mg total) by mouth 3 (three) times daily as needed for anxiety.  Dispense: 30 tablet; Refill: 0 - Ambulatory referral to Chronic Care Management Services  3. Hyperaldosteronism with hyperplasia of adrenal cortex (HCC) - Spironolactone as prescribed.  Face-to-face time with patient was more than 25 minutes, >50% time spent counseling and  coordination of care

## 2019-08-07 LAB — BASIC METABOLIC PANEL WITH GFR
BUN/Creatinine Ratio: 8 (calc) (ref 6–22)
BUN: 6 mg/dL — ABNORMAL LOW (ref 7–25)
CO2: 27 mmol/L (ref 20–32)
Calcium: 9.2 mg/dL (ref 8.6–10.2)
Chloride: 104 mmol/L (ref 98–110)
Creat: 0.79 mg/dL (ref 0.50–1.10)
GFR, Est African American: 108 mL/min/{1.73_m2} (ref >=60)
GFR, Est Non African American: 93 mL/min/{1.73_m2} (ref >=60)
Glucose, Bld: 98 mg/dL (ref 65–99)
Potassium: 3.7 mmol/L (ref 3.5–5.3)
Sodium: 140 mmol/L (ref 135–146)

## 2019-08-14 ENCOUNTER — Ambulatory Visit: Payer: Self-pay | Admitting: *Deleted

## 2019-08-14 NOTE — Chronic Care Management (AMB) (Signed)
Care Management   Unsuccessful Call Note 08/14/2019 Name: Linda Rhodes MRN: [id number] DOB: [birth year]  Patient  is a 43 year old female who sees [name] FNP for primary care. [name], FNP asked the CCM team to consult the patient for grief counseling.  Referral was placed 08/06/19. Patient's last office visit was 08/06/19.     This social worker was unable to reach patient via telephone today to discuss CM services and offer support. I have left HIPAA compliant voicemail asking patient to return my call. (unsuccessful outreach #1).   Plan: Will follow-up within 7 business days via telephone.     Elliot Gurney, [place] Administrator, [facility] [phone]

## 2019-08-16 ENCOUNTER — Telehealth: Payer: Self-pay

## 2019-08-16 DIAGNOSIS — S61011D Laceration without foreign body of right thumb without damage to nail, subsequent encounter: Secondary | ICD-10-CM

## 2019-08-16 NOTE — Telephone Encounter (Signed)
Copied from [place] [id number]. Topic: General - Other >> Aug 16, 2019  8:56 AM Antonieta Iba C wrote: Reason for CRM: pt called in to follow up on referral to otho for her right thumb. Pt says that she was told by provider that due to injury she would need to see a specialist, pt says that she has not received a call from anyone. Pt would like to have a referral in [place] because its where she lives.   CB: [phone]-- please assist pt directly.   No referral was placed. Please advise

## 2019-08-16 NOTE — Telephone Encounter (Signed)
Had laceration of the right thumb with repair in ER.  Referral is placed for hand specialty evaluation.

## 2019-08-17 ENCOUNTER — Telehealth: Payer: Self-pay

## 2019-08-17 NOTE — Telephone Encounter (Signed)
Copied from [place] [id number]. Topic: Referral - Question >> Aug 17, 2019  2:54 PM Rayann Heman wrote: Reason for CRM: Benita calling from [facility] ortho called and stated that do not do hand surgery and the pt will need to be referred to someone else.

## 2019-08-19 ENCOUNTER — Other Ambulatory Visit: Payer: Self-pay

## 2019-08-19 ENCOUNTER — Ambulatory Visit
Admission: EM | Admit: 2019-08-19 | Discharge: 2019-08-19 | Disposition: A | Discharge status: Home / Self Care | Payer: BLUE CROSS/BLUE SHIELD | Attending: Family Medicine | Admitting: Family Medicine

## 2019-08-19 ENCOUNTER — Encounter: Payer: Self-pay | Admitting: Emergency Medicine

## 2019-08-19 DIAGNOSIS — R197 Diarrhea, unspecified: Secondary | ICD-10-CM | POA: Diagnosis not present

## 2019-08-19 DIAGNOSIS — R112 Nausea with vomiting, unspecified: Secondary | ICD-10-CM | POA: Diagnosis not present

## 2019-08-19 DIAGNOSIS — K529 Noninfective gastroenteritis and colitis, unspecified: Secondary | ICD-10-CM

## 2019-08-19 MED ORDER — PROMETHAZINE HCL 25 MG PO TABS: 25.0000 mg | ORAL_TABLET | Freq: Three times a day (TID) | ORAL | 0 refills | Status: DC | PRN

## 2019-08-19 NOTE — ED Provider Notes (Signed)
[facility]    CSN: [id number] Arrival date & time: 08/19/19  1430      History   Chief Complaint Chief Complaint  Patient presents with  . Nausea  . Emesis  . Diarrhea   HPI  43 year old female presents with nausea, vomiting, diarrhea.  Started abruptly earlier this morning.  Started around 2 AM.  She reports nausea, vomiting, diarrhea.  She states that she has had approximately 10 episodes of emesis.  She has been unable to keep anything down.  No fever.  No chills.  No medications or interventions tried.  No abdominal pain.  No known exacerbating relieving factors.  No other complaints.  PMH, Surgical Hx, Family Hx, Social History reviewed and updated as below.  Past Medical History:  Diagnosis Date  . Abscessed tooth 09/24/2016   right lower tooth removed.  . Anemia    in past, prior to removal of uterine fibroids  . Bladder infection   . Difficult intubation   . GERD (gastroesophageal reflux disease)   . Headache    migrains  . Hypertension   . Motion sickness    all moving vehicles  . Ovarian cyst   . PONV (postoperative nausea and vomiting)     Patient Active Problem List   Diagnosis Date Noted  . Hyperaldosteronism with hyperplasia of adrenal cortex ([place]) 10/06/2018  . Herpes zoster without complication [id number]  . Elevated blood pressure reading 07/18/2018  . Radiculopathy of lumbar region 12/15/2017  . Status post lumbar laminectomy 12/15/2017  . Intractable chronic migraine without aura and without status migrainosus 08/04/2016  . S/P hysterectomy 05/06/2015    Past Surgical History:  Procedure Laterality Date  . ABDOMINAL HYSTERECTOMY    . BACK SURGERY  2016   [facility]surgery - ruptured L4-5  . CESAREAN SECTION    . CHOLECYSTECTOMY    . CYSTOSCOPY N/A 05/06/2015   Procedure: CYSTOSCOPY;  Surgeon: Aletha Halim, MD;  Location: [facility] ORS;  Service: Gynecology;  Laterality: N/A;  . ESOPHAGOGASTRODUODENOSCOPY (EGD) WITH  PROPOFOL N/A 04/13/2018   Procedure: ESOPHAGOGASTRODUODENOSCOPY (EGD) WITH PROPOFOL;  Surgeon: Lin Landsman, MD;  Location[facility] END[facility]COPY;  Service: Gastroenterology;  Laterality: N/A;  . ETHMOIDECTOMY Right 11/25/2015   Procedure:  ANTERIOR ETHMOIDECTOMY;  Surgeon: Carloyn Manner, MD;  Location: [place];  Service: ENT;  Laterality: Right;  . FRONTAL SINUS EXPLORATION Right 11/25/2015   Procedure: FRONTAL SINUSOTOMY;  Surgeon: Carloyn Manner, MD;  Location: [place];  Service: ENT;  Laterality: Right;  . IMAGE GUIDED SINUS SURGERY N/A 11/25/2015   Procedure: IMAGE GUIDED SINUS SURGERY WITH BALLOON;  Surgeon: Carloyn Manner, MD;  Location: [place];  Service: ENT;  Laterality: N/A;  . LAPAROSCOPIC HYSTERECTOMY N/A 05/06/2015   Procedure: HYSTERECTOMY TOTAL LAPAROSCOPIC;  Surgeon: Aletha Halim, MD;  Location: [facility] ORS;  Service: Gynecology;  Laterality: N/A;  . LUMBAR LAMINECTOMY/DECOMPRESSION MICRODISCECTOMY N/A 10/04/2016   Procedure: right minimally invasive L5 S1 microdiscectomy;  Surgeon: Bayard Hugger, MD;  Location: [facility] ORS;  Service: Neurosurgery;  Laterality: N/A;  . MAXILLARY ANTROSTOMY Right 11/25/2015   Procedure: MAXILLARY ANTROSTOMY WITH TISSUE REMOVAL;  Surgeon: Carloyn Manner, MD;  Location: [place];  Service: ENT;  Laterality: Right;  . WISDOM TOOTH EXTRACTION  10/2115   3 teeth removed.    OB History   No obstetric history on file.      Home Medications    Prior to Admission medications   Medication Sig Start Date End Date Taking? Authorizing Provider  amLODipine (NORVASC) 5 MG tablet Take 1 tablet (5 mg total) by mouth daily. 08/06/19  Yes Hubbard Hartshorn, FNP  escitalopram (LEXAPRO) 10 MG tablet Take 1/2 tablet once daily for 7 days, then increase to 1 tablet once daily 08/06/19  Yes Hubbard Hartshorn, FNP  hydrOXYzine (ATARAX/VISTARIL) 10 MG tablet Take 1 tablet (10 mg total) by mouth 3 (three) times daily as needed for  anxiety. 08/06/19  Yes Hubbard Hartshorn, FNP  metoprolol succinate (TOPROL-XL) 50 MG 24 hr tablet Take 1 tablet (50 mg total) by mouth at bedtime. Take with or immediately following a meal. 08/06/19  Yes Hubbard Hartshorn, FNP  pantoprazole (PROTONIX) 40 MG tablet Take 1 tablet (40 mg total) by mouth 2 (two) times daily before a meal. 08/18/18 08/19/19 Yes Vanga, Tally Due, MD  spironolactone (ALDACTONE) 100 MG tablet Take 1 tablet (100 mg total) by mouth 3 (three) times daily. 11/14/18  Yes Poulose, Bethel Born, NP  acetaminophen (TYLENOL) 500 MG tablet Take 1,000 mg by mouth every 6 (six) hours as needed for mild pain.    [provider]  promethazine (PHENERGAN) 25 MG tablet Take 1 tablet (25 mg total) by mouth every 8 (eight) hours as needed for nausea or vomiting. 08/19/19   Coral Spikes, DO  valACYclovir (VALTREX) 1000 MG tablet Take 1 tablet (1,000 mg total) by mouth 3 (three) times daily. 05/31/19   Hubbard Hartshorn, FNP  famotidine (PEPCID) 20 MG tablet Take 1 tablet (20 mg total) by mouth 2 (two) times daily. 03/25/18 08/19/19  Carrie Mew, MD  gabapentin (NEURONTIN) 300 MG capsule Take 1 capsule (300 mg total) by mouth at bedtime as needed. 05/31/19 07/20/19  Hubbard Hartshorn, FNP    Family History Family History  Problem Relation Age of Onset  . Diabetes Mother   . Hypertension Mother   . Diabetes Father   . Hypertension Father     Social History Social History   Tobacco Use  . Smoking status: Former Smoker    Packs/day: 0.50    Years: 7.00    Pack years: 3.50    Types: Cigarettes    Quit date: 10/19/2015    Years since quitting: 3.8  . Smokeless tobacco: Never Used  Substance Use Topics  . Alcohol use: Yes    Comment: socially - 1x/mo  . Drug use: No     Allergies   Aspirin and Adhesive [tape]   Review of Systems Review of Systems  Constitutional: Positive for appetite change. Negative for fever.  Gastrointestinal: Positive for diarrhea, nausea and vomiting.  Negative for abdominal pain.   Physical Exam Triage Vital Signs ED Triage Vitals  Enc Vitals Group     BP 08/19/19 1446 (!) 151/112     Pulse Rate 08/19/19 1446 94     Resp 08/19/19 1446 14     Temp 08/19/19 1446 98.1 F (36.7 C)     Temp Source 08/19/19 1446 Oral     SpO2 08/19/19 1446 97 %     Weight 08/19/19 1443 135 lb (61.2 kg)     Height 08/19/19 1443 5\' 7"  (1.702 m)     Head Circumference --      Peak Flow --      Pain Score 08/19/19 1443 5     Pain Loc --      Pain Edu? --      Excl. in [place]? --    Updated Vital Signs BP (!) 151/112 (BP Location: Right  Arm)   Pulse 94   Temp 98.1 F (36.7 C) (Oral)   Resp 14   Ht 5\' 7"  (1.702 m)   Wt 61.2 kg   LMP  (LMP Unknown)   SpO2 97%   BMI 21.14 kg/m   Visual Acuity Right Eye Distance:   Left Eye Distance:   Bilateral Distance:    Right Eye Near:   Left Eye Near:    Bilateral Near:     Physical Exam Vitals signs and nursing note reviewed.  Constitutional:      Comments: Appears fatigued but in no acute distress.  HENT:     Head: Normocephalic and atraumatic.  Eyes:     General:        Right eye: No discharge.        Left eye: No discharge.     Conjunctiva/sclera: Conjunctivae normal.  Cardiovascular:     Rate and Rhythm: Normal rate and regular rhythm.     Heart sounds: No murmur.  Pulmonary:     Effort: Pulmonary effort is normal.     Breath sounds: Normal breath sounds. No wheezing, rhonchi or rales.  Abdominal:     General: There is no distension.     Palpations: Abdomen is soft.     Tenderness: There is no abdominal tenderness.  Neurological:     Mental Status: She is alert.  Psychiatric:        Mood and Affect: Mood normal.        Behavior: Behavior normal.    UC Treatments / Results  Labs (all labs ordered are listed, but only abnormal results are displayed) Labs Reviewed - No data to display  EKG   Radiology No results found.  Procedures Procedures (including critical care time)   Medications Ordered in UC Medications - No data to display  Initial Impression / Assessment and Plan / UC Course  I have reviewed the triage vital signs and the nursing notes.  Pertinent labs & imaging results that were available during my care of the patient were reviewed by me and considered in my medical decision making (see chart for details).    43 year old female presents with nausea, vomiting, diarrhea.  Suspect gastroenteritis.  Phenergan as directed.  Push fluids. Supportive care.   Final Clinical Impressions(s) / UC Diagnoses   Final diagnoses:  Gastroenteritis   Discharge Instructions   None    ED Prescriptions    Medication Sig Dispense Auth. Provider   promethazine (PHENERGAN) 25 MG tablet Take 1 tablet (25 mg total) by mouth every 8 (eight) hours as needed for nausea or vomiting. 30 tablet Coral Spikes, DO     PDMP not reviewed this encounter.   Coral Spikes, [place] 08/19/19 1625

## 2019-08-19 NOTE — ED Triage Notes (Signed)
Patient c/o N/V/D that early this morning.

## 2019-08-20 ENCOUNTER — Other Ambulatory Visit: Payer: Self-pay

## 2019-08-20 ENCOUNTER — Encounter: Payer: Self-pay | Admitting: Family Medicine

## 2019-08-20 ENCOUNTER — Ambulatory Visit (INDEPENDENT_AMBULATORY_CARE_PROVIDER_SITE_OTHER): Payer: BLUE CROSS/BLUE SHIELD | Admitting: Family Medicine

## 2019-08-20 DIAGNOSIS — I10 Essential (primary) hypertension: Secondary | ICD-10-CM | POA: Insufficient documentation

## 2019-08-20 DIAGNOSIS — F4321 Adjustment disorder with depressed mood: Secondary | ICD-10-CM | POA: Diagnosis not present

## 2019-08-20 DIAGNOSIS — S61011D Laceration without foreign body of right thumb without damage to nail, subsequent encounter: Secondary | ICD-10-CM | POA: Diagnosis not present

## 2019-08-20 DIAGNOSIS — Z13220 Encounter for screening for lipoid disorders: Secondary | ICD-10-CM

## 2019-08-20 DIAGNOSIS — K2900 Acute gastritis without bleeding: Secondary | ICD-10-CM

## 2019-08-20 MED ORDER — AMLODIPINE BESYLATE 5 MG PO TABS: 10.0000 mg | ORAL_TABLET | Freq: Every day | ORAL | 3 refills | Status: DC

## 2019-08-20 NOTE — Telephone Encounter (Signed)
Emily notified.

## 2019-08-20 NOTE — Progress Notes (Signed)
Name: Linda Rhodes   MRN: [id number]    DOB: [birth year]   Date:08/20/2019       Progress Note  Subjective  Chief Complaint  Chief Complaint  Patient presents with  . Follow-up    2 weeks   . Referral    hand specialist, per [facility] must see [facility] or someone at [facility]  . GI Problem    Urgent Care recheck    I connected with  Cuca Morefield Gosling on 08/20/19 at  2:00 PM EST by telephone and verified that I am speaking with the correct person using two identifiers.  I discussed the limitations, risks, security and privacy concerns of performing an evaluation and management service by telephone and the availability of in person appointments. Staff also discussed with the patient that there may be a patient responsible charge related to this service. Patient Location: Home Provider Location: Office Additional Individuals present: None  HPI  Pt presents for 2 week follow up:  Gastritis: Was seen in UC yesterday for gatritis.  She went out to eat, then ate leftovers later that night.  Woke up at 2am vomiting.  She is feeling much better now.  Took 2 phenergan yesterday, none today and is feeling well. No longer vomiting, drinking okay, appetite is slowly coming back.  Denies abdominal pain.  Does still have some diarrhea.    HTN: She has been checking BP - 140's/110's at home.  She is having occasional headaches, but the severity is much better now.  She denies chest pain, shortness of breath, BLE edema.  Again reviewed symptoms of stroke - she denies any confusion, slurred speech, extremity weakness.  Taking amlodipine 5mg  - will increase to 10mg ; also taking metoprolol 50mg  QHS.  Grief with depression and anxiety: She feels like she is doing a lot better with starting lexapro daily.  Has taken hydroxyzine PRN - 3 times in the last 2 weeks. Denies SI/HI.  She was referred to CCM, but missed their call; given Chrystal Land LCSW's number to call back about counseling services.  She  is sleeping much better now, fewer crying spells and panic attacks.  Interim history from 08/06/2019: She has been grieving the loss of her mother to [place] in May 2020, and more recently the loss of a cousin and uncle to COVID-27.  She is tearful in office, states she has not been sleeping, is going to work, having panic attacks sometimes when she is reminded of her mom's death.  She endorses headaches some days; has very rare chest pain that is brief and usually with emotional distress, occasional dependent edema.   Patient Active Problem List   Diagnosis Date Noted  . 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) 10/06/2018  . Herpes zoster without complication [id number]  . Elevated blood pressure reading 07/18/2018  . Radiculopathy of lumbar region 12/15/2017  . Status post lumbar laminectomy 12/15/2017  . Intractable chronic migraine without aura and without status migrainosus 08/04/2016  . S/P hysterectomy 05/06/2015    Past Surgical History:  Procedure Laterality Date  . ABDOMINAL HYSTERECTOMY    . BACK SURGERY  2016   [facility] - ruptured L4-5  . CESAREAN SECTION    . CHOLECYSTECTOMY    . CYSTOSCOPY N/A 05/06/2015   Procedure: CYSTOSCOPY;  Surgeon: Aletha Halim, MD;  Location: [facility];  Service: Gynecology;  Laterality: N/A;  . ESOPHAGOGASTRODUODENOSCOPY (EGD) WITH PROPOFOL N/A 04/13/2018   Procedure: ESOPHAGOGASTRODUODENOSCOPY (EGD) WITH PROPOFOL;  Surgeon: Sherri Sear  Reece Levy, MD;  Location: [place];  Service: Gastroenterology;  Laterality: N/A;  . ETHMOIDECTOMY Right 11/25/2015   Procedure:  ANTERIOR ETHMOIDECTOMY;  Surgeon: Carloyn Manner, MD;  Location: [place];  Service: ENT;  Laterality: Right;  . FRONTAL SINUS EXPLORATION Right 11/25/2015   Procedure: FRONTAL SINUSOTOMY;  Surgeon: Carloyn Manner, MD;  Location: [place];  Service: ENT;  Laterality: Right;  . IMAGE GUIDED SINUS SURGERY N/A 11/25/2015   Procedure:  IMAGE GUIDED SINUS SURGERY WITH BALLOON;  Surgeon: Carloyn Manner, MD;  Location: [place];  Service: ENT;  Laterality: N/A;  . LAPAROSCOPIC HYSTERECTOMY N/A 05/06/2015   Procedure: HYSTERECTOMY TOTAL LAPAROSCOPIC;  Surgeon: Aletha Halim, MD;  Location: [facility];  Service: Gynecology;  Laterality: N/A;  . LUMBAR LAMINECTOMY/DECOMPRESSION MICRODISCECTOMY N/A 10/04/2016   Procedure: right minimally invasive L5 S1 microdiscectomy;  Surgeon: Bayard Hugger, MD;  Location: [facility];  Service: Neurosurgery;  Laterality: N/A;  . MAXILLARY ANTROSTOMY Right 11/25/2015   Procedure: MAXILLARY ANTROSTOMY WITH TISSUE REMOVAL;  Surgeon: Carloyn Manner, MD;  Location: [place];  Service: ENT;  Laterality: Right;  . WISDOM TOOTH EXTRACTION  10/2115   3 teeth removed.    Family History  Problem Relation Age of Onset  . Diabetes Mother   . Hypertension Mother   . Diabetes Father   . Hypertension Father     Social History   Socioeconomic History  . Marital status: Single    Spouse name: Not on file  . Number of children: 1  . Years of education: Not on file  . Highest education level: Associate degree: occupational, Hotel manager, or vocational program  Occupational History  . Not on file  Social Needs  . Financial resource strain: Not hard at all  . Food insecurity    Worry: Never true    Inability: Never true  . Transportation needs    Medical: No    Non-medical: No  Tobacco Use  . Smoking status: Former Smoker    Packs/day: 0.50    Years: 7.00    Pack years: 3.50    Types: Cigarettes    Quit date: 10/19/2015    Years since quitting: 3.8  . Smokeless tobacco: Never Used  Substance and Sexual Activity  . Alcohol use: Yes    Comment: socially - 1x/mo  . Drug use: No  . Sexual activity: Yes    Partners: Female  Lifestyle  . Physical activity    Days per week: 0 days    Minutes per session: 0 min  . Stress: Not at all  Relationships  . Social connections    Talks  on phone: More than three times a week    Gets together: More than three times a week    Attends religious service: More than 4 times per year    Active member of club or organization: No    Attends meetings of clubs or organizations: Never    Relationship status: Never married  . Intimate partner violence    Fear of current or ex partner: No    Emotionally abused: No    Physically abused: No    Forced sexual activity: No  Other Topics Concern  . Not on file  Social History Narrative  . Not on file     Current Outpatient Medications:  .  acetaminophen (TYLENOL) 500 MG tablet, Take 1,000 mg by mouth every 6 (six) hours as needed for mild pain., Disp: , Rfl:  .  amLODipine (NORVASC) 5 MG  tablet, Take 1 tablet (5 mg total) by mouth daily., Disp: 90 tablet, Rfl: 3 .  escitalopram (LEXAPRO) 10 MG tablet, Take 1/2 tablet once daily for 7 days, then increase to 1 tablet once daily, Disp: 30 tablet, Rfl: 1 .  hydrOXYzine (ATARAX/VISTARIL) 10 MG tablet, Take 1 tablet (10 mg total) by mouth 3 (three) times daily as needed for anxiety., Disp: 30 tablet, Rfl: 0 .  metoprolol succinate (TOPROL-XL) 50 MG 24 hr tablet, Take 1 tablet (50 mg total) by mouth at bedtime. Take with or immediately following a meal., Disp: 90 tablet, Rfl: 1 .  promethazine (PHENERGAN) 25 MG tablet, Take 1 tablet (25 mg total) by mouth every 8 (eight) hours as needed for nausea or vomiting., Disp: 30 tablet, Rfl: 0 .  spironolactone (ALDACTONE) 100 MG tablet, Take 1 tablet (100 mg total) by mouth 3 (three) times daily., Disp: 90 tablet, Rfl: 3 .  valACYclovir (VALTREX) 1000 MG tablet, Take 1 tablet (1,000 mg total) by mouth 3 (three) times daily., Disp: 21 tablet, Rfl: 1 .  pantoprazole (PROTONIX) 40 MG tablet, Take 1 tablet (40 mg total) by mouth 2 (two) times daily before a meal., Disp: 180 tablet, Rfl: 0  Allergies  Allergen Reactions  . Aspirin Other (See Comments)    Causes burning feeling in her stomach.   . Adhesive  [Tape] Rash    And skin tears from stronger tapes    I personally reviewed active problem list, medication list, allergies, notes from last encounter, lab results with the patient/caregiver today.   ROS Ten systems reviewed and is negative except as mentioned in HPI  Objective  Virtual encounter, vitals not obtained.  There is no height or weight on file to calculate BMI.  Physical Exam  Pulmonary/Chest: Effort normal. No respiratory distress. Speaking in complete sentences Neurological: Pt is alert and oriented to person, place, and time. Coordination, speech and gait are normal.  Psychiatric: Patient has slightly depressed mood and normal affect. behavior is normal. Judgment and thought content normal.  No results found for this or any previous visit (from the past 72 hour(s)).  PHQ2/9: Depression screen [facility] 2/9 08/20/2019 08/06/2019 05/31/2019 11/13/2018 11/03/2018  Decreased Interest 0 2 0 0 0  Down, Depressed, Hopeless 0 1 0 0 0  PHQ - 2 Score 0 3 0 0 0  Altered sleeping 0 1 0 0 0  Tired, decreased energy 0 1 0 0 0  Change in appetite 0 1 0 0 0  Feeling bad or failure about yourself  0 1 0 0 0  Trouble concentrating 0 2 0 0 0  Moving slowly or fidgety/restless 0 0 0 0 0  Suicidal thoughts 0 - 0 0 0  PHQ-9 Score 0 9 0 0 0  Difficult doing work/chores Not difficult at all Somewhat difficult Not difficult at all Not difficult at all Not difficult at all   PHQ-2/9 Result is negative.    Fall Risk: Fall Risk  08/20/2019 08/06/2019 05/31/2019 11/13/2018 11/03/2018  Falls in the past year? 0 0 0 0 0  Number falls in past yr: 0 0 0 0 0  Injury with Fall? 0 0 0 0 0  Follow up Falls evaluation completed Falls evaluation completed Falls evaluation completed - -    Assessment & Plan  1. Essential hypertension - Increase amlodipine to 10mg ; follow up in 2 weeks for a recheck and labs as below - amLODipine (NORVASC) 5 MG tablet; Take 2 tablets (10 mg total) by  mouth daily.   Dispense: 90 tablet; Refill: 3 - COMPLETE METABOLIC PANEL WITH GFR - Lipid panel - CBC with Differential/Platelet - TSH  2. Grief reaction - Continue lexapro and PRN hydoxyzine; improving with this.  Counseling with our LCSW.  3. Laceration of right thumb without damage to nail, foreign body presence unspecified, subsequent encounter - Ambulatory referral to Hand Surgery  4. Lipid screening - Lipid panel  5. Acute gastritis without hemorrhage, unspecified gastritis type - Improving  I discussed the assessment and treatment plan with the patient. The patient was provided an opportunity to ask questions and all were answered. The patient agreed with the plan and demonstrated an understanding of the instructions.   The patient was advised to call back or seek an in-person evaluation if the symptoms worsen or if the condition fails to improve as anticipated.  I provided 15 minutes of non-face-to-face time during this encounter.  Hubbard Hartshorn, FNP

## 2019-08-21 ENCOUNTER — Encounter: Payer: Self-pay | Admitting: *Deleted

## 2019-08-21 ENCOUNTER — Ambulatory Visit: Payer: Self-pay | Admitting: *Deleted

## 2019-08-21 DIAGNOSIS — F4321 Adjustment disorder with depressed mood: Secondary | ICD-10-CM

## 2019-08-22 NOTE — Chronic Care Management (AMB) (Signed)
Care Management    Clinical Social Work General Note  08/22/2019 Name: Linda Rhodes MRN: [id number] DOB: 05-13-1976  Linda Rhodes is a 43 y.o. year old female who is a primary care patient of Hubbard Hartshorn, FNP. The CCM was consulted to assist the patient with Grief Counseling.   Ms. Murden was given information about Care Management services today including:  1. CM service includes personalized support from designated clinical staff supervised by her physician, including individualized plan of care and coordination with other care providers 2. 24/7 contact phone numbers for assistance for urgent and routine care needs. 3. The patient may stop CM services at any time (effective at the end of the month) by phone call to the office staff.  Patient agreed to services and verbal consent obtained.   Review of patient status, including review of consultants reports, relevant laboratory and other test results, and collaboration with appropriate care team members and the patient's provider was performed as part of comprehensive patient evaluation and provision of chronic care management services.    SDOH (Social Determinants of Health) screening performed today. See Care Plan Entry related to challenges with: Depression   Physical Activity  Advanced Directives Status: <no information> See Care Plan for related entries.   Outpatient Encounter Medications as of 08/21/2019  Medication Sig  . acetaminophen (TYLENOL) 500 MG tablet Take 1,000 mg by mouth every 6 (six) hours as needed for mild pain.  Marland Kitchen amLODipine (NORVASC) 5 MG tablet Take 2 tablets (10 mg total) by mouth daily.  Marland Kitchen escitalopram (LEXAPRO) 10 MG tablet Take 1/2 tablet once daily for 7 days, then increase to 1 tablet once daily  . hydrOXYzine (ATARAX/VISTARIL) 10 MG tablet Take 1 tablet (10 mg total) by mouth 3 (three) times daily as needed for anxiety.  . metoprolol succinate (TOPROL-XL) 50 MG 24 hr tablet Take 1 tablet (50 mg total) by  mouth at bedtime. Take with or immediately following a meal.  . promethazine (PHENERGAN) 25 MG tablet Take 1 tablet (25 mg total) by mouth every 8 (eight) hours as needed for nausea or vomiting.  Marland Kitchen spironolactone (ALDACTONE) 100 MG tablet Take 1 tablet (100 mg total) by mouth 3 (three) times daily.  . valACYclovir (VALTREX) 1000 MG tablet Take 1 tablet (1,000 mg total) by mouth 3 (three) times daily.   No facility-administered encounter medications on file as of 08/21/2019.     Goals Addressed            This Visit's Progress   . "I am having trouble dealing with my mother's death (pt-stated)       Current Barriers:  . Chronic Mental Health needs related to grief issues . Mental Health Concerns  . Suicidal Ideation/Homicidal Ideation: No  Clinical Social Work Goal(s):  Marland Kitchen Over the next 90 days, patient will follow up with Grief Counseling through Hospice and Palliative Care as recommended by SW  Interventions: . Patient interviewed and appropriate assessments performed: PHQ 2 and PHQ 9 . Patient interviewed and appropriate assessments performed . Patient encourged to express her feelings regarding her mother's death .  Supportive counseling and emotional support provided  . Patient provided with information about grief support program through Hospice and Palliative Care and recommended calling them to set up an initial appointment-contact number provided [phone] . Discussed plans with patient for ongoing care management follow up and provided patient with direct contact information for care management team   Patient Self Care Activities:  . Performs  ADL's independently . Performs IADL's independently  Patient Coping Strengths:  . Supportive Relationships . Family . Able to Communicate Effectively  Patient Self Care Deficits:  Marland Kitchen Knowledge deficit regaridng local grief support programs  Initial goal documentation         Follow Up Plan: SW will follow up with  patient by phone over the next 2 weeks       [place], [place] Worker  [facility] Care Management [phone]

## 2019-08-22 NOTE — Patient Instructions (Addendum)
Thank you allowing the Chronic Care Management Team to be a part of your care! It was a pleasure speaking with you today!  1. Please call the grief support program through Hospice and Palliative Care to schedule the initial appointment  CCM (Chronic Care Management) Team   Neldon Labella RN, BSN Nurse Care Coordinator  [phone]  Ruben Reason PharmD  Clinical Pharmacist  [phone]   [place], LCSW Clinical Social Worker [phone]  Goals Addressed            This Visit's Progress   . "I am having trouble dealing with my mother's death (pt-stated)       Current Barriers:  . Chronic Mental Health needs related to grief issues . Mental Health Concerns  . Suicidal Ideation/Homicidal Ideation: No  Clinical Social Work Goal(s):  Marland Kitchen Over the next 90 days, patient will follow up with grief counseling through Hospice and Palliative Care as recommended by SW  Interventions: . Patient interviewed and appropriate assessments performed: PHQ 2 and PHQ 9 . Patient interviewed and appropriate assessments performed . Patient encouraged to express her feelings regarding her mother's death .  Supportive counseling and emotional support provided  . Patient provided with information about grief support program through Hospice and Palliative Care and recommended calling them to set up an initial appointment-contact number provided [phone] . Discussed plans with patient for ongoing care management follow up and provided patient with direct contact information for care management team   Patient Self Care Activities:  . Performs ADL's independently . Performs IADL's independently  Patient Coping Strengths:  . Supportive Relationships . Family . Able to Communicate Effectively  Patient Self Care Deficits:  Marland Kitchen Knowledge deficit regaridng local grief support programs  Initial goal documentation         The patient verbalized understanding of instructions  provided today and declined a print copy of patient instruction materials.   Telephone follow up appointment with care management team member scheduled for:09/04/19

## 2019-08-22 NOTE — Progress Notes (Signed)
This encounter was created in error - please disregard.

## 2019-08-28 ENCOUNTER — Other Ambulatory Visit: Payer: Self-pay | Admitting: Family Medicine

## 2019-08-28 DIAGNOSIS — F4320 Adjustment disorder, unspecified: Secondary | ICD-10-CM

## 2019-08-28 DIAGNOSIS — F4321 Adjustment disorder with depressed mood: Secondary | ICD-10-CM

## 2019-08-28 NOTE — Telephone Encounter (Signed)
Requested medication (s) are due for refill today:yes  Requested medication (s) are on the active medication list: yes  Last refill:  08/06/2019  Future visit scheduled: yes  Notes to clinic:  Requesting 90 day supply   Requested Prescriptions  Pending Prescriptions Disp Refills   escitalopram (LEXAPRO) 10 MG tablet [Pharmacy Med Name: ESCITALOPRAM 10 MG TABLET] 90 tablet 1    Sig: Take 1/2 tablet once daily for 7 days, then increase to 1 tablet once daily     Psychiatry:  Antidepressants - SSRI Failed - 08/28/2019  8:31 AM      Failed - Completed PHQ-2 or PHQ-9 in the last 360 days.      Passed - Valid encounter within last 6 months    Recent Outpatient Visits          1 week ago Essential hypertension   [place], [place], FNP   3 weeks ago Essential hypertension   [place], FNP   2 months ago [place], [place]   9 months ago Rash of unknown cause   [place], NP   9 months ago Herpes zoster without complication   [place], NP      Future Appointments            In 6 days Hubbard Hartshorn, [facility], [place]   In 3 weeks Hubbard Hartshorn, [facility], [facility]

## 2019-09-03 ENCOUNTER — Ambulatory Visit: Payer: BLUE CROSS/BLUE SHIELD

## 2019-09-04 ENCOUNTER — Ambulatory Visit: Payer: Self-pay | Admitting: *Deleted

## 2019-09-04 NOTE — Chronic Care Management (AMB) (Signed)
   Care Management    Clinical Social Work General Follow Up Note  09/04/2019 Name: Linda Rhodes MRN: [id number] DOB: [birth year]  Linda Rhodes is a 43 y.o. year old female who is a primary care patient of Hubbard Hartshorn, FNP. The CCM team was consulted for assistance with Mental Health Counseling and Resources.   Patient contacted and requested a call back at 2:30pm today as she was at work and could not take the call. Patient did not return call. This Education officer, museum to attempt another contact with patient within 7-10 days.  Review of patient status, including review of consultants reports, relevant laboratory and other test results, and collaboration with appropriate care team members and the patient's provider was performed as part of comprehensive patient evaluation and provision of chronic care management services.     Advanced Directives Status: <no information> See Care Plan for related entries.   Outpatient Encounter Medications as of 09/04/2019  Medication Sig  . acetaminophen (TYLENOL) 500 MG tablet Take 1,000 mg by mouth every 6 (six) hours as needed for mild pain.  Marland Kitchen amLODipine (NORVASC) 5 MG tablet Take 2 tablets (10 mg total) by mouth daily.  Marland Kitchen escitalopram (LEXAPRO) 10 MG tablet TAKE 1/2 TABLET ONCE DAILY FOR 7 DAYS, THEN INCREASE TO 1 TABLET ONCE DAILY  . hydrOXYzine (ATARAX/VISTARIL) 10 MG tablet Take 1 tablet (10 mg total) by mouth 3 (three) times daily as needed for anxiety.  . metoprolol succinate (TOPROL-XL) 50 MG 24 hr tablet Take 1 tablet (50 mg total) by mouth at bedtime. Take with or immediately following a meal.  . pantoprazole (PROTONIX) 40 MG tablet Take 1 tablet (40 mg total) by mouth 2 (two) times daily before a meal.  . promethazine (PHENERGAN) 25 MG tablet Take 1 tablet (25 mg total) by mouth every 8 (eight) hours as needed for nausea or vomiting.  Marland Kitchen spironolactone (ALDACTONE) 100 MG tablet Take 1 tablet (100 mg total) by mouth 3 (three) times daily.  .  valACYclovir (VALTREX) 1000 MG tablet Take 1 tablet (1,000 mg total) by mouth 3 (three) times daily.  . [DISCONTINUED] famotidine (PEPCID) 20 MG tablet Take 1 tablet (20 mg total) by mouth 2 (two) times daily.  . [DISCONTINUED] gabapentin (NEURONTIN) 300 MG capsule Take 1 capsule (300 mg total) by mouth at bedtime as needed.   No facility-administered encounter medications on file as of 09/04/2019.     Goals Addressed   None       Follow Up Plan: SW will follow up with patient by phone over the next 7-10 days    [place], [facility] Care Management [phone]

## 2019-09-06 ENCOUNTER — Ambulatory Visit: Payer: BLUE CROSS/BLUE SHIELD

## 2019-09-10 ENCOUNTER — Ambulatory Visit: Payer: BLUE CROSS/BLUE SHIELD

## 2019-09-11 ENCOUNTER — Telehealth: Payer: Self-pay

## 2019-09-11 ENCOUNTER — Ambulatory Visit: Payer: Self-pay | Admitting: *Deleted

## 2019-09-11 NOTE — Chronic Care Management (AMB) (Signed)
  Care Management   Social Work Note  09/11/2019 Name: Linda Rhodes MRN: [id number] DOB: [birth year]  Linda Rhodes is a 43 y.o. year old female who sees Linda Hartshorn, FNP for primary care. The CCM team was consulted for assistance with Grief Counseling.   Phone call to patient today to follow up on grief counseling referrals previously provided. Patient answered the phone but stated that she could not talk today as she was at work. Patient states that she will call this social worker when she get's off work.  Outpatient Encounter Medications as of 09/11/2019  Medication Sig  . acetaminophen (TYLENOL) 500 MG tablet Take 1,000 mg by mouth every 6 (six) hours as needed for mild pain.  Marland Kitchen amLODipine (NORVASC) 5 MG tablet Take 2 tablets (10 mg total) by mouth daily.  Marland Kitchen escitalopram (LEXAPRO) 10 MG tablet TAKE 1/2 TABLET ONCE DAILY FOR 7 DAYS, THEN INCREASE TO 1 TABLET ONCE DAILY  . hydrOXYzine (ATARAX/VISTARIL) 10 MG tablet Take 1 tablet (10 mg total) by mouth 3 (three) times daily as needed for anxiety.  . metoprolol succinate (TOPROL-XL) 50 MG 24 hr tablet Take 1 tablet (50 mg total) by mouth at bedtime. Take with or immediately following a meal.  . pantoprazole (PROTONIX) 40 MG tablet Take 1 tablet (40 mg total) by mouth 2 (two) times daily before a meal.  . promethazine (PHENERGAN) 25 MG tablet Take 1 tablet (25 mg total) by mouth every 8 (eight) hours as needed for nausea or vomiting.  Marland Kitchen spironolactone (ALDACTONE) 100 MG tablet Take 1 tablet (100 mg total) by mouth 3 (three) times daily.  . valACYclovir (VALTREX) 1000 MG tablet Take 1 tablet (1,000 mg total) by mouth 3 (three) times daily.  . [DISCONTINUED] famotidine (PEPCID) 20 MG tablet Take 1 tablet (20 mg total) by mouth 2 (two) times daily.  . [DISCONTINUED] gabapentin (NEURONTIN) 300 MG capsule Take 1 capsule (300 mg total) by mouth at bedtime as needed.   No facility-administered encounter medications on file as of 09/11/2019.     Goals Addressed   None     Follow Up Plan: SW will follow up with patient by phone over the next in 2 weeks if no return call this afternoon  [place], [facility] Care Management [phone]

## 2019-09-14 ENCOUNTER — Other Ambulatory Visit: Payer: Self-pay | Admitting: Family Medicine

## 2019-09-14 DIAGNOSIS — F4321 Adjustment disorder with depressed mood: Secondary | ICD-10-CM

## 2019-09-14 NOTE — Telephone Encounter (Signed)
Requested medication (s) are due for refill today: yes  Requested medication (s) are on the active medication list: yes  Last refill:  08/28/2019  Future visit scheduled: yes  Notes to clinic:  Requesting 90 day supply   Requested Prescriptions  Pending Prescriptions Disp Refills   escitalopram (LEXAPRO) 10 MG tablet [Pharmacy Med Name: ESCITALOPRAM 10 MG TABLET] 90 tablet 1    Sig: TAKE 1/2 TABLET ONCE DAILY FOR 7 DAYS, THEN INCREASE TO 1 TABLET ONCE DAILY     Psychiatry:  Antidepressants - SSRI Failed - 09/14/2019  9:03 AM      Failed - Completed PHQ-2 or PHQ-9 in the last 360 days.      Passed - Valid encounter within last 6 months    Recent Outpatient Visits          3 weeks ago Essential hypertension   [place], [place], FNP   1 month ago Essential hypertension   [place], FNP   3 months ago [place], [place]   10 months ago Rash of unknown cause   [place], NP   10 months ago Herpes zoster without complication   [place], NP      Future Appointments            In 4 days Hubbard Hartshorn, [facility], [facility]

## 2019-09-18 ENCOUNTER — Encounter: Payer: Self-pay | Admitting: Family Medicine

## 2019-09-18 ENCOUNTER — Ambulatory Visit (INDEPENDENT_AMBULATORY_CARE_PROVIDER_SITE_OTHER): Payer: BLUE CROSS/BLUE SHIELD | Admitting: Family Medicine

## 2019-09-18 ENCOUNTER — Other Ambulatory Visit: Payer: Self-pay

## 2019-09-18 DIAGNOSIS — I10 Essential (primary) hypertension: Secondary | ICD-10-CM | POA: Diagnosis not present

## 2019-09-18 DIAGNOSIS — F4321 Adjustment disorder with depressed mood: Secondary | ICD-10-CM

## 2019-09-18 DIAGNOSIS — J069 Acute upper respiratory infection, unspecified: Secondary | ICD-10-CM | POA: Diagnosis not present

## 2019-09-18 MED ORDER — AMLODIPINE BESYLATE 10 MG PO TABS: 10.0000 mg | ORAL_TABLET | Freq: Every day | ORAL | 3 refills | Status: DC

## 2019-09-18 MED ORDER — ESCITALOPRAM OXALATE 20 MG PO TABS: 20.0000 mg | ORAL_TABLET | Freq: Every day | ORAL | 0 refills | Status: DC

## 2019-09-18 MED ORDER — BUSPIRONE HCL 5 MG PO TABS: ORAL_TABLET | ORAL | 1 refills | Status: DC

## 2019-09-18 MED ORDER — CHLORTHALIDONE 25 MG PO TABS: 25.0000 mg | ORAL_TABLET | Freq: Every day | ORAL | 1 refills | Status: DC

## 2019-09-18 MED ORDER — HYDROXYZINE HCL 10 MG PO TABS: 10.0000 mg | ORAL_TABLET | Freq: Three times a day (TID) | ORAL | 0 refills | Status: DC | PRN

## 2019-09-18 NOTE — Progress Notes (Signed)
Name: Linda Rhodes   MRN: [id number]    DOB: [birth year]   Date:09/18/2019       Progress Note  Subjective  Chief Complaint  Chief Complaint  Patient presents with  . Follow-up  . URI    runny nose, sore throat, headache    I connected with  Linda Rhodes on 09/18/19 at  1:40 PM EST by telephone and verified that I am speaking with the correct person using two identifiers.  I discussed the limitations, risks, security and privacy concerns of performing an evaluation and management service by telephone and the availability of in person appointments. Staff also discussed with the patient that there may be a patient responsible charge related to this service. Patient Location: Home Provider Location: Office Additional Individuals present: None  HPI   Pt presents for follow up on BP and Anxiety along with concern for URI:  URI: She started having symptoms on Friday 09/14/2019.  Has rhiniorrhea, sore throat, headache, dry cough.  Denies abnormal fatigue, shortness of breath, chest pain.  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  Had fever Friday, none since then.  Will go for COVID testing as community spread is quite high right now.  Nyquil and Dayquil for symptom management.  Will stay quarantined until results are returned.  HTN: She has been checking BPs at home - 150's/100's at home. Home BP today is 152/92.   She is having occasional headaches, but the severity is much better now.  She denies chest pain, shortness of breath, BLE edema.  Again reviewed symptoms of stroke - she denies any confusion, slurred speech, extremity weakness.  Taking amlodipine 10mg , metoprolol 50mg  QHS, and spironolactone 100mg  TID.  We will add chlorthalidone - 25mg  and recheck labs and BP in office in 2 weeks.  Grief with depression and anxiety: She initially felt like she is doing a lot better with starting lexapro daily, but now her anxiety has gotten worse  again.  Has taken hydroxyzine PRN - now needing to take at least once daily.  Denies SI/HI.  She was referred to [facility], but missed their call; given Linda Rhodes's number to call back about counseling services.  She is barely sleeping and anxiety and grief have been much worse the last 2 weeks or so- recent Thanksgiving holiday contributed to her grief as well.  Interim history from 08/06/2019: She has been grieving the loss of her mother to [place] in May 2020, and more recently the loss of a cousin and uncle to COVID-48. She is tearful in office, states she has not been sleeping, is going to work, having panic attacks sometimes when she is reminded of her mom's death. She endorses headaches some days; has very rare chest pain that is brief and usually with emotional distress, occasional dependent edema.   Patient Active Problem List   Diagnosis Date Noted  . Grief reaction 08/20/2019  . Essential hypertension 08/20/2019  . 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 Herpes zoster without complication [id number]  . Elevated blood pressure reading 07/18/2018  . Radiculopathy of lumbar region 12/15/2017  . Status post lumbar laminectomy 12/15/2017  . Intractable chronic migraine without aura and without status migrainosus 08/04/2016  . S/P hysterectomy 05/06/2015    Past Surgical History:  Procedure Laterality Date  . ABDOMINAL HYSTERECTOMY    . BACK SURGERY  2016   [facility] Neurosurgery - ruptured L4-5  . CESAREAN  SECTION    . CHOLECYSTECTOMY    . CYSTOSCOPY N/A 05/06/2015   Procedure: CYSTOSCOPY;  Surgeon: Aletha Halim, MD;  Location: [facility] ORS;  Service: Gynecology;  Laterality: N/A;  . ESOPHAGOGASTRODUODENOSCOPY (EGD) WITH PROPOFOL N/A 04/13/2018   Procedure: ESOPHAGOGASTRODUODENOSCOPY (EGD) WITH PROPOFOL;  Surgeon: Lin Landsman, MD;  Location: [facility]COPY;  Service: Gastroenterology;  Laterality: N/A;  . ETHMOIDECTOMY Right 11/25/2015    Procedure:  ANTERIOR ETHMOIDECTOMY;  Surgeon: Carloyn Manner, MD;  Location: [place];  Service: ENT;  Laterality: Right;  . FRONTAL SINUS EXPLORATION Right 11/25/2015   Procedure: FRONTAL SINUSOTOMY;  Surgeon: Carloyn Manner, MD;  Location: [place];  Service: ENT;  Laterality: Right;  . IMAGE GUIDED SINUS SURGERY N/A 11/25/2015   Procedure: IMAGE GUIDED SINUS SURGERY WITH BALLOON;  Surgeon: Carloyn Manner, MD;  Location: [place];  Service: ENT;  Laterality: N/A;  . LAPAROSCOPIC HYSTERECTOMY N/A 05/06/2015   Procedure: HYSTERECTOMY TOTAL LAPAROSCOPIC;  Surgeon: Aletha Halim, MD;  Location: [facility] ORS;  Service: Gynecology;  Laterality: N/A;  . LUMBAR LAMINECTOMY/DECOMPRESSION MICRODISCECTOMY N/A 10/04/2016   Procedure: right minimally invasive L5 S1 microdiscectomy;  Surgeon: Bayard Hugger, MD;  Location: [facility] ORS;  Service: Neurosurgery;  Laterality: N/A;  . MAXILLARY ANTROSTOMY Right 11/25/2015   Procedure: MAXILLARY ANTROSTOMY WITH TISSUE REMOVAL;  Surgeon: Carloyn Manner, MD;  Location: [place];  Service: ENT;  Laterality: Right;  . WISDOM TOOTH EXTRACTION  10/2115   3 teeth removed.    Family History  Problem Relation Age of Onset  . Diabetes Mother   . Hypertension Mother   . Diabetes Father   . Hypertension Father     Social History   Socioeconomic History  . Marital status: Single    Spouse name: Not on file  . Number of children: 1  . Years of education: Not on file  . Highest education level: Associate degree: occupational, Hotel manager, or vocational program  Occupational History  . Not on file  Social Needs  . Financial resource strain: Not hard at all  . Food insecurity    Worry: Never true    Inability: Never true  . Transportation needs    Medical: No    Non-medical: No  Tobacco Use  . Smoking status: Former Smoker    Packs/day: 0.50    Years: 7.00    Pack years: 3.50    Types: Cigarettes    Quit date: 10/19/2015     Years since quitting: 3.9  . Smokeless tobacco: Never Used  Substance and Sexual Activity  . Alcohol use: Yes    Comment: socially - 1x/mo  . Drug use: No  . Sexual activity: Yes    Partners: Female  Lifestyle  . Physical activity    Days per week: 0 days    Minutes per session: 0 min  . Stress: Not at all  Relationships  . Social connections    Talks on phone: More than three times a week    Gets together: More than three times a week    Attends religious service: More than 4 times per year    Active member of club or organization: No    Attends meetings of clubs or organizations: Never    Relationship status: Never married  . Intimate partner violence    Fear of current or ex partner: No    Emotionally abused: No    Physically abused: No    Forced sexual activity: No  Other Topics Concern  .  Not on file  Social History Narrative  . Not on file     Current Outpatient Medications:  .  acetaminophen (TYLENOL) 500 MG tablet, Take 1,000 mg by mouth every 6 (six) hours as needed for mild pain., Disp: , Rfl:  .  amLODipine (NORVASC) 5 MG tablet, Take 2 tablets (10 mg total) by mouth daily., Disp: 90 tablet, Rfl: 3 .  hydrOXYzine (ATARAX/VISTARIL) 10 MG tablet, Take 1 tablet (10 mg total) by mouth 3 (three) times daily as needed for anxiety., Disp: 30 tablet, Rfl: 0 .  metoprolol succinate (TOPROL-XL) 50 MG 24 hr tablet, Take 1 tablet (50 mg total) by mouth at bedtime. Take with or immediately following a meal., Disp: 90 tablet, Rfl: 1 .  promethazine (PHENERGAN) 25 MG tablet, Take 1 tablet (25 mg total) by mouth every 8 (eight) hours as needed for nausea or vomiting., Disp: 30 tablet, Rfl: 0 .  spironolactone (ALDACTONE) 100 MG tablet, Take 1 tablet (100 mg total) by mouth 3 (three) times daily., Disp: 90 tablet, Rfl: 3 .  valACYclovir (VALTREX) 1000 MG tablet, Take 1 tablet (1,000 mg total) by mouth 3 (three) times daily., Disp: 21 tablet, Rfl: 1 .  escitalopram (LEXAPRO) 10  MG tablet, TAKE 1/2 TABLET ONCE DAILY FOR 7 DAYS, THEN INCREASE TO 1 TABLET ONCE DAILY, Disp: 90 tablet, Rfl: 1 .  pantoprazole (PROTONIX) 40 MG tablet, Take 1 tablet (40 mg total) by mouth 2 (two) times daily before a meal., Disp: 180 tablet, Rfl: 0  Allergies  Allergen Reactions  . Aspirin Other (See Comments)    Causes burning feeling in her stomach.   . Adhesive [Tape] Rash    And skin tears from stronger tapes    I personally reviewed active problem list, medication list, allergies, health maintenance, notes from last encounter, lab results with the patient/caregiver today.   ROS Constitutional: Negative for fever or weight change.  Respiratory: Negative for cough and shortness of breath.   Cardiovascular: Negative for chest pain or palpitations.  Gastrointestinal: Negative for abdominal pain, no bowel changes.  Musculoskeletal: Negative for gait problem or joint swelling.  Skin: Negative for rash.  Neurological: Negative for dizziness or headache.  No other specific complaints in a complete review of systems (except as listed in HPI above).  Objective  Virtual encounter, vitals not obtained.  There is no height or weight on file to calculate BMI.  Physical Exam  Pulmonary/Chest: Effort normal. No respiratory distress. Speaking in complete sentences Neurological: Pt is alert and oriented to person, place, and time. Speech is normal Psychiatric: Patient has a normal mood and affect. behavior is normal. Judgment and thought content normal.  No results found for this or any previous visit (from the past 72 hour(s)).  PHQ2/9: Depression screen [facility] 2/9 09/18/2019 08/21/2019 08/20/2019 08/06/2019 05/31/2019  Decreased Interest 2 2 0 2 0  Down, Depressed, Hopeless 2 3 0 1 0  PHQ - 2 Score 4 5 0 3 0  Altered sleeping 3 3 0 1 0  Tired, decreased energy 3 3 0 1 0  Change in appetite 3 3 0 1 0  Feeling bad or failure about yourself  1 2 0 1 0  Trouble concentrating 2 1 0 2 0   Moving slowly or fidgety/restless 2 0 0 0 0  Suicidal thoughts 0 0 0 - 0  PHQ-9 Score 18 17 0 9 0  Difficult doing work/chores - - Not difficult at all Somewhat difficult Not difficult at all  PHQ-2/9 Result is positive.    Fall Risk: Fall Risk  08/20/2019 08/06/2019 05/31/2019 11/13/2018 11/03/2018  Falls in the past year? 0 0 0 0 0  Number falls in past yr: 0 0 0 0 0  Injury with Fall? 0 0 0 0 0  Follow up Falls evaluation completed Falls evaluation completed Falls evaluation completed - -     Assessment & Plan  1. Grief reaction Increase lexapro, add buspar low dose BID, hydroxyzine refilled.  She will call Rhodes back to restart grief counseling. - escitalopram (LEXAPRO) 20 MG tablet; Take 1 tablet (20 mg total) by mouth daily.  Dispense: 90 tablet; Refill: 0 - hydrOXYzine (ATARAX/VISTARIL) 10 MG tablet; Take 1 tablet (10 mg total) by mouth 3 (three) times daily as needed for anxiety.  Dispense: 30 tablet; Refill: 0 - busPIRone (BUSPAR) 5 MG tablet; Take 1 tablet once daily at night for 7 days, then increase to 1 tablet twice daily.  Dispense: 60 tablet; Refill: 1  2. Essential hypertension - Add chlorthalidone, return in 2 weeks for BP check and labs to ensure potassium and kidney function stability. Likely multifactorial - grief and anxiety likely contributing as well.  Continue amlodipine, metoprolol, and spironolactone - amLODipine (NORVASC) 10 MG tablet; Take 1 tablet (10 mg total) by mouth daily.  Dispense: 90 tablet; Refill: 3 - BMP w/ GFR - [facility] - chlorthalidone (HYGROTON) 25 MG tablet; Take 1 tablet (25 mg total) by mouth daily.  Dispense: 30 tablet; Refill: 1  3. 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 - Novel Coronavirus, NAA ([facility])   I discussed the assessment and treatment plan with the patient. The patient was provided an opportunity to ask questions and all were answered. The  patient agreed with the plan and demonstrated an understanding of the instructions.   The patient was advised to call back or seek an in-person evaluation if the symptoms worsen or if the condition fails to improve as anticipated.  I provided 25 minutes of non-face-to-face time during this encounter.  Hubbard Hartshorn, FNP

## 2019-09-20 ENCOUNTER — Other Ambulatory Visit: Payer: Self-pay

## 2019-09-20 DIAGNOSIS — Z20822 Contact with and (suspected) exposure to covid-19: Secondary | ICD-10-CM

## 2019-09-23 LAB — NOVEL CORONAVIRUS, NAA: SARS-CoV-2, NAA: DETECTED — AB

## 2019-09-24 ENCOUNTER — Encounter: Payer: Self-pay | Admitting: Family Medicine

## 2019-09-26 ENCOUNTER — Encounter: Payer: Self-pay | Admitting: Family Medicine

## 2019-09-27 ENCOUNTER — Encounter (INDEPENDENT_AMBULATORY_CARE_PROVIDER_SITE_OTHER): Payer: Self-pay

## 2019-09-27 ENCOUNTER — Telehealth: Payer: Self-pay | Admitting: *Deleted

## 2019-09-27 ENCOUNTER — Encounter: Payer: Self-pay | Admitting: Family Medicine

## 2019-09-27 ENCOUNTER — Other Ambulatory Visit: Payer: Self-pay

## 2019-09-27 ENCOUNTER — Ambulatory Visit (INDEPENDENT_AMBULATORY_CARE_PROVIDER_SITE_OTHER): Payer: BLUE CROSS/BLUE SHIELD | Admitting: Family Medicine

## 2019-09-27 VITALS — BP 140/90 | HR 80 | Temp 99.5°F

## 2019-09-27 DIAGNOSIS — R058 Other specified cough: Secondary | ICD-10-CM

## 2019-09-27 DIAGNOSIS — R05 Cough: Secondary | ICD-10-CM

## 2019-09-27 DIAGNOSIS — U071 COVID-19: Secondary | ICD-10-CM | POA: Diagnosis not present

## 2019-09-27 MED ORDER — BENZONATATE 100 MG PO CAPS: 100.0000 mg | ORAL_CAPSULE | Freq: Two times a day (BID) | ORAL | 0 refills | Status: DC | PRN

## 2019-09-27 MED ORDER — PROMETHAZINE-CODEINE 6.25-10 MG/5ML PO SOLN: 5.0000 mL | Freq: Four times a day (QID) | ORAL | 0 refills | Status: DC | PRN

## 2019-09-27 NOTE — Telephone Encounter (Signed)
BPA received for worsening cough and weakness via MyChart COVID Symptom Monitoring Questionnaire. Attempted to call pt x 2. Call answered but no verbal response. MyChart message sent to pt to return call.

## 2019-09-27 NOTE — Progress Notes (Signed)
Name: Linda Rhodes   MRN: [id number]    DOB: 1976-04-23   Date:09/27/2019       Progress Note  Subjective  Chief Complaint  Chief Complaint  Patient presents with  . covid-19    tested positive Sunday-has been taking Robitussin   . Sore Throat  . Headache  . Cough    Phlegm sometimes and sometimes a dry cough    I connected with  Linda Rhodes on 09/27/19 at 11:00 AM EST by telephone and verified that I am speaking with the correct person using two identifiers.   I discussed the limitations, risks, security and privacy concerns of performing an evaluation and management service by telephone and the availability of in person appointments. Staff also discussed with the patient that there may be a patient responsible charge related to this service. Patient Location: at home  Provider Location: [facility]    HPI  COVID-19: she developed headache and sore throat one week ago, got tested on 12/2 and results arrived on Sunday 09/23/2019. She has been self isolating at home with her female partner ( also sick ) , her father also is at home but he has been isolated in his bedroom. She states over the past couple of days she started to feel worse, having productive cough that is brownish in color, also has some SOB after coughing spell. She denies SOB with activity or wheezing. She has no appetite, but she has been eating a little and has been able to drink fluids. She has noticed a decrease in urine output and is dark in color. She is very fatigued, lack of taste and smell. She is not a smoker  Patient Active Problem List   Diagnosis Date Noted  . Grief reaction 08/20/2019  . Essential hypertension 08/20/2019  . Hyperaldosteronism with hyperplasia of adrenal cortex ([place]) 10/06/2018  . Herpes zoster without complication [id number]  . Elevated blood pressure reading 07/18/2018  . Radiculopathy of lumbar region 12/15/2017  . Status post lumbar laminectomy 12/15/2017  .  Intractable chronic migraine without aura and without status migrainosus 08/04/2016  . S/P hysterectomy 05/06/2015    Social History   Tobacco Use  . Smoking status: Former Smoker    Packs/day: 0.50    Years: 7.00    Pack years: 3.50    Types: Cigarettes    Quit date: 10/19/2015    Years since quitting: 3.9  . Smokeless tobacco: Never Used  Substance Use Topics  . Alcohol use: Yes    Comment: socially - 1x/mo     Current Outpatient Medications:  .  acetaminophen (TYLENOL) 500 MG tablet, Take 1,000 mg by mouth every 6 (six) hours as needed for mild pain., Disp: , Rfl:  .  amLODipine (NORVASC) 10 MG tablet, Take 1 tablet (10 mg total) by mouth daily., Disp: 90 tablet, Rfl: 3 .  busPIRone (BUSPAR) 5 MG tablet, Take 1 tablet once daily at night for 7 days, then increase to 1 tablet twice daily., Disp: 60 tablet, Rfl: 1 .  chlorthalidone (HYGROTON) 25 MG tablet, Take 1 tablet (25 mg total) by mouth daily., Disp: 30 tablet, Rfl: 1 .  escitalopram (LEXAPRO) 20 MG tablet, Take 1 tablet (20 mg total) by mouth daily., Disp: 90 tablet, Rfl: 0 .  hydrOXYzine (ATARAX/VISTARIL) 10 MG tablet, Take 1 tablet (10 mg total) by mouth 3 (three) times daily as needed for anxiety., Disp: 30 tablet, Rfl: 0 .  metoprolol succinate (TOPROL-XL) 50 MG 24 hr  tablet, Take 1 tablet (50 mg total) by mouth at bedtime. Take with or immediately following a meal., Disp: 90 tablet, Rfl: 1 .  promethazine (PHENERGAN) 25 MG tablet, Take 1 tablet (25 mg total) by mouth every 8 (eight) hours as needed for nausea or vomiting., Disp: 30 tablet, Rfl: 0 .  spironolactone (ALDACTONE) 100 MG tablet, Take 1 tablet (100 mg total) by mouth 3 (three) times daily., Disp: 90 tablet, Rfl: 3 .  valACYclovir (VALTREX) 1000 MG tablet, Take 1 tablet (1,000 mg total) by mouth 3 (three) times daily., Disp: 21 tablet, Rfl: 1 .  pantoprazole (PROTONIX) 40 MG tablet, Take 1 tablet (40 mg total) by mouth 2 (two) times daily before a meal., Disp: 180  tablet, Rfl: 0  Allergies  Allergen Reactions  . Aspirin Other (See Comments)    Causes burning feeling in her stomach.   . Adhesive [Tape] Rash    And skin tears from stronger tapes    I personally reviewed active problem list, medication list, allergies, family history, social history, health maintenance with the patient/caregiver today.  ROS  Ten systems reviewed and is negative except as mentioned in HPI   Objective  Virtual encounter, vitals obtained at home  Vitals:   09/27/19 0946  BP: 140/90  Pulse: 80  Temp: 99.5 F (37.5 C)    There is no height or weight on file to calculate BMI.   Physical Exam  Awake, alert and oriented Coughing and sounded congested   Assessment & Plan  1. COVID-19 virus infection  - Promethazine-Codeine 6.25-10 MG/5ML SOLN; Take 5 mLs by mouth every 6 (six) hours as needed.  Dispense: 280 mL; Refill: 0 - benzonatate (TESSALON) 100 MG capsule; Take 1-2 capsules (100-200 mg total) by mouth 2 (two) times daily as needed.  Dispense: 40 capsule; Refill: 0 - MYCHART COVID-19 HOME MONITORING PROGRAM - Temperature monitoring; Future  2. Productive cough  Explained that if symptoms gets worse we can call in a Zpack, for now advised to stay hydrated, cough medication and if possible to get a pulse oximeter to monitor pulse ox. Advised to go to [facility] if pulse ox below 92% , development of SOB or if urine remains dark / unable to drink fluids  -Red flags and when to present for emergency care or RTC including fever >101.70F, chest pain, shortness of breath, new/worsening/un-resolving symptoms,  reviewed with patient at time of visit. Follow up and care instructions discussed and provided in AVS. - I discussed the assessment and treatment plan with the patient. The patient was provided an opportunity to ask questions and all were answered. The patient agreed with the plan and demonstrated an understanding of the instructions.  - The patient was  advised to call back or seek an in-person evaluation if the symptoms worsen or if the condition fails to improve as anticipated.  I provided 15  minutes of non-face-to-face time during this encounter.  Loistine Chance, MD

## 2019-09-28 ENCOUNTER — Ambulatory Visit: Payer: BLUE CROSS/BLUE SHIELD | Admitting: Family Medicine

## 2019-09-29 ENCOUNTER — Encounter (INDEPENDENT_AMBULATORY_CARE_PROVIDER_SITE_OTHER): Payer: Self-pay

## 2019-09-29 ENCOUNTER — Telehealth: Payer: Self-pay

## 2019-09-29 NOTE — Telephone Encounter (Signed)
Vomiting 0300 6 times. Unable to keep fluids down. Mouth is dry. Last void was 0500 this am. Pt with dry mouth and headache. Advised pt to go to [facility] or call PCP. Pt advised to sip on fluids an if tolerated to slowly work her way up to eating bland foods such as cracker, pretzels, applesauce, toast, and cooked bland pasta or rice or oatmeal. Discussed signs and sx of dehydration. Advised if vomiting >6 times a day or for > 8 hours or more to call 911. Advised if having severe abdominal pain to call 911. Pt c/o weakness but is able to walk on her on. Advised if  She is unable to stand ot has to hold on to something to keep balance to call 911 and seek treatment immediately. Pt verbalized understanding.

## 2019-10-01 ENCOUNTER — Encounter (INDEPENDENT_AMBULATORY_CARE_PROVIDER_SITE_OTHER): Payer: Self-pay

## 2019-10-05 ENCOUNTER — Telehealth: Payer: Self-pay

## 2019-10-05 ENCOUNTER — Encounter (INDEPENDENT_AMBULATORY_CARE_PROVIDER_SITE_OTHER): Payer: Self-pay

## 2019-10-05 NOTE — Telephone Encounter (Signed)
Attempted to contact patient but no answer and wasn't able to leave vm.

## 2019-10-08 ENCOUNTER — Other Ambulatory Visit: Payer: Self-pay | Admitting: Family Medicine

## 2019-10-08 DIAGNOSIS — U071 COVID-19: Secondary | ICD-10-CM

## 2019-10-14 ENCOUNTER — Other Ambulatory Visit: Payer: Self-pay | Admitting: Family Medicine

## 2019-10-14 DIAGNOSIS — F4321 Adjustment disorder with depressed mood: Secondary | ICD-10-CM

## 2019-10-14 DIAGNOSIS — I10 Essential (primary) hypertension: Secondary | ICD-10-CM

## 2019-10-15 NOTE — Telephone Encounter (Signed)
Requested medication (s) are due for refill today: no  Requested medication (s) are on the active medication list: yes  Last refill: 09/18/2019  Notes to clinic:  requesting a 90 day supply    Requested Prescriptions  Pending Prescriptions Disp Refills   busPIRone (BUSPAR) 5 MG tablet [Pharmacy Med Name: BUSPIRONE HCL 5 MG TABLET] 180 tablet 1    Sig: Take 1 tablet once daily at night for 7 days, then increase to 1 tablet twice daily.      Psychiatry: Anxiolytics/Hypnotics - Non-controlled Passed - 10/14/2019  1:34 PM      Passed - Valid encounter within last 6 months    Recent Outpatient Visits           2 weeks ago COVID-19 virus infection   [facility] Steele Sizer, MD   3 weeks ago Essential hypertension   [place], [place], FNP   1 month ago Essential hypertension   [place], FNP   2 months ago Essential hypertension   [place], FNP   4 months ago [place], Astrid Divine, [place]

## 2019-10-16 ENCOUNTER — Ambulatory Visit: Payer: Self-pay | Admitting: *Deleted

## 2019-10-16 DIAGNOSIS — F4321 Adjustment disorder with depressed mood: Secondary | ICD-10-CM

## 2019-10-17 NOTE — Chronic Care Management (AMB) (Signed)
Care Management    Clinical Social Work Follow Up Note  10/17/2019 Name: Linda Rhodes MRN: [id number] DOB: 01-Dec-1975  Linda Rhodes is a 43 y.o. year old female who is a primary care patient of Hubbard Hartshorn, FNP. The CCM team was consulted for assistance with Mental Health Counseling and Resources.   Review of patient status, including review of consultants reports, other relevant assessments, and collaboration with appropriate care team members and the patient's provider was performed as part of comprehensive patient evaluation and provision of chronic care management services.    Advanced Directives Status: <no information> See Care Plan for related entries.   Outpatient Encounter Medications as of 10/16/2019  Medication Sig  . acetaminophen (TYLENOL) 500 MG tablet Take 1,000 mg by mouth every 6 (six) hours as needed for mild pain.  Marland Kitchen amLODipine (NORVASC) 10 MG tablet Take 1 tablet (10 mg total) by mouth daily.  . benzonatate (TESSALON) 100 MG capsule Take 1-2 capsules (100-200 mg total) by mouth 2 (two) times daily as needed.  . busPIRone (BUSPAR) 5 MG tablet Take 1 tablet twice daily  . chlorthalidone (HYGROTON) 25 MG tablet TAKE 1 TABLET BY MOUTH EVERY DAY  . escitalopram (LEXAPRO) 20 MG tablet Take 1 tablet (20 mg total) by mouth daily.  . hydrOXYzine (ATARAX/VISTARIL) 10 MG tablet Take 1 tablet (10 mg total) by mouth 3 (three) times daily as needed for anxiety.  . metoprolol succinate (TOPROL-XL) 50 MG 24 hr tablet Take 1 tablet (50 mg total) by mouth at bedtime. Take with or immediately following a meal.  . Promethazine-Codeine 6.25-10 MG/5ML SOLN Take 5 mLs by mouth every 6 (six) hours as needed.  Marland Kitchen spironolactone (ALDACTONE) 100 MG tablet Take 1 tablet (100 mg total) by mouth 3 (three) times daily.  . valACYclovir (VALTREX) 1000 MG tablet Take 1 tablet (1,000 mg total) by mouth 3 (three) times daily.   No facility-administered encounter medications on file as of  10/16/2019.     Goals Addressed            This Visit's Progress   . "I am having trouble dealing with my mother's death (pt-stated)       Current Barriers:  . Chronic Mental Health needs related to grief issues . Mental Health Concerns  . Suicidal Ideation/Homicidal Ideation: No  Clinical Social Work Goal(s):  Marland Kitchen Over the next 90 days, patient will follow up with grief counseling through [facility] as recommended by SW  Interventions: . Patient interviewed and appropriate assessments performed . Patient discussed current COVID-19 diagnosis and focus now is on getting well .  Supportive counseling and emotional support provided  . Patient encouraged to follow up with grief support program through [facility] and recommended calling them to set up an initial appointment-contact number provided [phone] when ready . Discussed plans with patient for ongoing care management follow up and provided patient with direct contact information for care management team if needed in the future   Patient Self Care Activities:  . Performs ADL's independently . Performs IADL's independently  Patient Coping Strengths:  . Supportive Relationships . Family . Able to Communicate Effectively  Patient Self Care Deficits:  Marland Kitchen Knowledge deficit regaridng local grief support programs  Please see past updates related to this goal by clicking on the "Past Updates" button in the selected goal          Follow Up Plan: Client will contact this social worker with any  questions or concerns regarding follow up with grief counseling with [facility] [place], [place] Worker  [facility] Care Management [phone]

## 2019-10-17 NOTE — Patient Instructions (Signed)
Thank you allowing the Chronic Care Management Team to be a part of your care! It was a pleasure speaking with you today!  1. Please contact grief support through [facility] when able 2. Please call this social worker with any questions or concerns regarding your mental health support needs.  CCM (Chronic Care Management) Team   Neldon Labella  RN, BSN Nurse Care Coordinator  [phone]  Ruben Reason PharmD  Clinical Pharmacist  [phone]   Elliot Gurney, LCSW Clinical Social Worker [phone]  Goals Addressed            This Visit's Progress   . "I am having trouble dealing with my mother's death (pt-stated)       Current Barriers:  . Chronic Mental Health needs related to grief issues . Mental Health Concerns  . Suicidal Ideation/Homicidal Ideation: No  Clinical Social Work Goal(s):  Marland Kitchen Over the next 90 days, patient will follow up with grief counseling through [facility] as recommended by SW  Interventions: . Patient interviewed and appropriate assessments performed . Patient discussed current COVID-19 diagnosis and focus now is on getting well .  Supportive counseling and emotional support provided  . Patient encouraged to follow up with grief support program through [facility] and recommended calling them to set up an initial appointment-contact number provided [phone] when ready . Discussed plans with patient for ongoing care management follow up and provided patient with direct contact information for care management team if needed in the future   Patient Self Care Activities:  . Performs ADL's independently . Performs IADL's independently  Patient Coping Strengths:  . Supportive Relationships . Family . Able to Communicate Effectively  Patient Self Care Deficits:  Marland Kitchen Knowledge deficit regaridng local grief support programs  Please see past updates related to this goal by clicking on the  "Past Updates" button in the selected goal          The patient verbalized understanding of instructions provided today and declined a print copy of patient instruction materials.   No further follow up required: patient will contact this Education officer, museum with any additional questions or concerns regarding  resources provided for grief support

## 2019-10-29 ENCOUNTER — Other Ambulatory Visit: Payer: Self-pay | Admitting: Family Medicine

## 2019-10-29 DIAGNOSIS — I10 Essential (primary) hypertension: Secondary | ICD-10-CM

## 2019-12-13 ENCOUNTER — Other Ambulatory Visit: Payer: Self-pay | Admitting: Family Medicine

## 2019-12-13 DIAGNOSIS — F4321 Adjustment disorder with depressed mood: Secondary | ICD-10-CM

## 2019-12-29 ENCOUNTER — Ambulatory Visit
Admission: EM | Admit: 2019-12-29 | Discharge: 2019-12-29 | Disposition: A | Discharge status: Home / Self Care | Payer: BLUE CROSS/BLUE SHIELD | Attending: Family Medicine | Admitting: Family Medicine

## 2019-12-29 ENCOUNTER — Other Ambulatory Visit: Payer: Self-pay

## 2019-12-29 ENCOUNTER — Encounter: Payer: Self-pay | Admitting: Emergency Medicine

## 2019-12-29 DIAGNOSIS — R519 Headache, unspecified: Secondary | ICD-10-CM | POA: Diagnosis not present

## 2019-12-29 DIAGNOSIS — B028 Zoster with other complications: Secondary | ICD-10-CM | POA: Diagnosis not present

## 2019-12-29 DIAGNOSIS — R112 Nausea with vomiting, unspecified: Secondary | ICD-10-CM | POA: Diagnosis not present

## 2019-12-29 DIAGNOSIS — I10 Essential (primary) hypertension: Secondary | ICD-10-CM

## 2019-12-29 MED ORDER — PROMETHAZINE HCL 25 MG PO TABS: 25.0000 mg | ORAL_TABLET | Freq: Four times a day (QID) | ORAL | 0 refills | Status: DC | PRN

## 2019-12-29 MED ORDER — PROMETHAZINE HCL 25 MG/ML IJ SOLN
25.0000 mg | Freq: Once | INTRAMUSCULAR | Status: AC
  Administered 2019-12-29: 25 mg via INTRAMUSCULAR

## 2019-12-29 MED ORDER — VALACYCLOVIR HCL 1 G PO TABS: 1000.0000 mg | ORAL_TABLET | Freq: Three times a day (TID) | ORAL | 0 refills | Status: AC

## 2019-12-29 MED ORDER — GABAPENTIN 300 MG PO CAPS: 300.0000 mg | ORAL_CAPSULE | Freq: Every day | ORAL | 0 refills | Status: DC

## 2019-12-29 NOTE — ED Triage Notes (Signed)
Patient states that she has shingles on her right side that started on Tuesday.  Patient c/o HA and vomiting that started this morning.  Patient denies fevers.  Patient states that she has been taking Tylenol for pain.

## 2019-12-29 NOTE — Discharge Instructions (Addendum)
Recommend start Valtrex 1000mg  every 8 hours for 7 days. May take Phenergan 25mg  every 6 hours as needed for nausea and vomiting. Slowly increase fluids and if able to keep fluids down, may start eating bland solid foods as tolerated. No acidic or spicy foods. Rest. May continue Tylenol 1000mg  every 8 hours as needed for headache or pain. Since you have been on Gabapentin before for Shingles nerve pain- recommend start Gabapentin 300mg  at night today and tomorrow- would then increase to twice a day but need to see your PCP for directions and continuation of medication. If pain worsens and unable to keep any fluids down, go to the ER ASAP. Otherwise, follow-up with your PCP on Monday (2 days) for recheck and continued medication management.

## 2019-12-30 NOTE — ED Provider Notes (Signed)
[facility]    CSN: [id number] Arrival date & time: 12/29/19  1026      History   Chief Complaint Chief Complaint  Patient presents with  . Rash  . Emesis  . Headache    HPI Linda Rhodes is a 44 y.o. female.   44 year old female presents with recurrence of Shingles on her left chest area under her arm. Started with the rash and pain about 5 days ago. Has also been experiencing fatigue, body aches and radiation of pain along rib area of chest and back. She has been taking Tylenol for pain with minimal relief. This morning, she started feeling worse with a headache, nausea and vomiting. She is uncertain if she is experiencing a migraine with GI symptoms or also has a GI viral illness. She has had loose stools this morning but denies any fever, coughing or numbness. She tried to drink Gatorade and take her BP medications this morning but she has been unable to keep anything down. The nurse offered her Zofran for nausea and vomiting while in exam room waiting for provider but she indicates Zofran usually makes her headache worse so she declined. She has taken Valtrex in the past for Shingles with good success. She has had at least 5 outbreaks of Shingles since 2016, with the last one prior to today in January 2020. She has needed Gabapentin for post herpetic neuralgia a few times and asks about restarting this medication today to help with pain. Other chronic health issues include HTN, GERD, migraine headaches, and chronic back pain. She is also experiencing mood changes and grief due to death of her Mom last summer from [place]. Current meds include Norvasc, Toprol XL, Aldactone, Hygroton, Protonix, Buspar, Lexapro daily and Atarax prn.    The history is provided by the patient.    Past Medical History:  Diagnosis Date  . Abscessed tooth 09/24/2016   right lower tooth removed.  . Anemia    in past, prior to removal of uterine fibroids  . Bladder infection   . Difficult  intubation   . GERD (gastroesophageal reflux disease)   . Headache    migrains  . Hypertension   . Motion sickness    all moving vehicles  . Ovarian cyst   . PONV (postoperative nausea and vomiting)     Patient Active Problem List   Diagnosis Date Noted  . Grief reaction 08/20/2019  . Essential hypertension 08/20/2019  . 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 Herpes zoster without complication [id number]  . Elevated blood pressure reading 07/18/2018  . Radiculopathy of lumbar region 12/15/2017  . Status post lumbar laminectomy 12/15/2017  . Intractable chronic migraine without aura and without status migrainosus 08/04/2016  . S/P hysterectomy 05/06/2015    Past Surgical History:  Procedure Laterality Date  . ABDOMINAL HYSTERECTOMY    . BACK SURGERY  2016   [facility] Neurosurgery - ruptured L4-5  . CESAREAN SECTION    . CHOLECYSTECTOMY    . CYSTOSCOPY N/A 05/06/2015   Procedure: CYSTOSCOPY;  Surgeon: Aletha Halim, MD;  Location: [facility] ORS;  Service: Gynecology;  Laterality: N/A;  . ESOPHAGOGASTRODUODENOSCOPY (EGD) WITH PROPOFOL N/A 04/13/2018   Procedure: ESOPHAGOGASTRODUODENOSCOPY (EGD) WITH PROPOFOL;  Surgeon: Lin Landsman, MD;  Location: [facility] ENDOSCOPY;  Service: Gastroenterology;  Laterality: N/A;  . ETHMOIDECTOMY Right 11/25/2015   Procedure:  ANTERIOR ETHMOIDECTOMY;  Surgeon: Carloyn Manner, MD;  Location: [place];  Service: ENT;  Laterality: Right;  .  FRONTAL SINUS EXPLORATION Right 11/25/2015   Procedure: FRONTAL SINUSOTOMY;  Surgeon: Carloyn Manner, MD;  Location: [place];  Service: ENT;  Laterality: Right;  . IMAGE GUIDED SINUS SURGERY N/A 11/25/2015   Procedure: IMAGE GUIDED SINUS SURGERY WITH BALLOON;  Surgeon: Carloyn Manner, MD;  Location: [place];  Service: ENT;  Laterality: N/A;  . LAPAROSCOPIC HYSTERECTOMY N/A 05/06/2015   Procedure: HYSTERECTOMY TOTAL LAPAROSCOPIC;  Surgeon: Aletha Halim, MD;  Location: [facility] ORS;  Service: Gynecology;  Laterality: N/A;  . LUMBAR LAMINECTOMY/DECOMPRESSION MICRODISCECTOMY N/A 10/04/2016   Procedure: right minimally invasive L5 S1 microdiscectomy;  Surgeon: Bayard Hugger, MD;  Location: [facility] ORS;  Service: Neurosurgery;  Laterality: N/A;  . MAXILLARY ANTROSTOMY Right 11/25/2015   Procedure: MAXILLARY ANTROSTOMY WITH TISSUE REMOVAL;  Surgeon: Carloyn Manner, MD;  Location: [place];  Service: ENT;  Laterality: Right;  . WISDOM TOOTH EXTRACTION  10/2115   3 teeth removed.    OB History   No obstetric history on file.      Home Medications    Prior to Admission medications   Medication Sig Start Date End Date Taking? Authorizing Provider  acetaminophen (TYLENOL) 500 MG tablet Take 1,000 mg by mouth every 6 (six) hours as needed for mild pain.   Yes [provider]  amLODipine (NORVASC) 10 MG tablet Take 1 tablet (10 mg total) by mouth daily. 09/18/19  Yes Hubbard Hartshorn, FNP  busPIRone (BUSPAR) 5 MG tablet Take 1 tablet twice daily 10/16/19  Yes Hubbard Hartshorn, FNP  chlorthalidone (HYGROTON) 25 MG tablet TAKE 1 TABLET BY MOUTH EVERY DAY 10/15/19  Yes Hubbard Hartshorn, FNP  escitalopram (LEXAPRO) 20 MG tablet TAKE 1 TABLET BY MOUTH EVERY DAY 12/13/19  Yes Hubbard Hartshorn, FNP  hydrOXYzine (ATARAX/VISTARIL) 10 MG tablet Take 1 tablet (10 mg total) by mouth 3 (three) times daily as needed for anxiety. 09/18/19  Yes Hubbard Hartshorn, FNP  metoprolol succinate (TOPROL-XL) 50 MG 24 hr tablet Take 1 tablet (50 mg total) by mouth at bedtime. Take with or immediately following a meal. 08/06/19  Yes Hubbard Hartshorn, FNP  spironolactone (ALDACTONE) 100 MG tablet Take 1 tablet (100 mg total) by mouth 3 (three) times daily. 11/14/18  Yes Poulose, Bethel Born, NP  gabapentin (NEURONTIN) 300 MG capsule Take 1 capsule (300 mg total) by mouth at bedtime. 12/29/19   Katy Apo, NP  pantoprazole (PROTONIX) 40 MG tablet Take 1 tablet (40 mg  total) by mouth 2 (two) times daily before a meal. 08/18/18 08/19/19  Vanga, Tally Due, MD  promethazine (PHENERGAN) 25 MG tablet Take 1 tablet (25 mg total) by mouth every 6 (six) hours as needed for nausea or vomiting. 12/29/19   Garima Chronis, Nicholes Stairs, NP  valACYclovir (VALTREX) 1000 MG tablet Take 1 tablet (1,000 mg total) by mouth 3 (three) times daily for 7 days. 12/29/19 01/05/20  Katy Apo, NP  famotidine (PEPCID) 20 MG tablet Take 1 tablet (20 mg total) by mouth 2 (two) times daily. 03/25/18 08/19/19  Carrie Mew, MD    Family History Family History  Problem Relation Age of Onset  . Diabetes Mother   . Hypertension Mother   . Diabetes Father   . Hypertension Father     Social History Social History   Tobacco Use  . Smoking status: Former Smoker    Packs/day: 0.50    Years: 7.00    Pack years: 3.50    Types: Cigarettes  Quit date: 10/19/2015    Years since quitting: 4.2  . Smokeless tobacco: Never Used  Substance Use Topics  . Alcohol use: Yes    Comment: socially - 1x/mo  . Drug use: No     Allergies   Aspirin and Adhesive [tape]   Review of Systems Review of Systems  Constitutional: Positive for activity change, appetite change, chills and fatigue. Negative for fever.  HENT: Negative for congestion, mouth sores and sore throat.   Eyes: Negative for photophobia, pain, discharge, redness, itching and visual disturbance.  Respiratory: Negative for cough, chest tightness and shortness of breath.   Cardiovascular: Positive for chest pain (left sided rib pain).  Gastrointestinal: Positive for diarrhea, nausea and vomiting. Negative for blood in stool.  Genitourinary: Negative for difficulty urinating, dysuria, flank pain and hematuria.  Musculoskeletal: Positive for arthralgias, back pain and myalgias.  Skin: Positive for rash. Negative for wound.  Neurological: Positive for dizziness, light-headedness and headaches. Negative for seizures, syncope, facial  asymmetry, weakness and numbness.  Hematological: Negative for adenopathy. Does not bruise/bleed easily.  Psychiatric/Behavioral: Positive for sleep disturbance.     Physical Exam Triage Vital Signs ED Triage Vitals  Enc Vitals Group     BP 12/29/19 1035 (!) 166/107     Pulse Rate 12/29/19 1035 95     Resp 12/29/19 1035 15     Temp 12/29/19 1035 97.8 F (36.6 C)     Temp Source 12/29/19 1035 Oral     SpO2 12/29/19 1035 100 %     Weight 12/29/19 1033 134 lb 14.7 oz (61.2 kg)     Height --      Head Circumference --      Peak Flow --      Pain Score 12/29/19 1033 8     Pain Loc --      Pain Edu? --      Excl. in [place]? --    No data found.  Updated Vital Signs BP (!) 166/107 (BP Location: Right Arm)   Pulse 95   Temp 97.8 F (36.6 C) (Oral)   Resp 15   Wt 134 lb 14.7 oz (61.2 kg)   LMP  (LMP Unknown)   SpO2 100%   BMI 21.13 kg/m   Visual Acuity Right Eye Distance:   Left Eye Distance:   Bilateral Distance:    Right Eye Near:   Left Eye Near:    Bilateral Near:     Physical Exam Vitals and nursing note reviewed.  Constitutional:      General: She is awake. She is not in acute distress.    Appearance: She is well-developed. She is ill-appearing.     Comments: Patient is sitting in exam chair holding emesis bag in her hand and appears very ill. She is also holding her head due to current headache/pain.   HENT:     Head: Normocephalic and atraumatic.     Right Ear: Hearing and external ear normal.     Left Ear: Hearing and external ear normal.     Nose: Nose normal.     Mouth/Throat:     Lips: Pink.     Mouth: Mucous membranes are dry.     Pharynx: Oropharynx is clear. Uvula midline.  Eyes:     Extraocular Movements: Extraocular movements intact.     Conjunctiva/sclera: Conjunctivae normal.     Pupils: Pupils are equal, round, and reactive to light.  Cardiovascular:     Rate and Rhythm: Normal rate and  regular rhythm.     Pulses: Normal pulses.     Heart  sounds: Normal heart sounds. No murmur.  Pulmonary:     Effort: Pulmonary effort is normal. No respiratory distress.     Breath sounds: Normal breath sounds and air entry. No decreased air movement. No decreased breath sounds, wheezing or rhonchi.    Chest:     Chest wall: Tenderness present.       Comments: Multiple pink to darker red vesicular lesions present on left mid-rib cage mid-axillary area. A few area open but many with crusting present. Very tender around entire area. No surrounding erythema. No signs of secondary bacterial infection.  Abdominal:     General: Bowel sounds are normal.     Palpations: Abdomen is soft.  Musculoskeletal:        General: Normal range of motion.     Cervical back: Normal range of motion.  Skin:    General: Skin is warm.     Capillary Refill: Capillary refill takes less than 2 seconds.     Findings: Rash present. No bruising, ecchymosis, erythema or petechiae. Rash is vesicular.  Neurological:     General: No focal deficit present.     Mental Status: She is alert and oriented to person, place, and time.  Psychiatric:        Attention and Perception: Attention normal.        Mood and Affect: Mood normal.        Speech: Speech normal.        Behavior: Behavior is slowed. Behavior is cooperative.        Thought Content: Thought content normal.        Cognition and Memory: Cognition normal.        Judgment: Judgment normal.      UC Treatments / Results  Labs (all labs ordered are listed, but only abnormal results are displayed) Labs Reviewed - No data to display  EKG   Radiology No results found.  Procedures Procedures (including critical care time)  Medications Ordered in UC Medications  promethazine (PHENERGAN) injection 25 mg (25 mg Intramuscular Given 12/29/19 1116)    Initial Impression / Assessment and Plan / UC Course  I have reviewed the triage vital signs and the nursing notes.  Pertinent labs & imaging results that  were available during my care of the patient were reviewed by me and considered in my medical decision making (see chart for details).    Since patient declined Zofran, will trial Phenergan 25mg  IM now- patient tolerated medication well and within 15 minutes, nausea had improved and she had stopped vomiting. She is resting and laying down on the exam table. Discussed with patient that uncertain if headache is a result of Shingles or a migraine or part of her GI upset. Recommend continue Phenergan 25mg  every 6 hours as needed. Restart Valtrex 1g 3 times a day for 7 days. Strongly encouraged patient to see her PCP and discuss multiple recurrences of Herpes Zoster (Shingles) to determine other ways to prevent flare-ups (under recommended age for Shingles vaccine but may need PCP to obtain approval). Rest. Slowly increase fluids and bland diet as tolerated. May continue Tylenol 1000mg  every 8 hours as needed for headache or pain. Will restart Gabapentin 300mg  at night today and tomorrow and then twice a day but need to see her PCP for directions and continuation of medication. Note written for work. Son here to take patient home by car. If pain worsens  or unable to keep any fluids down, go to the ER ASAP. Otherwise, follow-up with your PCP on Monday (2 days) for recheck and continued medication management.  Final Clinical Impressions(s) / UC Diagnoses   Final diagnoses:  Acute intractable headache, unspecified headache type  Nausea and vomiting in adult patient  Herpes zoster with other complication  Elevated blood pressure reading with diagnosis of hypertension     Discharge Instructions     Recommend start Valtrex 1000mg  every 8 hours for 7 days. May take Phenergan 25mg  every 6 hours as needed for nausea and vomiting. Slowly increase fluids and if able to keep fluids down, may start eating bland solid foods as tolerated. No acidic or spicy foods. Rest. May continue Tylenol 1000mg  every 8 hours as  needed for headache or pain. Since you have been on Gabapentin before for Shingles nerve pain- recommend start Gabapentin 300mg  at night today and tomorrow- would then increase to twice a day but need to see your PCP for directions and continuation of medication. If pain worsens and unable to keep any fluids down, go to the ER ASAP. Otherwise, follow-up with your PCP on Monday (2 days) for recheck and continued medication management.     ED Prescriptions    Medication Sig Dispense Auth. Provider   promethazine (PHENERGAN) 25 MG tablet Take 1 tablet (25 mg total) by mouth every 6 (six) hours as needed for nausea or vomiting. 30 tablet Katy Apo, NP   valACYclovir (VALTREX) 1000 MG tablet Take 1 tablet (1,000 mg total) by mouth 3 (three) times daily for 7 days. 21 tablet Katy Apo, NP   gabapentin (NEURONTIN) 300 MG capsule Take 1 capsule (300 mg total) by mouth at bedtime. 30 capsule Shaquanda Graves, Nicholes Stairs, NP     PDMP not reviewed this encounter.   Katy Apo, NP 12/30/19 1229

## 2020-03-15 ENCOUNTER — Other Ambulatory Visit: Payer: Self-pay

## 2020-03-15 ENCOUNTER — Encounter: Payer: Self-pay | Admitting: Emergency Medicine

## 2020-03-15 ENCOUNTER — Ambulatory Visit
Admission: EM | Admit: 2020-03-15 | Discharge: 2020-03-15 | Disposition: A | Discharge status: Home / Self Care | Payer: BLUE CROSS/BLUE SHIELD | Attending: Urgent Care | Admitting: Urgent Care

## 2020-03-15 DIAGNOSIS — H66003 Acute suppurative otitis media without spontaneous rupture of ear drum, bilateral: Secondary | ICD-10-CM | POA: Diagnosis not present

## 2020-03-15 MED ORDER — AMOXICILLIN-POT CLAVULANATE 875-125 MG PO TABS: 1.0000 | ORAL_TABLET | Freq: Two times a day (BID) | ORAL | 0 refills | Status: AC

## 2020-03-15 NOTE — ED Triage Notes (Signed)
Patient c/o bilateral ear pain that started on Tuesday.  Patient denies fevers.  Patient also reports past nasal drip and headache also.

## 2020-03-15 NOTE — Discharge Instructions (Addendum)
It was very nice seeing you today in clinic. Thank you for entrusting me with your care.   Rest and Stay HYDRATED. Water and electrolyte containing beverages (Gatorade, Pedialyte) are best to prevent dehydration and electrolyte abnormalities. May use Tylenol and/or Ibuprofen as needed for pain/fever.   Make arrangements to follow up with your regular doctor in 1 week for re-evaluation if not improving. If your symptoms/condition worsens, please seek follow up care either here or in the ER. Please remember, our [place] providers are "right here with you" when you need [place].   Again, it was my pleasure to take care of you today. Thank you for choosing our clinic. I hope that you start to feel better quickly.   Honor Loh, MSN, APRN, FNP-C, CEN Advanced Practice Provider [facility]

## 2020-03-16 NOTE — ED Provider Notes (Signed)
[place], [place]   Name: Linda Rhodes DOB: 01-Dec-1975 MRN: [id number] CSN: [id number] PCP: Hubbard Hartshorn, FNP  Arrival date and time:  03/15/20 [phone]  Chief Complaint:  Otalgia  NOTE: Prior to seeing the patient today, I have reviewed the triage nursing documentation and vital signs. Clinical staff has updated patient's PMH/PSHx, current medication list, and drug allergies/intolerances to ensure comprehensive history available to assist in medical decision making.   History:   HPI: Linda Rhodes is a 44 y.o. female who presents today with complaints of pain in her BILATERAL ears (R>L). Pain began with acute onset 4 days ago. She endorses fevers to a Tmax of 100. Patient has also experienced recent generalized headache, mild congestion, pressure behind her eyes, sore throat, and postnasal drip. She denies forceful nose blowing. Patient has not appreciated any otorrhea. She advises that her ability to hear from the RIGHT ear has acutely changed with the onset of the pain; describes hearing as being muffled. Patient denies history of recurrent ear infections. She has never had tympanostomy tubes in the past. Patient advising that she has not been swimming in the recent past. Patient denies the use of cotton tip swabs to clean her ears. Patient does not have a history of seasonal allergies.  In efforts to conservatively manage her symptoms at home, patient has used some OTC pain relieving otic drops, however notes that this intervention has been ineffective overall.  Past Medical History:  Diagnosis Date  . Abscessed tooth 09/24/2016   right lower tooth removed.  . Anemia    in past, prior to removal of uterine fibroids  . Bladder infection   . Difficult intubation   . GERD (gastroesophageal reflux disease)   . Headache    migrains  . Hypertension   . Motion sickness    all moving vehicles  . Ovarian cyst   . PONV (postoperative nausea and vomiting)     Past Surgical History:    Procedure Laterality Date  . ABDOMINAL HYSTERECTOMY    . BACK SURGERY  2016   [facility] - ruptured L4-5  . CESAREAN SECTION    . CHOLECYSTECTOMY    . CYSTOSCOPY N/A 05/06/2015   Procedure: CYSTOSCOPY;  Surgeon: Aletha Halim, MD;  Location: [facility];  Service: Gynecology;  Laterality: N/A;  . ESOPHAGOGASTRODUODENOSCOPY (EGD) WITH PROPOFOL N/A 04/13/2018   Procedure: ESOPHAGOGASTRODUODENOSCOPY (EGD) WITH PROPOFOL;  Surgeon: Lin Landsman, MD;  Location: [facility] ENDOSCOPY;  Service: Gastroenterology;  Laterality: N/A;  . ETHMOIDECTOMY Right 11/25/2015   Procedure:  ANTERIOR ETHMOIDECTOMY;  Surgeon: Carloyn Manner, MD;  Location: [place];  Service: ENT;  Laterality: Right;  . FRONTAL SINUS EXPLORATION Right 11/25/2015   Procedure: FRONTAL SINUSOTOMY;  Surgeon: Carloyn Manner, MD;  Location: [place];  Service: ENT;  Laterality: Right;  . IMAGE GUIDED SINUS SURGERY N/A 11/25/2015   Procedure: IMAGE GUIDED SINUS SURGERY WITH BALLOON;  Surgeon: Carloyn Manner, MD;  Location: [place];  Service: ENT;  Laterality: N/A;  . LAPAROSCOPIC HYSTERECTOMY N/A 05/06/2015   Procedure: HYSTERECTOMY TOTAL LAPAROSCOPIC;  Surgeon: Aletha Halim, MD;  Location: [facility];  Service: Gynecology;  Laterality: N/A;  . LUMBAR LAMINECTOMY/DECOMPRESSION MICRODISCECTOMY N/A 10/04/2016   Procedure: right minimally invasive L5 S1 microdiscectomy;  Surgeon: Bayard Hugger, MD;  Location: [facility];  Service: Neurosurgery;  Laterality: N/A;  . MAXILLARY ANTROSTOMY Right 11/25/2015   Procedure: MAXILLARY ANTROSTOMY WITH TISSUE REMOVAL;  Surgeon: Carloyn Manner, MD;  Location: [place];  Service:  ENT;  Laterality: Right;  . WISDOM TOOTH EXTRACTION  10/2115   3 teeth removed.    Family History  Problem Relation Age of Onset  . Diabetes Mother   . Hypertension Mother   . Diabetes Father   . Hypertension Father     Social History   Tobacco Use  . Smoking status:  Former Smoker    Packs/day: 0.50    Years: 7.00    Pack years: 3.50    Types: Cigarettes    Quit date: 10/19/2015    Years since quitting: 4.4  . Smokeless tobacco: Never Used  Substance Use Topics  . Alcohol use: Yes    Comment: socially - 1x/mo  . Drug use: No    Patient Active Problem List   Diagnosis Date Noted  . Grief reaction 08/20/2019  . Essential hypertension 08/20/2019  . 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 Herpes zoster without complication [id number]  . Elevated blood pressure reading 07/18/2018  . Radiculopathy of lumbar region 12/15/2017  . Status post lumbar laminectomy 12/15/2017  . Intractable chronic migraine without aura and without status migrainosus 08/04/2016  . S/P hysterectomy 05/06/2015    Home Medications:    Current Meds  Medication Sig  . amLODipine (NORVASC) 10 MG tablet Take 1 tablet (10 mg total) by mouth daily.  . busPIRone (BUSPAR) 5 MG tablet Take 1 tablet twice daily  . chlorthalidone (HYGROTON) 25 MG tablet TAKE 1 TABLET BY MOUTH EVERY DAY  . escitalopram (LEXAPRO) 20 MG tablet TAKE 1 TABLET BY MOUTH EVERY DAY  . gabapentin (NEURONTIN) 300 MG capsule Take 1 capsule (300 mg total) by mouth at bedtime.  . hydrOXYzine (ATARAX/VISTARIL) 10 MG tablet Take 1 tablet (10 mg total) by mouth 3 (three) times daily as needed for anxiety.  . metoprolol succinate (TOPROL-XL) 50 MG 24 hr tablet Take 1 tablet (50 mg total) by mouth at bedtime. Take with or immediately following a meal.  . spironolactone (ALDACTONE) 100 MG tablet Take 1 tablet (100 mg total) by mouth 3 (three) times daily.    Allergies:   Aspirin and Adhesive [tape]  Review of Systems (ROS):  Review of systems NEGATIVE unless otherwise noted in narrative H&P section.   Vital Signs: Today's Vitals   03/15/20 1005 03/15/20 1006 03/15/20 1008 03/15/20 1037  BP:   (!) 139/97   Pulse:   81   Resp:   14   Temp:   98.2 F (36.8 C)   TempSrc:    Oral   SpO2:   100%   Weight:  135 lb (61.2 kg)    Height:  5\' 8"  (1.727 m)    PainSc: 5    5     Physical Exam: Physical Exam  Constitutional: She is oriented to person, place, and time and well-developed, well-nourished, and in no distress.  HENT:  Head: Normocephalic and atraumatic.  Right Ear: There is tenderness. Tympanic membrane is erythematous and bulging. A middle ear effusion is present.  Left Ear: There is tenderness. Tympanic membrane is injected. A middle ear effusion is present.  Nose: Mucosal edema and rhinorrhea present. No sinus tenderness.  Mouth/Throat: Uvula is midline and mucous membranes are normal. Posterior oropharyngeal erythema present. No oropharyngeal exudate or posterior oropharyngeal edema.  Eyes: Pupils are equal, round, and reactive to light.  Cardiovascular: Normal rate, regular rhythm, normal heart sounds and intact distal pulses.  Pulmonary/Chest: Effort normal and breath sounds normal.  No cough noted in clinic. No  SOB or increased WOB. No distress. Able to speak in complete sentences without difficulties. SPO2 100% on RA.  Lymphadenopathy:       Head (right side): Submandibular adenopathy present.  Neurological: She is alert and oriented to person, place, and time. Gait normal.  Skin: Skin is warm and dry. No rash noted. She is not diaphoretic.  Psychiatric: Mood, memory, affect and judgment normal.  Nursing note and vitals reviewed.   Urgent Care Treatments / Results:   No orders of the defined types were placed in this encounter.   LABS: PLEASE NOTE: all labs that were ordered this encounter are listed, however only abnormal results are displayed. Labs Reviewed - No data to display  EKG: -None  RADIOLOGY: No results found.  PROCEDURES: Procedures  MEDICATIONS RECEIVED THIS VISIT: Medications - No data to display  PERTINENT CLINICAL COURSE NOTES/UPDATES:   Initial Impression / Assessment and Plan / Urgent Care Course:    Pertinent labs & imaging results that were available during my care of the patient were personally reviewed by me and considered in my medical decision making (see lab/imaging section of note for values and interpretations).  NEIDY WINKELMAN is a 44 y.o. female who presents to [facility] today with complaints of Otalgia  Patient is well appearing overall in clinic today. She does not appear to be in any acute distress. Presenting symptoms (see HPI) and exam as documented above.  Exam consistent with AOM in patients BILATERAL ears, with the RIGHT being significantly worse as compared to the LEFT.  She is attempted OTC interventions without improvement.  Treating with a 10-day course of oral amoxicillin-clavulanate.  She was encouraged to keep her ears clean and dry. Reviewed supportive care measures; rest, increased hydration, warm salt water gargles, hard candies/lozenges, and hot tea with honey/lemon to help soothe the throat and reduce irritation. May use Tylenol and/or Ibuprofen as needed for pain/fever.   Current clinical condition warrants patient being out of work in order to recover from her current injury/illness. She was provided with the appropriate documentation to provide to her place of employment that will allow for her to RTW on 03/18/2020 with no restrictions.   Discussed follow up with primary care physician in 1 week for re-evaluation. I have reviewed the follow up and strict return precautions for any new or worsening symptoms. Patient is aware of symptoms that would be deemed urgent/emergent, and would thus require further evaluation either here or in the emergency department. At the time of discharge, she verbalized understanding and consent with the discharge plan as it was reviewed with her. All questions were fielded by provider and/or clinic staff prior to patient discharge.    Final Clinical Impressions / Urgent Care Diagnoses:   Final diagnoses:  Non-recurrent acute  suppurative otitis media of both ears without spontaneous rupture of tympanic membranes    New Prescriptions:  [place] Controlled Substance Registry consulted? Not Applicable  Meds ordered this encounter  Medications  . amoxicillin-clavulanate (AUGMENTIN) 875-125 MG tablet    Sig: Take 1 tablet by mouth 2 (two) times daily for 10 days.    Dispense:  20 tablet    Refill:  0    Recommended Follow up Care:  Patient encouraged to follow up with the following provider within the specified time frame, or sooner as dictated by the severity of her symptoms. As always, she was instructed that for any urgent/emergent care needs, she should seek care either here or in the emergency department  for more immediate evaluation.  Follow-up Information    Hubbard Hartshorn, FNP In 1 week.   Specialty: Family Medicine Why: General reassessment of symptoms if not improving Contact information: [address] [place] [address] [phone]         NOTE: This note was prepared using Dragon dictation software along with smaller phrase technology. Despite my best ability to proofread, there is the potential that transcriptional errors may still occur from this process, and are completely unintentional.    Karen Kitchens, NP 03/16/20 [phone]

## 2020-04-12 ENCOUNTER — Other Ambulatory Visit: Payer: Self-pay

## 2020-04-12 ENCOUNTER — Ambulatory Visit
Admission: EM | Admit: 2020-04-12 | Discharge: 2020-04-12 | Disposition: A | Discharge status: Home / Self Care | Payer: BLUE CROSS/BLUE SHIELD | Attending: Emergency Medicine | Admitting: Emergency Medicine

## 2020-04-12 ENCOUNTER — Encounter: Payer: Self-pay | Admitting: Emergency Medicine

## 2020-04-12 DIAGNOSIS — E2609 Other primary hyperaldosteronism: Secondary | ICD-10-CM | POA: Diagnosis present

## 2020-04-12 DIAGNOSIS — R519 Headache, unspecified: Secondary | ICD-10-CM | POA: Diagnosis present

## 2020-04-12 DIAGNOSIS — Z9189 Other specified personal risk factors, not elsewhere classified: Secondary | ICD-10-CM | POA: Diagnosis present

## 2020-04-12 DIAGNOSIS — R52 Pain, unspecified: Secondary | ICD-10-CM | POA: Insufficient documentation

## 2020-04-12 DIAGNOSIS — I152 Hypertension secondary to endocrine disorders: Secondary | ICD-10-CM | POA: Insufficient documentation

## 2020-04-12 DIAGNOSIS — E876 Hypokalemia: Secondary | ICD-10-CM | POA: Diagnosis present

## 2020-04-12 DIAGNOSIS — Z760 Encounter for issue of repeat prescription: Secondary | ICD-10-CM | POA: Diagnosis present

## 2020-04-12 DIAGNOSIS — I10 Essential (primary) hypertension: Secondary | ICD-10-CM | POA: Diagnosis not present

## 2020-04-12 MED ORDER — SPIRONOLACTONE 100 MG PO TABS: 100.0000 mg | ORAL_TABLET | Freq: Three times a day (TID) | ORAL | 1 refills | Status: DC

## 2020-04-12 MED ORDER — METOPROLOL SUCCINATE ER 50 MG PO TB24: 50.0000 mg | ORAL_TABLET | Freq: Every day | ORAL | 1 refills | Status: DC

## 2020-04-12 MED ORDER — CHLORTHALIDONE 25 MG PO TABS: 25.0000 mg | ORAL_TABLET | Freq: Every day | ORAL | 1 refills | Status: DC

## 2020-04-12 MED ORDER — IBUPROFEN 600 MG PO TABS
600.0000 mg | ORAL_TABLET | Freq: Four times a day (QID) | ORAL | 0 refills | Status: DC | PRN
Start: 2020-04-12 — End: 2021-03-25

## 2020-04-12 MED ORDER — DOXYCYCLINE HYCLATE 100 MG PO CAPS
100.0000 mg | ORAL_CAPSULE | Freq: Two times a day (BID) | ORAL | 0 refills | Status: AC
Start: 2020-04-12 — End: 2020-04-22

## 2020-04-12 NOTE — ED Triage Notes (Signed)
Patient in today with several tick bites. Patient states she went fishing 04/09/20 and removed a tick off her neck and went fishing again the next day and removed 2 ticks from her arm and leg. Patient now c/o headache and body aches.

## 2020-04-12 NOTE — Discharge Instructions (Addendum)
You need to be on the doxycycline for at least 3 days after you have stopped having fevers without the use of Tylenol/ibuprofen.  In the meantime, you can take 600 mg of ibuprofen combined with 1000 mg of Tylenol 3-4 times a day as needed for headache, body aches.  Finish the doxycycline even if you feel better.  We will extend your course of antibiotics if your Lyme titers come back positive.  Make absolutely sure that I have given you the correct dosing and frequency of the chlorthalidone, spironolactone, Toprol XR.  Continue your other medications.  You need to follow-up with your doctor in several days to make sure that your blood pressure is under good control and make sure that you are getting better. Decrease your salt intake. diet and exercise will lower your blood pressure significantly. It is important to keep your blood pressure under good control, as having a elevated blood pressure for prolonged periods of time significantly increases your risk of stroke, heart attacks, kidney damage, eye damage, and other problems. Measure your blood pressure once a day, preferably at the same time every day. Keep a log of this and bring it to your next doctor's appointment.  Bring your blood pressure cuff as well. Return immediately to the ER if you start having chest pain, headache, problems seeing, problems talking, problems walking, if you feel like you're about to pass out, if you do pass out, if you have a seizure, or for any other concerns.  Go to www.goodrx.com to look up your medications. This will give you a list of where you can find your prescriptions at the most affordable prices. Or ask the pharmacist what the cash price is, or if they have any other discount programs available to help make your medication more affordable. This can be less expensive than what you would pay with insurance.

## 2020-04-12 NOTE — ED Provider Notes (Signed)
HPI  SUBJECTIVE:  Linda Rhodes is a 44 y.o. female who reports gradual onset right-sided constant headaches, with photophobia, fevers T-max 100, body aches, fatigue for the past 3 days.  Also reports a painful mass posterior right neck.  States that she removed 3 engorged ticks 4 days ago, 1 on the back of her neck, 1 in the left AC fossa, one in the right popliteal fossa.  She is not sure what kind of ticks they were.  Was able to remove them in their entirety.  She reports a mild cough.  No wheezing, chest pain, shortness of breath.  No rash surrounding the tick bites or anywhere else.  No neck stiffness.  No blurry vision, arm or leg weakness, facial droop, slurred speech, discoordination.  She reports nausea, but no vomiting.  Some diarrhea.  No abdominal pain.  No loss of sense of smell or taste, sore throat, nasal congestion.  No ear pain, dental pain.  No known Covid exposure.  She has not yet gotten the Covid vaccine.  She has had headaches like this before when she had [facility] spotted fever 10 years ago.  She did not get a rash with that episode.  She has been taking at 1000 mg of Tylenol 3 times daily which helps minimally, symptoms are worse with light.  She has a past medical history of hypertension, states that she took her amlodipine and hydrochlorothiazide today.  She ran out of her metoprolol, spironolactone and chlorthalidone 2 weeks ago.  She states that this headache is not similar to headaches that she gets when her blood pressure is elevated.  She has a history of migraines.  No history of alpha gal, Lyme disease, coronary artery disease, diabetes, chronic kidney disease.  LMP: Status post hysterectomy.  BHA:LPFXT, Astrid Divine, FNP     Past Medical History:  Diagnosis Date  . Abscessed tooth 09/24/2016   right lower tooth removed.  . Anemia    in past, prior to removal of uterine fibroids  . Bladder infection   . Difficult intubation   . GERD (gastroesophageal reflux  disease)   . Headache    migrains  . Hypertension   . Motion sickness    all moving vehicles  . Ovarian cyst   . PONV (postoperative nausea and vomiting)     Past Surgical History:  Procedure Laterality Date  . ABDOMINAL HYSTERECTOMY    . BACK SURGERY  2016   [facility] - ruptured L4-5  . CESAREAN SECTION    . CHOLECYSTECTOMY    . CYSTOSCOPY N/A 05/06/2015   Procedure: CYSTOSCOPY;  Surgeon: Aletha Halim, MD;  Location: [facility];  Service: Gynecology;  Laterality: N/A;  . ESOPHAGOGASTRODUODENOSCOPY (EGD) WITH PROPOFOL N/A 04/13/2018   Procedure: ESOPHAGOGASTRODUODENOSCOPY (EGD) WITH PROPOFOL;  Surgeon: Lin Landsman, MD;  Location[facility];  Service: Gastroenterology;  Laterality: N/A;  . ETHMOIDECTOMY Right 11/25/2015   Procedure:  ANTERIOR ETHMOIDECTOMY;  Surgeon: Carloyn Manner, MD;  Location: [place];  Service: ENT;  Laterality: Right;  . FRONTAL SINUS EXPLORATION Right 11/25/2015   Procedure: FRONTAL SINUSOTOMY;  Surgeon: Carloyn Manner, MD;  Location: [place];  Service: ENT;  Laterality: Right;  . IMAGE GUIDED SINUS SURGERY N/A 11/25/2015   Procedure: IMAGE GUIDED SINUS SURGERY WITH BALLOON;  Surgeon: Carloyn Manner, MD;  Location: [place];  Service: ENT;  Laterality: N/A;  . LAPAROSCOPIC HYSTERECTOMY N/A 05/06/2015   Procedure: HYSTERECTOMY TOTAL LAPAROSCOPIC;  Surgeon: Aletha Halim, MD;  Location:  [facility];  Service: Gynecology;  Laterality: N/A;  . LUMBAR LAMINECTOMY/DECOMPRESSION MICRODISCECTOMY N/A 10/04/2016   Procedure: right minimally invasive L5 S1 microdiscectomy;  Surgeon: Bayard Hugger, MD;  Location: [facility];  Service: Neurosurgery;  Laterality: N/A;  . MAXILLARY ANTROSTOMY Right 11/25/2015   Procedure: MAXILLARY ANTROSTOMY WITH TISSUE REMOVAL;  Surgeon: Carloyn Manner, MD;  Location: [place];  Service: ENT;  Laterality: Right;  . WISDOM TOOTH EXTRACTION  10/2115   3 teeth removed.     Family History  Problem Relation Age of Onset  . Diabetes Mother   . Hypertension Mother   . Other Mother        Covid death  . Diabetes Father   . Hypertension Father   . Stroke Father   . Heart attack Father     Social History   Tobacco Use  . Smoking status: Former Smoker    Packs/day: 0.50    Years: 7.00    Pack years: 3.50    Types: Cigarettes    Quit date: 10/19/2015    Years since quitting: 4.4  . Smokeless tobacco: Never Used  Vaping Use  . Vaping Use: Never used  Substance Use Topics  . Alcohol use: Yes    Comment: socially - 1x/mo  . Drug use: No    No current facility-administered medications for this encounter.  Current Outpatient Medications:  .  acetaminophen (TYLENOL) 500 MG tablet, Take 1,000 mg by mouth every 6 (six) hours as needed for mild pain., Disp: , Rfl:  .  amLODipine (NORVASC) 10 MG tablet, Take 1 tablet (10 mg total) by mouth daily., Disp: 90 tablet, Rfl: 3 .  busPIRone (BUSPAR) 5 MG tablet, Take 1 tablet twice daily, Disp: 180 tablet, Rfl: 1 .  escitalopram (LEXAPRO) 20 MG tablet, TAKE 1 TABLET BY MOUTH EVERY DAY, Disp: 90 tablet, Rfl: 0 .  promethazine (PHENERGAN) 25 MG tablet, Take 1 tablet (25 mg total) by mouth every 6 (six) hours as needed for nausea or vomiting., Disp: 30 tablet, Rfl: 0 .  chlorthalidone (HYGROTON) 25 MG tablet, Take 1 tablet (25 mg total) by mouth daily., Disp: 30 tablet, Rfl: 1 .  doxycycline (VIBRAMYCIN) 100 MG capsule, Take 1 capsule (100 mg total) by mouth 2 (two) times daily for 10 days., Disp: 20 capsule, Rfl: 0 .  gabapentin (NEURONTIN) 300 MG capsule, Take 1 capsule (300 mg total) by mouth at bedtime., Disp: 30 capsule, Rfl: 0 .  hydrOXYzine (ATARAX/VISTARIL) 10 MG tablet, Take 1 tablet (10 mg total) by mouth 3 (three) times daily as needed for anxiety., Disp: 30 tablet, Rfl: 0 .  ibuprofen (ADVIL) 600 MG tablet, Take 1 tablet (600 mg total) by mouth every 6 (six) hours as needed., Disp: 30 tablet, Rfl: 0 .   metoprolol succinate (TOPROL-XL) 50 MG 24 hr tablet, Take 1 tablet (50 mg total) by mouth at bedtime. Take with or immediately following a meal., Disp: 30 tablet, Rfl: 1 .  pantoprazole (PROTONIX) 40 MG tablet, Take 1 tablet (40 mg total) by mouth 2 (two) times daily before a meal., Disp: 180 tablet, Rfl: 0 .  spironolactone (ALDACTONE) 100 MG tablet, Take 1 tablet (100 mg total) by mouth 3 (three) times daily., Disp: 30 tablet, Rfl: 1  Allergies  Allergen Reactions  . Aspirin Other (See Comments)    Causes burning feeling in her stomach.   . Adhesive [Tape] Rash    And skin tears from stronger tapes     ROS  As  noted in HPI.   Physical Exam  BP (!) 150/101 (BP Location: Right Arm) Comment: Patient out of some of her HTN meds  Pulse 83   Temp 99.1 F (37.3 C) (Oral)   Resp 18   Ht 5\' 9"  (1.753 m)   Wt 59 kg   LMP  (LMP Unknown)   SpO2 100%   BMI 19.20 kg/m   Constitutional: Well developed, well nourished, appears ill.  Sitting in darkened room. Eyes: PERRL, EOMI, conjunctiva normal bilaterally.  Positive photophobia bilaterally HENT: Normocephalic, atraumatic,mucus membranes moist,  TM normal b/l.  No nasal congestion, no sinus tenderness. No temporal artery tenderness.  Neck: Positive single tender posterior right-sided cervical lymph node.  No other lymphadenopathy. No meningismus Respiratory: normal inspiratory effort, lungs clear bilaterally Cardiovascular: Normal rate, regular rhythm, no murmurs, rubs, gallops. GI:  nondistended skin: 3 raised tender nonerythematous lesions: 1 on right scalp, 1 left AC fossa, 1 right popliteal fossa.  No retained foreign body noted.  No surrounding bull's-eye rash.  No expressible purulent drainage.  No rash on the palms of her hands. Musculoskeletal: No edema, no tenderness, no deformities Neurologic: Alert & oriented x 3, CN III-XII intact, romberg neg, finger-> nose, heel-> shin equal b/l, tandem gait steady Psychiatric: Speech and  behavior appropriate   ED Course  Medications - No data to display  Orders Placed This Encounter  Procedures  . [place] spotted fvr abs pnl(IgG+IgM)    Standing Status:   Standing    Number of Occurrences:   1  . B. burgdorfi antibodies    Standing Status:   Standing    Number of Occurrences:   1   No results found for this or any previous visit (from the past 24 hour(s)). No results found.   ED Clinical Impression  1.  At high risk for tickborne illness. 2.  Acute non-intractable headache, unspecified headache type 3.  Body aches. 4.  Essential hypertension 5.  Medication refill.   ED Assessment/Plan  Concern for tickborne illness such as RMSF or Lyme.    No evidence of meningitis. Doubt SAH, ICH or space occupying lesion.  Pt with normal neuro exam, no evidence of CVA/TIA.  No evidence of temporal artery tenderness, no evidence of glaucoma or other ocular pathology. Doubt hypertensive emergency-states this headache is much different than the headache she gets with elevated blood pressure.  She is on 5 different blood pressure medications, she is currently taking the amlodipine and hydrochlorothiazide.  States that she ran out of the metoprolol XR, spironolactone and chlorthalidone 2 weeks ago. will refill these.  Went over doses with her and she confirmed that these are the correct doses.  States that she still has amlodipine and hydrochlorothiazide.  Advised her to keep a log of her blood pressure.  She will need to follow-up with her primary care physician for reevaluation in 3 days.  Gave her signs and symptoms of a hypertensive emergency.  Sending off Lyme titers.  Checked with lab, Lyme antibodies with reflex is indicated for detection of early disease.  Also sending off RMSF titers, however am not sure how useful these will be given the remote history of RMSF 10 years ago.  Concern for [facility] spotted fever/tickborne illness.  Home with 10 days of doxycycline p.o.  twice daily, Tylenol/ibuprofen.  Will extend doxycycline treatment out if Lyme titers come back positive.  Follow-up with PMD in several days, especially for management of blood pressure.  To the ED if  symptoms get worse.  Patient agrees with plan  Meds ordered this encounter  Medications  . chlorthalidone (HYGROTON) 25 MG tablet    Sig: Take 1 tablet (25 mg total) by mouth daily.    Dispense:  30 tablet    Refill:  1  . metoprolol succinate (TOPROL-XL) 50 MG 24 hr tablet    Sig: Take 1 tablet (50 mg total) by mouth at bedtime. Take with or immediately following a meal.    Dispense:  30 tablet    Refill:  1  . spironolactone (ALDACTONE) 100 MG tablet    Sig: Take 1 tablet (100 mg total) by mouth 3 (three) times daily.    Dispense:  30 tablet    Refill:  1  . doxycycline (VIBRAMYCIN) 100 MG capsule    Sig: Take 1 capsule (100 mg total) by mouth 2 (two) times daily for 10 days.    Dispense:  20 capsule    Refill:  0  . ibuprofen (ADVIL) 600 MG tablet    Sig: Take 1 tablet (600 mg total) by mouth every 6 (six) hours as needed.    Dispense:  30 tablet    Refill:  0    *This clinic note was created using [employer]. Therefore, there may be occasional mistakes despite careful proofreading.  ?    Melynda Ripple, MD 04/12/20 1136

## 2020-04-14 LAB — B. BURGDORFI ANTIBODIES: B burgdorferi Ab IgG+IgM: <0.91 {ISR} (ref 0.00–0.90)

## 2020-04-17 LAB — ROCKY MTN SPOTTED FVR ABS PNL(IGG+IGM)
RMSF IgG: NEGATIVE
RMSF IgM: 0.21 index (ref 0.00–0.89)

## 2020-08-01 ENCOUNTER — Ambulatory Visit: Payer: Self-pay

## 2020-08-01 ENCOUNTER — Ambulatory Visit (INDEPENDENT_AMBULATORY_CARE_PROVIDER_SITE_OTHER): Payer: BLUE CROSS/BLUE SHIELD

## 2020-08-01 ENCOUNTER — Other Ambulatory Visit: Payer: Self-pay

## 2020-08-01 ENCOUNTER — Ambulatory Visit
Admission: EM | Admit: 2020-08-01 | Discharge: 2020-08-01 | Disposition: A | Discharge status: Home / Self Care | Payer: BLUE CROSS/BLUE SHIELD | Attending: Physician Assistant | Admitting: Physician Assistant

## 2020-08-01 ENCOUNTER — Encounter: Payer: Self-pay | Admitting: Emergency Medicine

## 2020-08-01 DIAGNOSIS — M545 Low back pain, unspecified: Secondary | ICD-10-CM

## 2020-08-01 DIAGNOSIS — M5441 Lumbago with sciatica, right side: Secondary | ICD-10-CM | POA: Diagnosis not present

## 2020-08-01 DIAGNOSIS — S39012A Strain of muscle, fascia and tendon of lower back, initial encounter: Secondary | ICD-10-CM

## 2020-08-01 MED ORDER — CYCLOBENZAPRINE HCL 10 MG PO TABS
10.0000 mg | ORAL_TABLET | Freq: Three times a day (TID) | ORAL | 0 refills | Status: AC | PRN
Start: 2020-08-01 — End: 2020-08-11

## 2020-08-01 MED ORDER — KETOROLAC TROMETHAMINE 60 MG/2ML IM SOLN
60.0000 mg | Freq: Once | INTRAMUSCULAR | Status: AC
  Administered 2020-08-01: 60 mg via INTRAMUSCULAR

## 2020-08-01 MED ORDER — PREDNISONE 10 MG PO TABS
ORAL_TABLET | ORAL | 0 refills | Status: DC
Start: 2020-08-01 — End: 2020-11-14

## 2020-08-01 MED ORDER — HYDROCODONE-ACETAMINOPHEN 5-325 MG PO TABS: 1.0000 | ORAL_TABLET | Freq: Four times a day (QID) | ORAL | 0 refills | Status: AC | PRN

## 2020-08-01 NOTE — ED Provider Notes (Signed)
[facility]    CSN: [id number] Arrival date & time: 08/01/20  1518      History   Chief Complaint Chief Complaint  Patient presents with  . Back Pain    HPI Linda Rhodes is a 44 y.o. female presenting for right-sided back pain radiating to the right foot with sudden onset of lifting her father very early this morning.  Patient says that he has dementia and he got out and she had found him in a ditch and lifted him out of it.  Says she had severe pain starting right after that.  Patient mitts to history of back problems and says that she has had 3 back surgeries with the last back surgery in 2016.  She says in 2016 she had lumbar fusion.  Patient says she has taken Tylenol for pain without relief.  She says that she has some numbness and tingling of her first and second toes.  Pain is worse with standing and walking and lifting the right leg.  Patient denies falling or any specific trauma to her back.  She denies any weakness in the legs, saddle anesthesia or loss of bowel or bladder control.  She has no other injuries, complaints or concerns.  HPI  Past Medical History:  Diagnosis Date  . Abscessed tooth 09/24/2016   right lower tooth removed.  . Anemia    in past, prior to removal of uterine fibroids  . Bladder infection   . Difficult intubation   . GERD (gastroesophageal reflux disease)   . Headache    migrains  . Hypertension   . Motion sickness    all moving vehicles  . Ovarian cyst   . PONV (postoperative nausea and vomiting)     Patient Active Problem List   Diagnosis Date Noted  . Grief reaction 08/20/2019  . Essential hypertension 08/20/2019  . 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 Herpes zoster without complication 51/70/0174  . Elevated blood pressure reading 07/18/2018  . Radiculopathy of lumbar region 12/15/2017  . Status post lumbar laminectomy 12/15/2017  . Intractable chronic migraine without aura and  without status migrainosus 08/04/2016  . S/P hysterectomy 05/06/2015    Past Surgical History:  Procedure Laterality Date  . ABDOMINAL HYSTERECTOMY    . BACK SURGERY  2016   [facility] - ruptured L4-5  . CESAREAN SECTION    . CHOLECYSTECTOMY    . CYSTOSCOPY N/A 05/06/2015   Procedure: CYSTOSCOPY;  Surgeon: Aletha Halim, MD;  Location: [facility];  Service: Gynecology;  Laterality: N/A;  . ESOPHAGOGASTRODUODENOSCOPY (EGD) WITH PROPOFOL N/A 04/13/2018   Procedure: ESOPHAGOGASTRODUODENOSCOPY (EGD) WITH PROPOFOL;  Surgeon: Lin Landsman, MD;  Location: [facility];  Service: Gastroenterology;  Laterality: N/A;  . ETHMOIDECTOMY Right 11/25/2015   Procedure:  ANTERIOR ETHMOIDECTOMY;  Surgeon: Carloyn Manner, MD;  Location: [place];  Service: ENT;  Laterality: Right;  . FRONTAL SINUS EXPLORATION Right 11/25/2015   Procedure: FRONTAL SINUSOTOMY;  Surgeon: Carloyn Manner, MD;  Location: [place];  Service: ENT;  Laterality: Right;  . IMAGE GUIDED SINUS SURGERY N/A 11/25/2015   Procedure: IMAGE GUIDED SINUS SURGERY WITH BALLOON;  Surgeon: Carloyn Manner, MD;  Location: [place];  Service: ENT;  Laterality: N/A;  . LAPAROSCOPIC HYSTERECTOMY N/A 05/06/2015   Procedure: HYSTERECTOMY TOTAL LAPAROSCOPIC;  Surgeon: Aletha Halim, MD;  Location: [facility];  Service: Gynecology;  Laterality: N/A;  . LUMBAR LAMINECTOMY/DECOMPRESSION MICRODISCECTOMY N/A 10/04/2016   Procedure: right minimally invasive L5  S1 microdiscectomy;  Surgeon: Bayard Hugger, MD;  Location: [facility];  Service: Neurosurgery;  Laterality: N/A;  . MAXILLARY ANTROSTOMY Right 11/25/2015   Procedure: MAXILLARY ANTROSTOMY WITH TISSUE REMOVAL;  Surgeon: Carloyn Manner, MD;  Location: [place];  Service: ENT;  Laterality: Right;  . WISDOM TOOTH EXTRACTION  10/2115   3 teeth removed.    OB History   No obstetric history on file.      Home Medications    Prior to Admission  medications   Medication Sig Start Date End Date Taking? Authorizing Provider  amLODipine (NORVASC) 10 MG tablet Take 1 tablet (10 mg total) by mouth daily. 09/18/19  Yes Hubbard Hartshorn, FNP  busPIRone (BUSPAR) 5 MG tablet Take 1 tablet twice daily 10/16/19  Yes Hubbard Hartshorn, FNP  chlorthalidone (HYGROTON) 25 MG tablet Take 1 tablet (25 mg total) by mouth daily. 04/12/20  Yes Melynda Ripple, MD  escitalopram (LEXAPRO) 20 MG tablet TAKE 1 TABLET BY MOUTH EVERY DAY 12/13/19  Yes Hubbard Hartshorn, FNP  ibuprofen (ADVIL) 600 MG tablet Take 1 tablet (600 mg total) by mouth every 6 (six) hours as needed. 04/12/20  Yes Melynda Ripple, MD  metoprolol succinate (TOPROL-XL) 50 MG 24 hr tablet Take 1 tablet (50 mg total) by mouth at bedtime. Take with or immediately following a meal. 04/12/20  Yes Melynda Ripple, MD  spironolactone (ALDACTONE) 100 MG tablet Take 1 tablet (100 mg total) by mouth 3 (three) times daily. 04/12/20  Yes Melynda Ripple, MD  cyclobenzaprine (FLEXERIL) 10 MG tablet Take 1 tablet (10 mg total) by mouth 3 (three) times daily as needed for up to 10 days for muscle spasms. 08/01/20 08/11/20  Laurene Footman B, PA-C  gabapentin (NEURONTIN) 300 MG capsule Take 1 capsule (300 mg total) by mouth at bedtime. 12/29/19   Katy Apo, NP  HYDROcodone-acetaminophen (NORCO/VICODIN) 5-325 MG tablet Take 1 tablet by mouth every 6 (six) hours as needed for up to 5 days. 08/01/20 08/06/20  Laurene Footman B, PA-C  hydrOXYzine (ATARAX/VISTARIL) 10 MG tablet Take 1 tablet (10 mg total) by mouth 3 (three) times daily as needed for anxiety. 09/18/19   Hubbard Hartshorn, FNP  pantoprazole (PROTONIX) 40 MG tablet Take 1 tablet (40 mg total) by mouth 2 (two) times daily before a meal. 08/18/18 08/19/19  Vanga, Tally Due, MD  predniSONE (DELTASONE) 10 MG tablet Take 6 tabs today and decrease by 1 tablet for the following 5 days 08/01/20   Danton Clap, PA-C  promethazine (PHENERGAN) 25 MG tablet Take 1  tablet (25 mg total) by mouth every 6 (six) hours as needed for nausea or vomiting. 12/29/19   Katy Apo, NP  famotidine (PEPCID) 20 MG tablet Take 1 tablet (20 mg total) by mouth 2 (two) times daily. 03/25/18 08/19/19  Carrie Mew, MD    Family History Family History  Problem Relation Age of Onset  . Diabetes Mother   . Hypertension Mother   . Other Mother        Covid death  . Diabetes Father   . Hypertension Father   . Stroke Father   . Heart attack Father     Social History Social History   Tobacco Use  . Smoking status: Former Smoker    Packs/day: 0.50    Years: 7.00    Pack years: 3.50    Types: Cigarettes    Quit date: 10/19/2015    Years since quitting: 4.7  . Smokeless tobacco:  Never Used  Vaping Use  . Vaping Use: Never used  Substance Use Topics  . Alcohol use: Yes    Comment: socially - 1x/mo  . Drug use: No     Allergies   Aspirin and Adhesive [tape]   Review of Systems Review of Systems  Constitutional: Negative for fatigue and fever.  Gastrointestinal: Negative for abdominal pain.  Genitourinary: Negative for dysuria.  Musculoskeletal: Positive for back pain and gait problem. Negative for arthralgias and joint swelling.  Skin: Negative for color change, rash and wound.  Neurological: Positive for numbness. Negative for weakness.     Physical Exam Triage Vital Signs ED Triage Vitals  Enc Vitals Group     BP 08/01/20 1603 (!) 127/96     Pulse Rate 08/01/20 1603 97     Resp 08/01/20 1603 14     Temp 08/01/20 1603 98.8 F (37.1 C)     Temp Source 08/01/20 1603 Oral     SpO2 08/01/20 1603 98 %     Weight 08/01/20 1601 135 lb (61.2 kg)     Height 08/01/20 1601 5\' 9"  (1.753 m)     Head Circumference --      Peak Flow --      Pain Score 08/01/20 1601 7     Pain Loc --      Pain Edu? --      Excl. in [place]? --    No data found.  Updated Vital Signs BP (!) 127/96 (BP Location: Left Arm)   Pulse 97   Temp 98.8 F (37.1 C) (Oral)    Resp 14   Ht 5\' 9"  (1.753 m)   Wt 135 lb (61.2 kg)   LMP  (LMP Unknown)   SpO2 98%   BMI 19.94 kg/m        Physical Exam Vitals and nursing note reviewed.  Constitutional:      General: She is in acute distress (moderate distress due to pain, appears very uncomfortable, leaning to right side in chair).     Appearance: Normal appearance. She is not ill-appearing or toxic-appearing.  HENT:     Head: Normocephalic and atraumatic.     Nose: Nose normal.     Mouth/Throat:     Mouth: Mucous membranes are moist.     Pharynx: Oropharynx is clear.  Eyes:     General: No scleral icterus.       Right eye: No discharge.        Left eye: No discharge.     Conjunctiva/sclera: Conjunctivae normal.  Cardiovascular:     Rate and Rhythm: Normal rate and regular rhythm.     Heart sounds: Normal heart sounds.  Pulmonary:     Effort: Pulmonary effort is normal. No respiratory distress.     Breath sounds: Normal breath sounds.  Musculoskeletal:     Cervical back: Neck supple.     Lumbar back: Spasms (right paralumbar swelling and spasms diffusely) and tenderness (right paralumbar (lower) regions) present. No bony tenderness. Decreased range of motion. Positive right straight leg raise test. Negative left straight leg raise test.  Skin:    General: Skin is dry.  Neurological:     General: No focal deficit present.     Mental Status: She is alert. Mental status is at baseline.     Motor: No weakness.     Gait: Gait normal.  Psychiatric:        Mood and Affect: Mood normal.  Behavior: Behavior normal.        Thought Content: Thought content normal.      UC Treatments / Results  Labs (all labs ordered are listed, but only abnormal results are displayed) Labs Reviewed - No data to display  EKG   Radiology DG Lumbar Spine Complete  Result Date: 08/01/2020 CLINICAL DATA:  Lower back pain after lifting injury. EXAM: LUMBAR SPINE - COMPLETE 4+ VIEW COMPARISON:  None. FINDINGS:  Status post surgical posterior fusion of L5-S1 with bilateral intrapedicular screw placement interbody fusion. No fracture or spondylolisthesis is noted. Minimal degenerative changes are noted at L1-2 with anterior osteophyte formation. IMPRESSION: Postsurgical changes as described above. No acute abnormality seen in the lumbar spine. Electronically Signed   By: Marijo Conception M.D.   On: 08/01/2020 16:54    Procedures Procedures (including critical care time)  Medications Ordered in UC Medications  ketorolac (TORADOL) injection 60 mg (60 mg Intramuscular Given 08/01/20 1637)    Initial Impression / Assessment and Plan / UC Course  I have reviewed the triage vital signs and the nursing notes.  Pertinent labs & imaging results that were available during my care of the patient were reviewed by me and considered in my medical decision making (see chart for details).   Imaging of L-spine shows postsurgical hardware in place.  No acute changes.  Discussed results with patient.  Advised her she may likely have a bulging herniated disc.  Treating at this time with prednisone, cyclobenzaprine, and Norco as needed for pain.  Treating for lumbar radiculopathy and lumbar strain.  Advise heat, muscle rubs and stretches.  Advised against bedrest more than 2 days.  Work note provided.  Advised patient she should feel better in the next 4 to 6 weeks, but if she does not or she has any worsening of symptoms she should go to Ortho for another MRI if necessary.  ED precautions discussed for back pain.  Patient understanding and agreeable.  Final Clinical Impressions(s) / UC Diagnoses   Final diagnoses:  Acute right-sided low back pain with right-sided sciatica  Strain of lumbar region, initial encounter     Discharge Instructions     BACK PAIN: Stressed avoiding painful activities . RICE (REST, ICE, COMPRESSION, ELEVATION) guidelines reviewed. May alternate ice and heat. Consider use of muscle rubs,  Salonpas patches, etc. Use medications as directed including muscle relaxers if prescribed. Take anti-inflammatory medications as prescribed or OTC NSAIDs/Tylenol.  F/u with PCP in 7-10 days for reexamination, and please feel free to call or return to the urgent care at any time for any questions or concerns you may have and we will be happy to help you!   BACK PAIN RED FLAGS: If the back pain acutely worsens or there are any red flag symptoms such as numbness/tingling, leg weakness, saddle anesthesia, or loss of bowel/bladder control, go immediately to the ER. Follow up with [place] as scheduled or sooner if the pain does not begin to resolve or if it worsens before the follow up      ED Prescriptions    Medication Sig Dispense Auth. Provider   predniSONE (DELTASONE) 10 MG tablet Take 6 tabs today and decrease by 1 tablet for the following 5 days 21 tablet Laurene Footman B, PA-C   cyclobenzaprine (FLEXERIL) 10 MG tablet Take 1 tablet (10 mg total) by mouth 3 (three) times daily as needed for up to 10 days for muscle spasms. 30 tablet Danton Clap, PA-C   HYDROcodone-acetaminophen (  NORCO/VICODIN) 5-325 MG tablet Take 1 tablet by mouth every 6 (six) hours as needed for up to 5 days. 15 tablet Danton Clap, PA-C     I have reviewed the PDMP during this encounter.   Danton Clap, PA-C 08/01/20 1722

## 2020-08-01 NOTE — ED Triage Notes (Signed)
Patient states that she had to left her father who suffers from dementia out of a ditch early this morning.  Patient c/o lower back pain that started after she did the lifting.

## 2020-08-01 NOTE — Discharge Instructions (Signed)

## 2020-09-24 ENCOUNTER — Telehealth: Payer: BLUE CROSS/BLUE SHIELD | Admitting: Family

## 2020-09-24 DIAGNOSIS — B029 Zoster without complications: Secondary | ICD-10-CM | POA: Diagnosis not present

## 2020-09-24 MED ORDER — GABAPENTIN 300 MG PO CAPS
300.0000 mg | ORAL_CAPSULE | Freq: Two times a day (BID) | ORAL | 0 refills | Status: DC
Start: 2020-09-24 — End: 2020-11-14

## 2020-09-24 MED ORDER — VALACYCLOVIR HCL 1 G PO TABS
1000.0000 mg | ORAL_TABLET | Freq: Three times a day (TID) | ORAL | 0 refills | Status: DC
Start: 2020-09-24 — End: 2021-03-25

## 2020-09-24 NOTE — Progress Notes (Signed)
E-visit for Shingles   We are sorry that you are not feeling well. Here is how we plan to help!  Based on what you shared with me it looks like you have shingles.  Shingles or herpes zoster, is a common infection of the nerves.  It is a painful rash caused by the herpes zoster virus.  This is the same virus that causes chickenpox.  After a person has chickenpox, the virus remains inactive in the nerve cells.  Years later, the virus can become active again and travel to the skin.  It typically will appear on one side of the face or body.  Burning or shooting pain, tingling, or itching are early signs of the infection.  Blisters typically scab over in 7 to 10 days and clear up within 2-4 weeks. Shingles is only contagious to people that have never had the chickenpox, the chickenpox vaccine, or anyone who has a compromised immune system.  You should avoid contact with these type of people until your blisters scab over.  I have prescribed Valacyclovir 1g three times daily for 7 days and also Gabapentin 300mg twice daily as needed for pain   HOME CARE: Apply ice packs (wrapped in a thin towel), cool compresses, or soak in cool bath to help reduce pain. Use calamine lotion to calm itchy skin. Avoid scratching the rash. Avoid direct sunlight.  GET HELP RIGHT AWAY IF: Symptoms that don't away after treatment. A rash or blisters near your eye. Increased drainage, fever, or rash after treatment. Severe pain that doesn't go away.   MAKE SURE YOU   Understand these instructions. Will watch your condition. Will get help right away if you are not doing well or get worse.  Thank you for choosing an e-visit.  Your e-visit answers were reviewed by a board certified advanced clinical practitioner to complete your personal care plan. Depending upon the condition, your plan could have included both over the counter or prescription medications.  Please review your pharmacy choice. Make sure the pharmacy  is open so you can pick up prescription now. If there is a problem, you may contact your provider through MyChart messaging and have the prescription routed to another pharmacy.  Your safety is important to us. If you have drug allergies check your prescription carefully.   For the next 24 hours you can use MyChart to ask questions about today's visit, request a non-urgent call back, or ask for a work or school excuse. You will get an email in the next two days asking about your experience. I hope that your e-visit has been valuable and will speed your recovery.  Approximately 5 minutes was spent documenting and reviewing patient's chart.   

## 2020-11-12 ENCOUNTER — Ambulatory Visit
Admission: EM | Admit: 2020-11-12 | Discharge: 2020-11-12 | Disposition: A | Discharge status: Home / Self Care | Payer: BLUE CROSS/BLUE SHIELD | Attending: Physician Assistant | Admitting: Physician Assistant

## 2020-11-12 ENCOUNTER — Ambulatory Visit (INDEPENDENT_AMBULATORY_CARE_PROVIDER_SITE_OTHER): Payer: BLUE CROSS/BLUE SHIELD

## 2020-11-12 ENCOUNTER — Other Ambulatory Visit: Payer: Self-pay

## 2020-11-12 ENCOUNTER — Encounter: Payer: Self-pay | Admitting: Emergency Medicine

## 2020-11-12 DIAGNOSIS — R519 Headache, unspecified: Secondary | ICD-10-CM

## 2020-11-12 DIAGNOSIS — M546 Pain in thoracic spine: Secondary | ICD-10-CM

## 2020-11-12 DIAGNOSIS — S161XXA Strain of muscle, fascia and tendon at neck level, initial encounter: Secondary | ICD-10-CM

## 2020-11-12 DIAGNOSIS — M542 Cervicalgia: Secondary | ICD-10-CM

## 2020-11-12 MED ORDER — NAPROXEN 500 MG PO TABS
500.0000 mg | ORAL_TABLET | Freq: Two times a day (BID) | ORAL | 0 refills | Status: DC
Start: 2020-11-12 — End: 2020-11-14

## 2020-11-12 MED ORDER — CYCLOBENZAPRINE HCL 10 MG PO TABS
10.0000 mg | ORAL_TABLET | Freq: Three times a day (TID) | ORAL | 0 refills | Status: DC | PRN
Start: 2020-11-12 — End: 2020-11-14

## 2020-11-12 MED ORDER — KETOROLAC TROMETHAMINE 60 MG/2ML IM SOLN
60.0000 mg | Freq: Once | INTRAMUSCULAR | Status: AC
  Administered 2020-11-12: 60 mg via INTRAMUSCULAR
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
## 2020-11-14 ENCOUNTER — Encounter: Payer: Self-pay | Admitting: Emergency Medicine

## 2020-11-14 ENCOUNTER — Ambulatory Visit
Admission: EM | Admit: 2020-11-14 | Discharge: 2020-11-14 | Disposition: A | Discharge status: Home / Self Care | Payer: BLUE CROSS/BLUE SHIELD | Attending: Emergency Medicine | Admitting: Emergency Medicine

## 2020-11-14 ENCOUNTER — Other Ambulatory Visit: Payer: Self-pay

## 2020-11-14 DIAGNOSIS — S161XXD Strain of muscle, fascia and tendon at neck level, subsequent encounter: Secondary | ICD-10-CM

## 2020-11-14 MED ORDER — PREDNISONE 10 MG (21) PO TBPK: ORAL_TABLET | Freq: Every day | ORAL | 0 refills | Status: DC

## 2020-11-14 MED ORDER — DIAZEPAM 5 MG PO TABS: 5.0000 mg | ORAL_TABLET | Freq: Every evening | ORAL | 0 refills | Status: AC

## 2020-11-14 MED ORDER — TIZANIDINE HCL 4 MG PO TABS
4.0000 mg | ORAL_TABLET | Freq: Four times a day (QID) | ORAL | 0 refills | Status: DC | PRN
Start: 2020-11-14 — End: 2021-03-25

## 2020-11-14 NOTE — Discharge Instructions (Addendum)
Stop the naproxen and the Flexeril.  Start taking the prednisone according to the package directions.  Use the tizanidine for muscle spasm. You can take it 4 times a day as needed.  Take the diazepam tablet nightly at bedtime. Do not take this with the tizanidine.  Continue moist heat and neck range of motion exercises.  If your symptoms do not improve you will need to follow-up with your spine specialist at [facility] clinic.
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
## 2021-02-22 ENCOUNTER — Ambulatory Visit
Admission: EM | Admit: 2021-02-22 | Discharge: 2021-02-22 | Disposition: A | Discharge status: Home / Self Care | Payer: 59

## 2021-02-22 DIAGNOSIS — R519 Headache, unspecified: Secondary | ICD-10-CM

## 2021-02-22 DIAGNOSIS — K920 Hematemesis: Secondary | ICD-10-CM | POA: Diagnosis not present

## 2021-02-22 NOTE — ED Triage Notes (Signed)
Pt sts she has had several episodes of emesis this morning.sts she is unsure if it was something that she ate last night to cause her to feel sick this morning.  Also reports having a headache.

## 2021-02-22 NOTE — ED Provider Notes (Signed)
[facility]    CSN: [id number] Arrival date & time: 02/22/21  1137      History   Chief Complaint Chief Complaint  Patient presents with  . Emesis    HPI Linda Rhodes is a 45 y.o. female.   HPI   Emesis: Pt reports that she has had 10 episodes of emesis this morning. Now vomiting blood. She is also having a headache and has a history of migraines. This headache is different than her normal migraines and is severe 10/10 in nature. No fevers, abdominal pain, neck stiffness, visual changes. She has tried gingerale for symptoms without relief.   Past Medical History:  Diagnosis Date  . Abscessed tooth 09/24/2016   right lower tooth removed.  . Anemia    in past, prior to removal of uterine fibroids  . Bladder infection   . Difficult intubation   . GERD (gastroesophageal reflux disease)   . Headache    migrains  . Hypertension   . Motion sickness    all moving vehicles  . Ovarian cyst   . PONV (postoperative nausea and vomiting)     Patient Active Problem List   Diagnosis Date Noted  . Grief reaction 08/20/2019  . Essential hypertension 08/20/2019  . 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 Herpes zoster without complication [id number]  . Elevated blood pressure reading 07/18/2018  . Radiculopathy of lumbar region 12/15/2017  . Status post lumbar laminectomy 12/15/2017  . Intractable chronic migraine without aura and without status migrainosus 08/04/2016  . S/P hysterectomy 05/06/2015    Past Surgical History:  Procedure Laterality Date  . ABDOMINAL HYSTERECTOMY    . BACK SURGERY  2016   [facility] - ruptured L4-5  . CESAREAN SECTION    . CHOLECYSTECTOMY    . CYSTOSCOPY N/A 05/06/2015   Procedure: CYSTOSCOPY;  Surgeon: Aletha Halim, MD;  Location: [facility];  Service: Gynecology;  Laterality: N/A;  . ESOPHAGOGASTRODUODENOSCOPY (EGD) WITH PROPOFOL N/A 04/13/2018   Procedure: ESOPHAGOGASTRODUODENOSCOPY  (EGD) WITH PROPOFOL;  Surgeon: Lin Landsman, MD;  Location: [facility] ENDOSCOPY;  Service: Gastroenterology;  Laterality: N/A;  . ETHMOIDECTOMY Right 11/25/2015   Procedure:  ANTERIOR ETHMOIDECTOMY;  Surgeon: Carloyn Manner, MD;  Location: [place];  Service: ENT;  Laterality: Right;  . FRONTAL SINUS EXPLORATION Right 11/25/2015   Procedure: FRONTAL SINUSOTOMY;  Surgeon: Carloyn Manner, MD;  Location: [place];  Service: ENT;  Laterality: Right;  . IMAGE GUIDED SINUS SURGERY N/A 11/25/2015   Procedure: IMAGE GUIDED SINUS SURGERY WITH BALLOON;  Surgeon: Carloyn Manner, MD;  Location: [place];  Service: ENT;  Laterality: N/A;  . LAPAROSCOPIC HYSTERECTOMY N/A 05/06/2015   Procedure: HYSTERECTOMY TOTAL LAPAROSCOPIC;  Surgeon: Aletha Halim, MD;  Location: [facility];  Service: Gynecology;  Laterality: N/A;  . LUMBAR LAMINECTOMY/DECOMPRESSION MICRODISCECTOMY N/A 10/04/2016   Procedure: right minimally invasive L5 S1 microdiscectomy;  Surgeon: Bayard Hugger, MD;  Location: [facility];  Service: Neurosurgery;  Laterality: N/A;  . MAXILLARY ANTROSTOMY Right 11/25/2015   Procedure: MAXILLARY ANTROSTOMY WITH TISSUE REMOVAL;  Surgeon: Carloyn Manner, MD;  Location: [place];  Service: ENT;  Laterality: Right;  . WISDOM TOOTH EXTRACTION  10/2115   3 teeth removed.    OB History   No obstetric history on file.      Home Medications    Prior to Admission medications   Medication Sig Start Date End Date Taking? Authorizing Provider  amLODipine (NORVASC) 10 MG tablet Take 1  tablet (10 mg total) by mouth daily. 09/18/19   Hubbard Hartshorn, FNP  chlorthalidone (HYGROTON) 25 MG tablet Take 1 tablet (25 mg total) by mouth daily. 04/12/20   Melynda Ripple, MD  ibuprofen (ADVIL) 600 MG tablet Take 1 tablet (600 mg total) by mouth every 6 (six) hours as needed. 04/12/20   Melynda Ripple, MD  metoprolol succinate (TOPROL-XL) 50 MG 24 hr tablet Take 1 tablet (50 mg  total) by mouth at bedtime. Take with or immediately following a meal. 04/12/20   Melynda Ripple, MD  pantoprazole (PROTONIX) 40 MG tablet Take 1 tablet (40 mg total) by mouth 2 (two) times daily before a meal. 08/18/18 08/19/19  Vanga, Tally Due, MD  predniSONE (STERAPRED UNI-PAK 21 TAB) 10 MG (21) TBPK tablet Take by mouth daily. Take 6 tabs by mouth daily  for 2 days, then 5 tabs for 2 days, then 4 tabs for 2 days, then 3 tabs for 2 days, 2 tabs for 2 days, then 1 tab by mouth daily for 2 days 11/14/20   Margarette Canada, NP  spironolactone (ALDACTONE) 100 MG tablet Take 1 tablet (100 mg total) by mouth 3 (three) times daily. 04/12/20   Melynda Ripple, MD  tiZANidine (ZANAFLEX) 4 MG tablet Take 1 tablet (4 mg total) by mouth every 6 (six) hours as needed for muscle spasms. 11/14/20   Margarette Canada, NP  valACYclovir (VALTREX) 1000 MG tablet Take 1 tablet (1,000 mg total) by mouth 3 (three) times daily. 09/24/20   Sharion Balloon, FNP  escitalopram (LEXAPRO) 20 MG tablet TAKE 1 TABLET BY MOUTH EVERY DAY 12/13/19 11/14/20  Hubbard Hartshorn, FNP  famotidine (PEPCID) 20 MG tablet Take 1 tablet (20 mg total) by mouth 2 (two) times daily. 03/25/18 08/19/19  Carrie Mew, MD  gabapentin (NEURONTIN) 300 MG capsule Take 1 capsule (300 mg total) by mouth 2 (two) times daily. 09/24/20 11/14/20  Sharion Balloon, FNP  promethazine (PHENERGAN) 25 MG tablet Take 1 tablet (25 mg total) by mouth every 6 (six) hours as needed for nausea or vomiting. 12/29/19 11/14/20  Katy Apo, NP    Family History Family History  Problem Relation Age of Onset  . Diabetes Mother   . Hypertension Mother   . Other Mother        Covid death  . Diabetes Father   . Hypertension Father   . Stroke Father   . Heart attack Father     Social History Social History   Tobacco Use  . Smoking status: Former Smoker    Packs/day: 0.50    Years: 7.00    Pack years: 3.50    Types: Cigarettes    Quit date: 10/19/2015    Years since  quitting: 5.3  . Smokeless tobacco: Never Used  Vaping Use  . Vaping Use: Never used  Substance Use Topics  . Alcohol use: Yes    Comment: socially - 1x/mo  . Drug use: No     Allergies   Aspirin and Adhesive [tape]   Review of Systems Review of Systems  As stated in HPI Physical Exam Triage Vital Signs ED Triage Vitals  Enc Vitals Group     BP 02/22/21 1231 (!) 166/113     Pulse Rate 02/22/21 1231 90     Resp 02/22/21 1231 16     Temp 02/22/21 1231 97.8 F (36.6 C)     Temp Source 02/22/21 1231 Oral     SpO2 02/22/21 1231 100 %  Weight 02/22/21 1227 140 lb (63.5 kg)     Height 02/22/21 1227 5\' 9"  (1.753 m)     Head Circumference --      Peak Flow --      Pain Score 02/22/21 1227 10     Pain Loc --      Pain Edu? --      Excl. in [place]? --    No data found.  Updated Vital Signs BP (!) 166/113   Pulse 90   Temp 97.8 F (36.6 C) (Oral)   Resp 16   Ht 5\' 9"  (1.753 m)   Wt 140 lb (63.5 kg)   LMP  (LMP Unknown)   SpO2 100%   BMI 20.67 kg/m    Physical Exam Vitals and nursing note reviewed.  Constitutional:      General: She is in acute distress.     Appearance: She is ill-appearing and toxic-appearing.  HENT:     Head: Normocephalic.     Right Ear: Tympanic membrane, ear canal and external ear normal.     Left Ear: Tympanic membrane, ear canal and external ear normal.     Mouth/Throat:     Comments: dry Eyes:     Extraocular Movements: Extraocular movements intact.     Pupils: Pupils are equal, round, and reactive to light.     Comments: No pallor. Mild possible proptosis bilaterally  Neck:     Vascular: No carotid bruit.  Cardiovascular:     Rate and Rhythm: Normal rate and regular rhythm.     Pulses: Normal pulses.     Heart sounds: Normal heart sounds.  Pulmonary:     Effort: Pulmonary effort is normal.     Breath sounds: Normal breath sounds.  Abdominal:     General: Bowel sounds are normal.     Palpations: Abdomen is soft.      Tenderness: There is abdominal tenderness. There is guarding.  Musculoskeletal:     Cervical back: Normal range of motion and neck supple. No rigidity.  Skin:    General: Skin is warm.     Coloration: Skin is not jaundiced or pale.  Neurological:     General: No focal deficit present.     Mental Status: She is alert and oriented to person, place, and time.     Cranial Nerves: No cranial nerve deficit.     Sensory: No sensory deficit.     Motor: No weakness.     Coordination: Coordination normal.     Gait: Gait normal.     Deep Tendon Reflexes: Reflexes normal.  Psychiatric:        Mood and Affect: Mood normal.        Behavior: Behavior normal.      UC Treatments / Results  Labs (all labs ordered are listed, but only abnormal results are displayed) Labs Reviewed - No data to display  EKG   Radiology No results found.  Procedures Procedures (including critical care time)  Medications Ordered in UC Medications - No data to display  Initial Impression / Assessment and Plan / UC Course  I have reviewed the triage vital signs and the nursing notes.  Pertinent labs & imaging results that were available during my care of the patient were reviewed by me and considered in my medical decision making (see chart for details).     New. Discussed my concerns with patient regarding her severe headache with intractable vomiting with hematemesis. I have recommended EMS transportation to the  ED and discussed why. Pt jumped up from examination chair stating that she "would not go" to the ER. I tried multiple times to discuss my concerns for potential morbidity and mortality without emergency evaluation. She gathered her items and left AMA.  Final Clinical Impressions(s) / UC Diagnoses   Final diagnoses:  None   Discharge Instructions   None    ED Prescriptions    None     PDMP not reviewed this encounter.   Hughie Closs, [place] 02/22/21 1311

## 2021-03-24 ENCOUNTER — Ambulatory Visit: Payer: Self-pay

## 2021-03-24 ENCOUNTER — Encounter: Payer: Self-pay | Admitting: Obstetrics and Gynecology

## 2021-03-24 NOTE — Telephone Encounter (Signed)
Pt being seen today 6/7 at gyn

## 2021-03-24 NOTE — Telephone Encounter (Signed)
Pt noted and small lump to the right breast four months ago. Pt stated now the lump is bigger than a grape and is hard.  Lump located right upper quadrant of right breast. Pt stated the lump becomes sore when she uses her right arm. Agent make appt with PCP in late June.  Pt goes to [place]. Advised pt that her GYN can see her for this issue also and would she like to speak to that office. Advised pt that GYN can order mammogram.  Advised pt to call back and cancel appt  With Dr. Ancil Boozer if she gets scheduled sooner. Pt verbalized understanding.   Reason for Disposition . Breast lump  Answer Assessment - Initial Assessment Questions 1. SYMPTOM: "What's the main symptom you're concerned about?"  (e.g., lump, pain, rash, nipple discharge)     lump 2. LOCATION: "Where is the lump located?"   Right breast - right upper breast 3. ONSET: "When did lump  start?"     4 months 4. PRIOR HISTORY: "Do you have any history of prior problems with your breasts?" (e.g., lumps, cancer, fibrocystic breast disease)     no 5. CAUSE: "What do you think is causing this symptom?"     Hard to touch- bigger than grape 6. OTHER SYMPTOMS: "Do you have any other symptoms?" (e.g., fever, breast pain, redness or rash, nipple discharge)     Soreness only especially when uses arm - was not initially not painful but now is sore especially when using right arm  Protocols used: BREAST [facility]

## 2021-03-25 ENCOUNTER — Ambulatory Visit (INDEPENDENT_AMBULATORY_CARE_PROVIDER_SITE_OTHER): Payer: 59 | Admitting: Obstetrics and Gynecology

## 2021-03-25 ENCOUNTER — Other Ambulatory Visit: Payer: Self-pay

## 2021-03-25 ENCOUNTER — Encounter: Payer: Self-pay | Admitting: Obstetrics and Gynecology

## 2021-03-25 VITALS — BP 124/90 | Ht 67.0 in | Wt 140.0 lb

## 2021-03-25 DIAGNOSIS — Z1231 Encounter for screening mammogram for malignant neoplasm of breast: Secondary | ICD-10-CM | POA: Diagnosis not present

## 2021-03-25 DIAGNOSIS — Z803 Family history of malignant neoplasm of breast: Secondary | ICD-10-CM | POA: Diagnosis not present

## 2021-03-25 DIAGNOSIS — N6310 Unspecified lump in the right breast, unspecified quadrant: Secondary | ICD-10-CM | POA: Diagnosis not present

## 2021-03-25 NOTE — Patient Instructions (Signed)
I value your feedback and you entrusting us with your care. If you get a [place] patient survey, I would appreciate you taking the time to let us know about your experience today. Thank you! ? ? ?

## 2021-03-25 NOTE — Progress Notes (Addendum)
Hubbard Hartshorn, FNP   Chief Complaint  Patient presents with  . Breast Exam    Lump in RB, tender/painful x 3-4 months    HPI:      Linda Rhodes is a 45 y.o. G0P0000 whose LMP was No LMP recorded (lmp unknown). Patient has had a hysterectomy., presents today for NP eval of painful RT breast mass for a few months. Pain and size haven't changed since first noticed on SBE; sometimes can't feel mass but pain persists. No erythema, prior trauma, nipple d/c. S/p TAH but still has ovaries. Pt states she's had a mammogram in the past at [facility], but no imaging results in [facility]. Pt drinks a lot of caffeine. FH breast cancer in her mat aunt and pat aunt, genetic testing not done and pt qualifies with pat aunt.  Past Medical History:  Diagnosis Date  . Abscessed tooth 09/24/2016   right lower tooth removed.  . Anemia    in past, prior to removal of uterine fibroids  . Bladder infection   . Difficult intubation   . GERD (gastroesophageal reflux disease)   . Headache    migrains  . Hypertension   . Motion sickness    all moving vehicles  . Ovarian cyst   . PONV (postoperative nausea and vomiting)     Past Surgical History:  Procedure Laterality Date  . ABDOMINAL HYSTERECTOMY    . BACK SURGERY  2016   [facility] - ruptured L4-5  . CESAREAN SECTION    . CHOLECYSTECTOMY    . CYSTOSCOPY N/A 05/06/2015   Procedure: CYSTOSCOPY;  Surgeon: Aletha Halim, MD;  Location: [facility];  Service: Gynecology;  Laterality: N/A;  . ESOPHAGOGASTRODUODENOSCOPY (EGD) WITH PROPOFOL N/A 04/13/2018   Procedure: ESOPHAGOGASTRODUODENOSCOPY (EGD) WITH PROPOFOL;  Surgeon: Lin Landsman, MD;  Location: [facility] ENDOSCOPY;  Service: Gastroenterology;  Laterality: N/A;  . ETHMOIDECTOMY Right 11/25/2015   Procedure:  ANTERIOR ETHMOIDECTOMY;  Surgeon: Carloyn Manner, MD;  Location: [place];  Service: ENT;  Laterality: Right;  . FRONTAL SINUS EXPLORATION Right  11/25/2015   Procedure: FRONTAL SINUSOTOMY;  Surgeon: Carloyn Manner, MD;  Location: [place];  Service: ENT;  Laterality: Right;  . IMAGE GUIDED SINUS SURGERY N/A 11/25/2015   Procedure: IMAGE GUIDED SINUS SURGERY WITH BALLOON;  Surgeon: Carloyn Manner, MD;  Location: [place];  Service: ENT;  Laterality: N/A;  . LAPAROSCOPIC HYSTERECTOMY N/A 05/06/2015   Procedure: HYSTERECTOMY TOTAL LAPAROSCOPIC;  Surgeon: Aletha Halim, MD;  Location: [facility];  Service: Gynecology;  Laterality: N/A;  . LUMBAR LAMINECTOMY/DECOMPRESSION MICRODISCECTOMY N/A 10/04/2016   Procedure: right minimally invasive L5 S1 microdiscectomy;  Surgeon: Bayard Hugger, MD;  Location: [facility];  Service: Neurosurgery;  Laterality: N/A;  . MAXILLARY ANTROSTOMY Right 11/25/2015   Procedure: MAXILLARY ANTROSTOMY WITH TISSUE REMOVAL;  Surgeon: Carloyn Manner, MD;  Location: [place];  Service: ENT;  Laterality: Right;  . WISDOM TOOTH EXTRACTION  10/2115   3 teeth removed.    Family History  Problem Relation Age of Onset  . Diabetes Mother   . Hypertension Mother   . Other Mother        Covid death  . Heart disease Mother   . Kidney disease Mother        stage 4 renal failure  . Diabetes Father   . Hypertension Father   . Stroke Father   . Heart attack Father   . Colon cancer Paternal Grandmother 34  .  Breast cancer Maternal Aunt 56  . Breast cancer Paternal Aunt 48    Social History   Socioeconomic History  . Marital status: Single    Spouse name: Not on file  . Number of children: 1  . Years of education: Not on file  . Highest education level: Associate degree: occupational, Hotel manager, or vocational program  Occupational History  . Not on file  Tobacco Use  . Smoking status: Former Smoker    Packs/day: 0.50    Years: 7.00    Pack years: 3.50    Types: Cigarettes    Quit date: 10/19/2015    Years since quitting: 5.4  . Smokeless tobacco: Never Used  Vaping Use  . Vaping  Use: Never used  Substance and Sexual Activity  . Alcohol use: Yes    Comment: socially - 1x/mo  . Drug use: No  . Sexual activity: Yes    Partners: Female    Birth control/protection: Surgical    Comment: Hysterectomy  Other Topics Concern  . Not on file  Social History Narrative  . Not on file   Social Determinants of Health   Financial Resource Strain: Not on file  Food Insecurity: Not on file  Transportation Needs: Not on file  Physical Activity: Not on file  Stress: Not on file  Social Connections: Not on file  Intimate Partner Violence: Not on file    Outpatient Medications Prior to Visit  Medication Sig Dispense Refill  . amLODipine (NORVASC) 10 MG tablet Take 1 tablet (10 mg total) by mouth daily. 90 tablet 3  . cyclobenzaprine (FLEXERIL) 10 MG tablet Take 1 tablet by mouth at bedtime.    . gabapentin (NEURONTIN) 300 MG capsule Take 1 capsule by mouth at bedtime.    . metoprolol succinate (TOPROL-XL) 50 MG 24 hr tablet Take 1 tablet (50 mg total) by mouth at bedtime. Take with or immediately following a meal. 30 tablet 1  . oxyCODONE (OXY IR/ROXICODONE) 5 MG immediate release tablet Take by mouth.    . spironolactone (ALDACTONE) 100 MG tablet Take 1 tablet (100 mg total) by mouth 3 (three) times daily. 30 tablet 1  . varenicline (CHANTIX PAK) 0.5 MG X 11 & 1 MG X 42 tablet See admin instructions.    . chlorthalidone (HYGROTON) 25 MG tablet Take 1 tablet (25 mg total) by mouth daily. 30 tablet 1  . ibuprofen (ADVIL) 600 MG tablet Take 1 tablet (600 mg total) by mouth every 6 (six) hours as needed. 30 tablet 0  . pantoprazole (PROTONIX) 40 MG tablet Take 1 tablet (40 mg total) by mouth 2 (two) times daily before a meal. 180 tablet 0  . predniSONE (STERAPRED UNI-PAK 21 TAB) 10 MG (21) TBPK tablet Take by mouth daily. Take 6 tabs by mouth daily  for 2 days, then 5 tabs for 2 days, then 4 tabs for 2 days, then 3 tabs for 2 days, 2 tabs for 2 days, then 1 tab by mouth daily for  2 days 42 tablet 0  . tiZANidine (ZANAFLEX) 4 MG tablet Take 1 tablet (4 mg total) by mouth every 6 (six) hours as needed for muscle spasms. 30 tablet 0  . valACYclovir (VALTREX) 1000 MG tablet Take 1 tablet (1,000 mg total) by mouth 3 (three) times daily. 21 tablet 0   No facility-administered medications prior to visit.      ROS:  Review of Systems  Constitutional: Negative for fever.  Gastrointestinal: Negative for blood in stool, constipation, diarrhea,  nausea and vomiting.  Genitourinary: Negative for dyspareunia, dysuria, flank pain, frequency, hematuria, urgency, vaginal bleeding, vaginal discharge and vaginal pain.  Musculoskeletal: Negative for back pain.  Skin: Negative for rash.   BREAST: pain/ mass  OBJECTIVE:   Vitals:  BP 124/90   Ht 5\' 7"  (1.702 m)   Wt 140 lb (63.5 kg)   LMP  (LMP Unknown)   BMI 21.93 kg/m   Physical Exam Vitals reviewed.  Pulmonary:     Effort: Pulmonary effort is normal.  Chest:  Breasts: Breasts are symmetrical.     Right: No inverted nipple, mass, nipple discharge, skin change or tenderness.     Left: No inverted nipple, mass, nipple discharge, skin change or tenderness.     Musculoskeletal:        General: Normal range of motion.     Cervical back: Normal range of motion.  Skin:    General: Skin is warm and dry.  Neurological:     General: No focal deficit present.     Mental Status: She is alert and oriented to person, place, and time.     Cranial Nerves: No cranial nerve deficit.  Psychiatric:        Mood and Affect: Mood normal.        Behavior: Behavior normal.        Thought Content: Thought content normal.        Judgment: Judgment normal.     Assessment/Plan: Breast mass, right - Plan: MM DIAG BREAST TOMO BILATERAL, US BREAST LTD UNI LEFT INC AXILLA, US BREAST LTD UNI RIGHT INC AXILLA; 10:00 position, tender on exam. Check Dx mammo and u/s. Will f/u with results. If neg, d/c caffeine to see if sx improve.    Encounter for screening mammogram for malignant neoplasm of breast - Plan: MM DIAG BREAST TOMO BILATERAL, US BREAST LTD UNI LEFT INC AXILLA, US BREAST LTD UNI RIGHT INC AXILLA  Family history of breast cancer--MyRisk testing discussed and pt not interested today.     Return if symptoms worsen or fail to improve.  Areg Bialas B. Constance Whittle, PA-C 03/25/2021 9:13 AM

## 2021-03-30 ENCOUNTER — Ambulatory Visit
Admission: RE | Admit: 2021-03-30 | Discharge: 2021-03-30 | Disposition: A | Discharge status: Home / Self Care | Payer: 59 | Source: Ambulatory Visit | Attending: Obstetrics and Gynecology | Admitting: Obstetrics and Gynecology

## 2021-03-30 ENCOUNTER — Other Ambulatory Visit: Payer: Self-pay

## 2021-03-30 DIAGNOSIS — Z1231 Encounter for screening mammogram for malignant neoplasm of breast: Secondary | ICD-10-CM

## 2021-03-30 DIAGNOSIS — N6310 Unspecified lump in the right breast, unspecified quadrant: Secondary | ICD-10-CM | POA: Diagnosis not present

## 2021-04-10 NOTE — Progress Notes (Deleted)
Name: Linda Rhodes   MRN: [id number]    DOB: 06-28-1976   Date:04/10/2021       Progress Note  Subjective  Chief Complaint  Knot on breast  HPI  ***  Patient Active Problem List   Diagnosis Date Noted   Grief reaction 08/20/2019   Essential hypertension 08/20/2019   Hyperaldosteronism with hyperplasia of adrenal cortex ([place]) 10/06/2018   Herpes zoster without complication 68/12/2120   Elevated blood pressure reading 07/18/2018   Radiculopathy of lumbar region 12/15/2017   Status post lumbar laminectomy 12/15/2017   Intractable chronic migraine without aura and without status migrainosus 08/04/2016   S/P hysterectomy 05/06/2015    Past Surgical History:  Procedure Laterality Date   ABDOMINAL HYSTERECTOMY     BACK SURGERY  2016   [facility] - ruptured L4-5   CESAREAN SECTION     CHOLECYSTECTOMY     CYSTOSCOPY N/A 05/06/2015   Procedure: CYSTOSCOPY;  Surgeon: Aletha Halim, MD;  Location: [facility];  Service: Gynecology;  Laterality: N/A;   ESOPHAGOGASTRODUODENOSCOPY (EGD) WITH PROPOFOL N/A 04/13/2018   Procedure: ESOPHAGOGASTRODUODENOSCOPY (EGD) WITH PROPOFOL;  Surgeon: Lin Landsman, MD;  Location: [facility]COPY;  Service: Gastroenterology;  Laterality: N/A;   ETHMOIDECTOMY Right 11/25/2015   Procedure:  ANTERIOR ETHMOIDECTOMY;  Surgeon: Carloyn Manner, MD;  Location: [place];  Service: ENT;  Laterality: Right;   FRONTAL SINUS EXPLORATION Right 11/25/2015   Procedure: FRONTAL SINUSOTOMY;  Surgeon: Carloyn Manner, MD;  Location: [place];  Service: ENT;  Laterality: Right;   IMAGE GUIDED SINUS SURGERY N/A 11/25/2015   Procedure: IMAGE GUIDED SINUS SURGERY WITH BALLOON;  Surgeon: Carloyn Manner, MD;  Location: [place];  Service: ENT;  Laterality: N/A;   LAPAROSCOPIC HYSTERECTOMY N/A 05/06/2015   Procedure: HYSTERECTOMY TOTAL LAPAROSCOPIC;  Surgeon: Aletha Halim, MD;  Location: [facility];  Service: Gynecology;  Laterality:  N/A;   LUMBAR LAMINECTOMY/DECOMPRESSION MICRODISCECTOMY N/A 10/04/2016   Procedure: right minimally invasive L5 S1 microdiscectomy;  Surgeon: Bayard Hugger, MD;  Location: [facility];  Service: Neurosurgery;  Laterality: N/A;   MAXILLARY ANTROSTOMY Right 11/25/2015   Procedure: MAXILLARY ANTROSTOMY WITH TISSUE REMOVAL;  Surgeon: Carloyn Manner, MD;  Location: [place];  Service: ENT;  Laterality: Right;   WISDOM TOOTH EXTRACTION  10/2115   3 teeth removed.    Family History  Problem Relation Age of Onset   Diabetes Mother    Hypertension Mother    Other Mother        Covid death   Heart disease Mother    Kidney disease Mother        stage 68 renal failure   Diabetes Father    Hypertension Father    Stroke Father    Heart attack Father    Colon cancer Paternal Grandmother 8   Breast cancer Maternal Aunt 85   Breast cancer Paternal Aunt 49    Social History   Tobacco Use   Smoking status: Former    Packs/day: 0.50    Years: 7.00    Pack years: 3.50    Types: Cigarettes    Quit date: 10/19/2015    Years since quitting: 5.4   Smokeless tobacco: Never  Substance Use Topics   Alcohol use: Yes    Comment: socially - 1x/mo     Current Outpatient Medications:    amLODipine (NORVASC) 10 MG tablet, Take 1 tablet (10 mg total) by mouth daily., Disp: 90 tablet, Rfl: 3   cyclobenzaprine (FLEXERIL) 10 MG tablet,  Take 1 tablet by mouth at bedtime., Disp: , Rfl:    gabapentin (NEURONTIN) 300 MG capsule, Take 1 capsule by mouth at bedtime., Disp: , Rfl:    metoprolol succinate (TOPROL-XL) 50 MG 24 hr tablet, Take 1 tablet (50 mg total) by mouth at bedtime. Take with or immediately following a meal., Disp: 30 tablet, Rfl: 1   oxyCODONE (OXY IR/ROXICODONE) 5 MG immediate release tablet, Take by mouth., Disp: , Rfl:    spironolactone (ALDACTONE) 100 MG tablet, Take 1 tablet (100 mg total) by mouth 3 (three) times daily., Disp: 30 tablet, Rfl: 1   varenicline (CHANTIX PAK) 0.5 MG X  11 & 1 MG X 42 tablet, See admin instructions., Disp: , Rfl:   Allergies  Allergen Reactions   Aspirin Other (See Comments)    Causes burning feeling in her stomach.    Adhesive [Tape] Rash    And skin tears from stronger tapes    I personally reviewed {Reviewed:14835} with the patient/caregiver today.   ROS  ***  Objective  There were no vitals filed for this visit.  There is no height or weight on file to calculate BMI.  Physical Exam ***  No results found for this or any previous visit (from the past 2160 hour(s)).  Diabetic Foot Exam: Diabetic Foot Exam - Simple   No data filed    ***  PHQ2/9: Depression screen [facility] 2/9 09/18/2019 08/21/2019 08/20/2019 08/06/2019 05/31/2019  Decreased Interest 2 2 0 2 0  Down, Depressed, Hopeless 2 3 0 1 0  PHQ - 2 Score 4 5 0 3 0  Altered sleeping 3 3 0 1 0  Tired, decreased energy 3 3 0 1 0  Change in appetite 3 3 0 1 0  Feeling bad or failure about yourself  1 2 0 1 0  Trouble concentrating 2 1 0 2 0  Moving slowly or fidgety/restless 2 0 0 0 0  Suicidal thoughts 0 0 0 - 0  PHQ-9 Score 18 17 0 9 0  Difficult doing work/chores - - Not difficult at all Somewhat difficult Not difficult at all    phq 9 is {gen pos [id number]} ***  Fall Risk: Fall Risk  09/27/2019 08/20/2019 08/06/2019 05/31/2019 11/13/2018  Falls in the past year? 0 0 0 0 0  Number falls in past yr: 0 0 0 0 0  Injury with Fall? 0 0 0 0 0  Follow up - Falls evaluation completed Falls evaluation completed Falls evaluation completed -   ***   Functional Status Survey:   ***   Assessment & Plan  *** There are no diagnoses linked to this encounter.

## 2021-04-13 ENCOUNTER — Ambulatory Visit: Payer: Self-pay | Admitting: Family Medicine

## 2021-05-12 ENCOUNTER — Other Ambulatory Visit (HOSPITAL_COMMUNITY): Payer: Self-pay | Admitting: Orthopedic Surgery

## 2021-05-12 ENCOUNTER — Other Ambulatory Visit: Payer: Self-pay | Admitting: Orthopedic Surgery

## 2021-05-12 DIAGNOSIS — S161XXD Strain of muscle, fascia and tendon at neck level, subsequent encounter: Secondary | ICD-10-CM

## 2021-05-19 ENCOUNTER — Other Ambulatory Visit: Payer: Self-pay

## 2021-05-19 ENCOUNTER — Ambulatory Visit
Admission: RE | Admit: 2021-05-19 | Discharge: 2021-05-19 | Disposition: A | Discharge status: Home / Self Care | Payer: BLUE CROSS/BLUE SHIELD | Source: Ambulatory Visit | Attending: Orthopedic Surgery | Admitting: Orthopedic Surgery

## 2021-05-19 DIAGNOSIS — S161XXD Strain of muscle, fascia and tendon at neck level, subsequent encounter: Secondary | ICD-10-CM | POA: Diagnosis present

## 2021-06-26 ENCOUNTER — Other Ambulatory Visit: Payer: Self-pay

## 2021-06-26 ENCOUNTER — Encounter: Payer: Self-pay | Admitting: Emergency Medicine

## 2021-06-26 ENCOUNTER — Ambulatory Visit
Admission: EM | Admit: 2021-06-26 | Discharge: 2021-06-26 | Disposition: A | Discharge status: Home / Self Care | Payer: BLUE CROSS/BLUE SHIELD | Attending: Family Medicine | Admitting: Family Medicine

## 2021-06-26 DIAGNOSIS — M5431 Sciatica, right side: Secondary | ICD-10-CM | POA: Insufficient documentation

## 2021-06-26 LAB — URINALYSIS, COMPLETE (UACMP) WITH MICROSCOPIC
Bilirubin Urine: NEGATIVE
Glucose, UA: NEGATIVE mg/dL
Hgb urine dipstick: NEGATIVE
Ketones, ur: NEGATIVE mg/dL
Leukocytes,Ua: NEGATIVE
Nitrite: NEGATIVE
Protein, ur: NEGATIVE mg/dL
Specific Gravity, Urine: 1.015 (ref 1.005–1.030)
pH: 7 (ref 5.0–8.0)

## 2021-06-26 MED ORDER — OXYCODONE HCL 5 MG PO TABS: 5.0000 mg | ORAL_TABLET | Freq: Four times a day (QID) | ORAL | 0 refills | Status: DC | PRN

## 2021-06-26 MED ORDER — BACLOFEN 10 MG PO TABS: 10.0000 mg | ORAL_TABLET | Freq: Three times a day (TID) | ORAL | 0 refills | Status: DC

## 2021-06-26 MED ORDER — PREDNISONE 10 MG (21) PO TBPK
ORAL_TABLET | ORAL | 0 refills | Status: DC
Start: 2021-06-26 — End: 2021-08-28

## 2021-06-26 NOTE — ED Provider Notes (Signed)
[facility]    CSN: [id number] Arrival date & time: 06/26/21  1008      History   Chief Complaint Chief Complaint  Patient presents with   Back Pain    HPI Linda Rhodes is a 45 y.o. female.   HPI  54 female here for evaluation of low back pain that radiates down her right leg and urinary urgency and frequency.  Patient reports has had the pain and radiation for the last 3 days.  She is been using Tylenol, gabapentin, and Flexeril at home without any relief.  She states her now she has numbness down the outside of her legs all the way down to her toes.  She states that she has had sciatica in the past and feels similar.  She denies any recent injury, falls, or any heavy lifting.  She states she has not had any pain with urination or sending blood in her urine.  Past Medical History:  Diagnosis Date   Abscessed tooth 09/24/2016   right lower tooth removed.   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   Herpes zoster without complication [id number]   Elevated blood pressure reading 07/18/2018   Radiculopathy of lumbar region 12/15/2017   Status post lumbar laminectomy 12/15/2017   Intractable chronic migraine without aura and without status migrainosus 08/04/2016   S/P hysterectomy 05/06/2015    Past Surgical History:  Procedure Laterality Date   ABDOMINAL HYSTERECTOMY     BACK SURGERY  2016   [facility] - ruptured L4-5   CESAREAN SECTION     CHOLECYSTECTOMY     CYSTOSCOPY N/A 05/06/2015   Procedure:  CYSTOSCOPY;  Surgeon: Aletha Halim, MD;  Location: [facility];  Service: Gynecology;  Laterality: N/A;   ESOPHAGOGASTRODUODENOSCOPY (EGD) WITH PROPOFOL N/A 04/13/2018   Procedure: ESOPHAGOGASTRODUODENOSCOPY (EGD) WITH PROPOFOL;  Surgeon: Lin Landsman, MD;  Location: [facility];  Service: Gastroenterology;  Laterality: N/A;   ETHMOIDECTOMY Right 11/25/2015   Procedure:  ANTERIOR ETHMOIDECTOMY;  Surgeon: Carloyn Manner, MD;  Location: [place];  Service: ENT;  Laterality: Right;   FRONTAL SINUS EXPLORATION Right 11/25/2015   Procedure: FRONTAL SINUSOTOMY;  Surgeon: Carloyn Manner, MD;  Location: [place];  Service: ENT;  Laterality: Right;   IMAGE GUIDED SINUS SURGERY N/A 11/25/2015   Procedure: IMAGE GUIDED SINUS SURGERY WITH BALLOON;  Surgeon: Carloyn Manner, MD;  Location: [place];  Service: ENT;  Laterality: N/A;   LAPAROSCOPIC HYSTERECTOMY N/A 05/06/2015   Procedure: HYSTERECTOMY TOTAL LAPAROSCOPIC;  Surgeon: Aletha Halim, MD;  Location: [facility];  Service: Gynecology;  Laterality: N/A;   LUMBAR LAMINECTOMY/DECOMPRESSION MICRODISCECTOMY N/A 10/04/2016   Procedure: right minimally invasive L5 S1 microdiscectomy;  Surgeon: Bayard Hugger, MD;  Location: [facility];  Service: Neurosurgery;  Laterality: N/A;   MAXILLARY ANTROSTOMY Right 11/25/2015   Procedure: MAXILLARY ANTROSTOMY WITH TISSUE REMOVAL;  Surgeon: Carloyn Manner, MD;  Location: [place];  Service: ENT;  Laterality: Right;   WISDOM TOOTH EXTRACTION  10/2115   3 teeth removed.    OB History     Gravida  0   Para  0   Term  0   Preterm  0   AB  0   Living  0      SAB  0   IAB  0   Ectopic  0   Multiple  0   Live Births  0            Home Medications    Prior to Admission medications   Medication Sig Start Date End Date Taking? Authorizing Provider  amLODipine (NORVASC) 10 MG tablet Take 1 tablet (10 mg total) by mouth daily. 09/18/19  Yes Hubbard Hartshorn, FNP  baclofen (LIORESAL) 10 MG tablet Take 1 tablet (10 mg total) by mouth 3 (three) times daily. 06/26/21  Yes Margarette Canada, NP  cyclobenzaprine (FLEXERIL) 10 MG tablet Take 1 tablet by mouth at bedtime. 03/17/21  Yes [provider]  gabapentin (NEURONTIN) 300 MG capsule Take 1 capsule by mouth at bedtime. 02/03/21  Yes [provider]  metoprolol succinate (TOPROL-XL) 50 MG 24 hr tablet Take 1 tablet (50 mg total) by mouth at bedtime. Take with or immediately following a meal. 04/12/20  Yes Melynda Ripple, MD  predniSONE (STERAPRED UNI-PAK 21 TAB) 10 MG (21) TBPK tablet Take 6 tablets on day 1, 5 tablets day 2, 4 tablets day 3, 3 tablets day 4, 2 tablets day 5, 1 tablet day 6 06/26/21  Yes Margarette Canada, NP  spironolactone (ALDACTONE) 100 MG tablet Take 1 tablet (100 mg total) by mouth 3 (three) times daily. 04/12/20  Yes Melynda Ripple, MD  oxyCODONE (OXY IR/ROXICODONE) 5 MG immediate release tablet Take 1 tablet (5 mg total) by mouth every 6 (six) hours as needed for severe pain. 06/26/21   Margarette Canada, NP  varenicline (CHANTIX PAK) 0.5 MG X 11 & 1 MG X 42 tablet See admin instructions. 01/10/17   [provider]  escitalopram (LEXAPRO) 20 MG tablet TAKE 1 TABLET BY MOUTH EVERY DAY 12/13/19 11/14/20  Hubbard Hartshorn, FNP  famotidine (PEPCID) 20 MG tablet Take 1 tablet (20 mg total) by mouth 2 (two) times daily. 03/25/18 08/19/19  Carrie Mew, MD  promethazine (PHENERGAN) 25 MG tablet Take 1 tablet (25 mg total) by mouth every 6 (six) hours as needed for nausea or vomiting. 12/29/19 11/14/20  Amyot, Nicholes Stairs, NP    Family History Family History  Problem Relation Age of Onset   Diabetes Mother    Hypertension Mother    Other Mother        Covid death   Heart disease Mother    Kidney disease Mother        stage 68 renal failure   Diabetes Father    Hypertension Father    Stroke Father    Heart attack Father    Colon cancer Paternal Grandmother 58   Breast cancer  Maternal Aunt 56   Breast cancer Paternal Aunt 56    Social History Social History   Tobacco Use   Smoking status: Former    Packs/day: 0.50    Years: 7.00    Pack years: 3.50    Types: Cigarettes    Quit date: 10/19/2015    Years since quitting: 5.6   Smokeless tobacco: Never  Vaping Use   Vaping Use: Never used  Substance Use Topics   Alcohol use: Yes    Comment: socially - 1x/mo  Drug use: No     Allergies   Aspirin and Adhesive [tape]   Review of Systems Review of Systems  Constitutional:  Negative for activity change, appetite change and fever.  Genitourinary:  Positive for frequency and urgency. Negative for dysuria and hematuria.  Musculoskeletal:  Positive for back pain.  Skin:  Negative for rash and wound.  Neurological:  Positive for numbness. Negative for weakness.  Hematological: Negative.   Psychiatric/Behavioral: Negative.      Physical Exam Triage Vital Signs ED Triage Vitals  Enc Vitals Group     BP 06/26/21 1057 (!) 144/102     Pulse Rate 06/26/21 1057 91     Resp 06/26/21 1057 14     Temp 06/26/21 1057 98.6 F (37 C)     Temp Source 06/26/21 1057 Oral     SpO2 06/26/21 1057 97 %     Weight 06/26/21 1054 135 lb (61.2 kg)     Height 06/26/21 1054 '5\' 7"'$  (1.702 m)     Head Circumference --      Peak Flow --      Pain Score 06/26/21 1053 6     Pain Loc --      Pain Edu? --      Excl. in [place]? --    No data found.  Updated Vital Signs BP (!) 144/102 (BP Location: Left Arm)   Pulse 91   Temp 98.6 F (37 C) (Oral)   Resp 14   Ht '5\' 7"'$  (1.702 m)   Wt 135 lb (61.2 kg)   LMP  (LMP Unknown)   SpO2 97%   BMI 21.14 kg/m   Visual Acuity Right Eye Distance:   Left Eye Distance:   Bilateral Distance:    Right Eye Near:   Left Eye Near:    Bilateral Near:     Physical Exam Vitals and nursing note reviewed.  Constitutional:      General: She is not in acute distress.    Appearance: Normal appearance. She is not ill-appearing.  HENT:      Head: Normocephalic and atraumatic.  Cardiovascular:     Rate and Rhythm: Normal rate and regular rhythm.     Pulses: Normal pulses.     Heart sounds: Normal heart sounds. No murmur heard.   No gallop.  Pulmonary:     Effort: Pulmonary effort is normal.     Breath sounds: Normal breath sounds. No wheezing, rhonchi or rales.  Musculoskeletal:        General: Tenderness present. No swelling or deformity.  Skin:    General: Skin is warm and dry.     Capillary Refill: Capillary refill takes less than 2 seconds.     Findings: No erythema or rash.  Neurological:     General: No focal deficit present.     Mental Status: She is alert and oriented to person, place, and time.     Sensory: No sensory deficit.     Motor: No weakness.     Deep Tendon Reflexes: Reflexes normal.  Psychiatric:        Mood and Affect: Mood normal.        Behavior: Behavior normal.        Thought Content: Thought content normal.        Judgment: Judgment normal.     UC Treatments / Results  Labs (all labs ordered are listed, but only abnormal results are displayed) Labs Reviewed  URINALYSIS, COMPLETE (UACMP) WITH MICROSCOPIC - Abnormal; Notable  for the following components:      Result Value   Bacteria, UA RARE (*)    All other components within normal limits    EKG   Radiology No results found.  Procedures Procedures (including critical care time)  Medications Ordered in UC Medications - No data to display  Initial Impression / Assessment and Plan / UC Course  I have reviewed the triage vital signs and the nursing notes.  Pertinent labs & imaging results that were available during my care of the patient were reviewed by me and considered in my medical decision making (see chart for details).  Patient is a nontoxic-appearing 34 old female here for evaluation of right-sided low back pain that radiates down the outside of her leg and causes numbness down the outside of her leg to her toes  that is been present for last 3 days.  Patient has had sciatica in the past and this feels similar to her previous symptom presentation.  She is also history of spinal fusion.  She initially was concerned she might have a UTI due to having some urinary urgency and frequency but she denies pain with urination or blood in urine.  Urinalysis was collected at triage that is negative for any signs of infection.  Patient's physical exam reveals no midline spine spinous tenderness but she does have right-sided paraspinous tenderness and spasm that radiates into her buttock and down the outside of her right leg.  Bilateral lower extremity strength is 5/5 and her DTRs are 2+ in both lower extremities.  Patient exam is consistent with sciatica and will treat with Medrol Dosepak and baclofen.  Patient had been using Flexeril without any benefit and I have not encouraged her to stop using the Flexeril and switch the baclofen.  Note provided.   Final Clinical Impressions(s) / UC Diagnoses   Final diagnoses:  Sciatica of right side     Discharge Instructions      Take the prednisone according to the package instructions.  You will take it each morning with breakfast.  Make sure that you are always taking it with food.  Take the baclofen every 8 hours as needed for muscle spasm.  Do not take this with the Flexeril.  Use the oxycodone every 6 hours as needed for severe pain.  Do not drink alcohol or drive if you take that medication and do not operate machinery.  Follow the rehabilitation exercises in your discharge paperwork to help you with your sciatic pain.  Return for reevaluation for new or worsening symptoms, or see your primary care provider.     ED Prescriptions     Medication Sig Dispense Auth. Provider   predniSONE (STERAPRED UNI-PAK 21 TAB) 10 MG (21) TBPK tablet Take 6 tablets on day 1, 5 tablets day 2, 4 tablets day 3, 3 tablets day 4, 2 tablets day 5, 1 tablet day 6 21 tablet Margarette Canada, NP   baclofen (LIORESAL) 10 MG tablet Take 1 tablet (10 mg total) by mouth 3 (three) times daily. 30 each Margarette Canada, NP   oxyCODONE (OXY IR/ROXICODONE) 5 MG immediate release tablet Take 1 tablet (5 mg total) by mouth every 6 (six) hours as needed for severe pain. 10 tablet Margarette Canada, NP      I have reviewed the PDMP during this encounter.   Margarette Canada, NP 06/26/21 1141

## 2021-06-26 NOTE — Discharge Instructions (Addendum)
Take the prednisone according to the package instructions.  You will take it each morning with breakfast.  Make sure that you are always taking it with food.  Take the baclofen every 8 hours as needed for muscle spasm.  Do not take this with the Flexeril.  Use the oxycodone every 6 hours as needed for severe pain.  Do not drink alcohol or drive if you take that medication and do not operate machinery.  Follow the rehabilitation exercises in your discharge paperwork to help you with your sciatic pain.  Return for reevaluation for new or worsening symptoms, or see your primary care provider.

## 2021-06-26 NOTE — ED Triage Notes (Signed)
Patient c/o lower back pain and pain that radiates down her right leg that started on Tuesday.  Patient reports urinary urgency and frequency.

## 2021-07-15 ENCOUNTER — Encounter: Payer: Self-pay | Admitting: General Surgery

## 2021-08-28 ENCOUNTER — Other Ambulatory Visit: Payer: Self-pay

## 2021-08-28 ENCOUNTER — Ambulatory Visit
Admission: EM | Admit: 2021-08-28 | Discharge: 2021-08-28 | Disposition: A | Discharge status: Home / Self Care | Payer: BLUE CROSS/BLUE SHIELD | Attending: Physician Assistant | Admitting: Physician Assistant

## 2021-08-28 DIAGNOSIS — B349 Viral infection, unspecified: Secondary | ICD-10-CM | POA: Diagnosis not present

## 2021-08-28 DIAGNOSIS — R051 Acute cough: Secondary | ICD-10-CM | POA: Diagnosis not present

## 2021-08-28 DIAGNOSIS — Z20822 Contact with and (suspected) exposure to covid-19: Secondary | ICD-10-CM | POA: Insufficient documentation

## 2021-08-28 DIAGNOSIS — R5383 Other fatigue: Secondary | ICD-10-CM

## 2021-08-28 LAB — RAPID INFLUENZA A&B ANTIGENS
Influenza A (ARMC): NEGATIVE
Influenza B (ARMC): NEGATIVE

## 2021-08-28 LAB — SARS CORONAVIRUS 2 (TAT 6-24 HRS): SARS Coronavirus 2: NEGATIVE

## 2021-08-28 MED ORDER — PROMETHAZINE-DM 6.25-15 MG/5ML PO SYRP: 5.0000 mL | ORAL_SOLUTION | Freq: Four times a day (QID) | ORAL | 0 refills | Status: DC | PRN

## 2021-08-28 NOTE — ED Triage Notes (Signed)
Pt c/o of cough, fever, headache, fatigue, not able to eat. Sxs x 4 days, fever for first 3 days. Have been taking alka seltza, theraflu at home. Pt takes 2 b/p medications, she is currently out of her metoprolol.

## 2021-08-28 NOTE — Discharge Instructions (Addendum)

## 2021-08-28 NOTE — ED Provider Notes (Signed)
[facility]    CSN: [id number] Arrival date & time: 08/28/21  1009      History   Chief Complaint Chief Complaint  Patient presents with   Cough   Nasal Congestion   Headache    HPI Linda Rhodes is a 45 y.o. female presenting for 4-day history of fever, fatigue, headaches, body aches, cough and congestion.  Patient reports temps up to 102 degrees.  Unsure if she has had a fever today.  Temp currently 98.2 degrees.  Patient has been taking multiple over-the-counter medications including Alka-Seltzer and TheraFlu.  She has been resting.  Patient does not report any shortness of breath, vomiting or diarrhea.  Denies any sick contacts or known exposure to influenza or COVID-19.  She has no other complaints.  HPI  Past Medical History:  Diagnosis Date   Abscessed tooth 09/24/2016   right lower tooth removed.   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   Herpes zoster without complication 42/35/3614   Elevated blood pressure reading 07/18/2018   Radiculopathy of lumbar region 12/15/2017   Status post lumbar laminectomy 12/15/2017   Intractable chronic migraine without aura and without status migrainosus 08/04/2016   S/P hysterectomy 05/06/2015    Past Surgical History:  Procedure Laterality Date   ABDOMINAL HYSTERECTOMY     BACK SURGERY  2016   [facility] - ruptured L4-5   CESAREAN SECTION     CHOLECYSTECTOMY     CYSTOSCOPY N/A 05/06/2015   Procedure: CYSTOSCOPY;  Surgeon: Aletha Halim, MD;  Location: [facility]  ORS;  Service: Gynecology;  Laterality: N/A;   ESOPHAGOGASTRODUODENOSCOPY (EGD) WITH PROPOFOL N/A 04/13/2018   Procedure: ESOPHAGOGASTRODUODENOSCOPY (EGD) WITH PROPOFOL;  Surgeon: Lin Landsman, MD;  Location: [facility] ENDOSCOPY;  Service: Gastroenterology;  Laterality: N/A;   ETHMOIDECTOMY Right 11/25/2015   Procedure:  ANTERIOR ETHMOIDECTOMY;  Surgeon: Carloyn Manner, MD;  Location: [place];  Service: ENT;  Laterality: Right;   FRONTAL SINUS EXPLORATION Right 11/25/2015   Procedure: FRONTAL SINUSOTOMY;  Surgeon: Carloyn Manner, MD;  Location: [place];  Service: ENT;  Laterality: Right;   IMAGE GUIDED SINUS SURGERY N/A 11/25/2015   Procedure: IMAGE GUIDED SINUS SURGERY WITH BALLOON;  Surgeon: Carloyn Manner, MD;  Location: [facility];  Service: ENT;  Laterality: N/A;   LAPAROSCOPIC HYSTERECTOMY N/A 05/06/2015   Procedure: HYSTERECTOMY TOTAL LAPAROSCOPIC;  Surgeon: Aletha Halim, MD;  Location: [facility] ORS;  Service: Gynecology;  Laterality: N/A;   LUMBAR LAMINECTOMY/DECOMPRESSION MICRODISCECTOMY N/A 10/04/2016   Procedure: right minimally invasive L5 S1 microdiscectomy;  Surgeon: Bayard Hugger, MD;  Location: [facility] ORS;  Service: Neurosurgery;  Laterality: N/A;   MAXILLARY ANTROSTOMY Right 11/25/2015   Procedure: MAXILLARY ANTROSTOMY WITH TISSUE REMOVAL;  Surgeon: Carloyn Manner, MD;  Location: [place];  Service: ENT;  Laterality: Right;   WISDOM TOOTH EXTRACTION  10/2115   3 teeth removed.    OB History     Gravida  0   Para  0  Term  0   Preterm  0   AB  0   Living  0      SAB  0   IAB  0   Ectopic  0   Multiple  0   Live Births  0            Home Medications    Prior to Admission medications   Medication Sig Start Date End Date Taking? Authorizing Provider  amLODipine (NORVASC) 10 MG tablet Take 1 tablet (10 mg total) by mouth daily. 09/18/19  Yes Hubbard Hartshorn, FNP  cyclobenzaprine (FLEXERIL) 10 MG tablet Take 1  tablet by mouth at bedtime. 03/17/21  Yes [provider]  gabapentin (NEURONTIN) 300 MG capsule Take 1 capsule by mouth at bedtime. 02/03/21  Yes [provider]  metoprolol succinate (TOPROL-XL) 50 MG 24 hr tablet Take 1 tablet (50 mg total) by mouth at bedtime. Take with or immediately following a meal. 04/12/20  Yes Melynda Ripple, MD  promethazine-dextromethorphan (PROMETHAZINE-DM) 6.25-15 MG/5ML syrup Take 5 mLs by mouth 4 (four) times daily as needed for cough. 08/28/21  Yes Danton Clap, PA-C  spironolactone (ALDACTONE) 100 MG tablet Take 1 tablet (100 mg total) by mouth 3 (three) times daily. 04/12/20  Yes Melynda Ripple, MD  varenicline (CHANTIX PAK) 0.5 MG X 11 & 1 MG X 42 tablet See admin instructions. 01/10/17   [provider]  escitalopram (LEXAPRO) 20 MG tablet TAKE 1 TABLET BY MOUTH EVERY DAY 12/13/19 11/14/20  Hubbard Hartshorn, FNP  famotidine (PEPCID) 20 MG tablet Take 1 tablet (20 mg total) by mouth 2 (two) times daily. 03/25/18 08/19/19  Carrie Mew, MD  promethazine (PHENERGAN) 25 MG tablet Take 1 tablet (25 mg total) by mouth every 6 (six) hours as needed for nausea or vomiting. 12/29/19 11/14/20  Amyot, Nicholes Stairs, NP    Family History Family History  Problem Relation Age of Onset   Diabetes Mother    Hypertension Mother    Other Mother        Covid death   Heart disease Mother    Kidney disease Mother        stage 31 renal failure   Diabetes Father    Hypertension Father    Stroke Father    Heart attack Father    Colon cancer Paternal Grandmother 24   Breast cancer Maternal Aunt 56   Breast cancer Paternal Aunt 70    Social History Social History   Tobacco Use   Smoking status: Former    Packs/day: 0.50    Years: 7.00    Pack years: 3.50    Types: Cigarettes    Quit date: 10/19/2015    Years since quitting: 5.8   Smokeless tobacco: Never  Vaping Use   Vaping Use: Never used  Substance Use Topics   Alcohol use: Yes     Comment: socially - 1x/mo   Drug use: No     Allergies   Aspirin and Adhesive [tape]   Review of Systems Review of Systems  Constitutional:  Positive for fatigue. Negative for chills, diaphoresis and fever.  HENT:  Positive for congestion, rhinorrhea and sore throat. Negative for ear pain, sinus pressure and sinus pain.   Respiratory:  Positive for cough. Negative for shortness of breath.   Gastrointestinal:  Negative for abdominal pain, nausea and vomiting.  Musculoskeletal:  Positive for myalgias. Negative for arthralgias.  Skin:  Negative for rash.  Neurological:  Positive for headaches. Negative  for weakness.  Hematological:  Negative for adenopathy.  Psychiatric/Behavioral:  Positive for sleep disturbance (due to cough).     Physical Exam Triage Vital Signs ED Triage Vitals [08/28/21 1018]  Enc Vitals Group     BP      Pulse      Resp      Temp      Temp src      SpO2      Weight 135 lb (61.2 kg)     Height 5\' 7"  (1.702 m)     Head Circumference      Peak Flow      Pain Score 5     Pain Loc      Pain Edu?      Excl. in [place]?    No data found.  Updated Vital Signs BP (!) 149/111 (BP Location: Left Arm)   Pulse 88   Temp 98.2 F (36.8 C) (Oral)   Resp 18   Ht 5\' 7"  (1.702 m)   Wt 135 lb (61.2 kg)   LMP  (LMP Unknown)   SpO2 98%   BMI 21.14 kg/m      Physical Exam Vitals and nursing note reviewed.  Constitutional:      General: She is not in acute distress.    Appearance: Normal appearance. She is well-developed. She is ill-appearing. She is not toxic-appearing.  HENT:     Head: Normocephalic and atraumatic.     Nose: Congestion present.     Mouth/Throat:     Mouth: Mucous membranes are moist.     Pharynx: Oropharynx is clear. Posterior oropharyngeal erythema present.  Eyes:     General: No scleral icterus.       Right eye: No discharge.        Left eye: No discharge.     Conjunctiva/sclera: Conjunctivae normal.  Cardiovascular:     Rate and  Rhythm: Normal rate and regular rhythm.     Heart sounds: Normal heart sounds.  Pulmonary:     Effort: Pulmonary effort is normal. No respiratory distress.     Breath sounds: Normal breath sounds.  Musculoskeletal:     Cervical back: Neck supple.  Skin:    General: Skin is dry.  Neurological:     General: No focal deficit present.     Mental Status: She is alert. Mental status is at baseline.     Motor: No weakness.     Gait: Gait normal.  Psychiatric:        Mood and Affect: Mood normal.        Behavior: Behavior normal.        Thought Content: Thought content normal.     UC Treatments / Results  Labs (all labs ordered are listed, but only abnormal results are displayed) Labs Reviewed  RAPID INFLUENZA A&B ANTIGENS  SARS CORONAVIRUS 2 (TAT 6-24 HRS)    EKG   Radiology No results found.  Procedures Procedures (including critical care time)  Medications Ordered in UC Medications - No data to display  Initial Impression / Assessment and Plan / UC Course  I have reviewed the triage vital signs and the nursing notes.  Pertinent labs & imaging results that were available during my care of the patient were reviewed by me and considered in my medical decision making (see chart for details).  45 year old female presenting for 4-day history of fever, fatigue, body aches, headaches, cough, congestion and sore throat.  Vitals are stable.  She is currently  afebrile.  Exam is significant for ill-appearing female.  She has nasal congestion on exam and posterior pharyngeal erythema.  Her chest is clear to auscultation heart regular rate and rhythm.  Rapid flu testing obtained. Negative results.  Discussed with patient. PCR COVID test obtained.  Current CDC guidelines, isolation protocol and ED precautions reviewed.  Advised patient symptoms consistent with viral illness.  Supportive care encouraged with increasing rest and fluids.  I have sent in Promethazine DM for cough.   Tylenol/ibuprofen as needed for aches and fever.  Patient declines a work note.  Reviewed return and ED precautions.   Final Clinical Impressions(s) / UC Diagnoses   Final diagnoses:  Viral illness  Acute cough  Other fatigue     Discharge Instructions      URI/COLD SYMPTOMS: Your exam today is consistent with a viral illness. Antibiotics are not indicated at this time. Use medications as directed, including cough syrup, nasal saline, and decongestants. Your symptoms should improve over the next few days and resolve within 7-10 days. Increase rest and fluids. F/u if symptoms worsen or predominate such as sore throat, ear pain, productive cough, shortness of breath, or if you develop high fevers or worsening fatigue over the next several days.    You have received COVID testing today either for positive exposure, concerning symptoms that could be related to COVID infection, screening purposes, or re-testing after confirmed positive.  Your test obtained today checks for active viral infection in the last 1-2 weeks. If your test is negative now, you can still test positive later. So, if you do develop symptoms you should either get re-tested and/or isolate x 5 days and then strict mask use x 5 days (unvaccinated) or mask use x 10 days (vaccinated). Please follow CDC guidelines.  While Rapid antigen tests come back in 15-20 minutes, send out PCR/molecular test results typically come back within 1-3 days. In the mean time, if you are symptomatic, assume this could be a positive test and treat/monitor yourself as if you do have COVID.   We will call with test results if positive. Please download the MyChart app and set up a profile to access test results.   If symptomatic, go home and rest. Push fluids. Take Tylenol as needed for discomfort. Gargle warm salt water. Throat lozenges. Take Mucinex DM or Robitussin for cough. Humidifier in bedroom to ease coughing. Warm showers. Also review the COVID  handout for more information.  COVID-19 INFECTION: The incubation period of COVID-19 is approximately 14 days after exposure, with most symptoms developing in roughly 4-5 days. Symptoms may range in severity from mild to critically severe. Roughly 80% of those infected will have mild symptoms. People of any age may become infected with COVID-19 and have the ability to transmit the virus. The most common symptoms include: fever, fatigue, cough, body aches, headaches, sore throat, nasal congestion, shortness of breath, nausea, vomiting, diarrhea, changes in smell and/or taste.    COURSE OF ILLNESS Some patients may begin with mild disease which can progress quickly into critical symptoms. If your symptoms are worsening please call ahead to the Emergency Department and proceed there for further treatment. Recovery time appears to be roughly 1-2 weeks for mild symptoms and 3-6 weeks for severe disease.   GO IMMEDIATELY TO ER FOR FEVER YOU ARE UNABLE TO GET DOWN WITH TYLENOL, BREATHING PROBLEMS, CHEST PAIN, FATIGUE, LETHARGY, INABILITY TO EAT OR DRINK, ETC  QUARANTINE AND ISOLATION: To help decrease the spread of COVID-19 please remain  isolated if you have COVID infection or are highly suspected to have COVID infection. This means -stay home and isolate to one room in the home if you live with others. Do not share a bed or bathroom with others while ill, sanitize and wipe down all countertops and keep common areas clean and disinfected. Stay home for 5 days. If you have no symptoms or your symptoms are resolving after 5 days, you can leave your house. Continue to wear a mask around others for 5 additional days. If you have been in close contact (within 6 feet) of someone diagnosed with COVID 19, you are advised to quarantine in your home for 14 days as symptoms can develop anywhere from 2-14 days after exposure to the virus. If you develop symptoms, you  must isolate.  Most current guidelines for COVID after  exposure -unvaccinated: isolate 5 days and strict mask use x 5 days. Test on day 5 is possible -vaccinated: wear mask x 10 days if symptoms do not develop -You do not necessarily need to be tested for COVID if you have + exposure and  develop symptoms. Just isolate at home x10 days from symptom onset During this global pandemic, CDC advises to practice social distancing, try to stay at least 66ft away from others at all times. Wear a face covering. Wash and sanitize your hands regularly and avoid going anywhere that is not necessary.  KEEP IN MIND THAT THE COVID TEST IS NOT 100% ACCURATE AND YOU SHOULD STILL DO EVERYTHING TO PREVENT POTENTIAL SPREAD OF VIRUS TO OTHERS (WEAR MASK, WEAR GLOVES, [place] HANDS AND SANITIZE REGULARLY). IF INITIAL TEST IS NEGATIVE, THIS MAY NOT MEAN YOU ARE DEFINITELY NEGATIVE. MOST ACCURATE TESTING IS DONE 5-7 DAYS AFTER EXPOSURE.   It is not advised by CDC to get re-tested after receiving a positive COVID test since you can still test positive for weeks to months after you have already cleared the virus.   *If you have not been vaccinated for COVID, I strongly suggest you consider getting vaccinated as long as there are no contraindications.       ED Prescriptions     Medication Sig Dispense Auth. Provider   promethazine-dextromethorphan (PROMETHAZINE-DM) 6.25-15 MG/5ML syrup Take 5 mLs by mouth 4 (four) times daily as needed for cough. 118 mL Danton Clap, PA-C      PDMP not reviewed this encounter.   Danton Clap, PA-C 08/28/21 1133

## 2021-08-31 ENCOUNTER — Encounter: Payer: Self-pay | Admitting: Family Medicine

## 2021-08-31 ENCOUNTER — Ambulatory Visit: Payer: Self-pay | Admitting: *Deleted

## 2021-08-31 ENCOUNTER — Telehealth (INDEPENDENT_AMBULATORY_CARE_PROVIDER_SITE_OTHER): Payer: BLUE CROSS/BLUE SHIELD | Admitting: Family Medicine

## 2021-08-31 VITALS — BP 145/101 | Ht 67.0 in | Wt 135.0 lb

## 2021-08-31 DIAGNOSIS — J069 Acute upper respiratory infection, unspecified: Secondary | ICD-10-CM

## 2021-08-31 MED ORDER — BENZONATATE 200 MG PO CAPS: 200.0000 mg | ORAL_CAPSULE | Freq: Two times a day (BID) | ORAL | 0 refills | Status: DC | PRN

## 2021-08-31 MED ORDER — METHYLPREDNISOLONE 4 MG PO TBPK: ORAL_TABLET | ORAL | 0 refills | Status: DC

## 2021-08-31 NOTE — Patient Instructions (Signed)
It was great to see you!  Our plans for today:  - Take the steroids as prescribed. - Try the tessalon pills for your cough. - If you are not feeling better a few days after taking the steroids, let us know.  - Certainly, if you have difficulties breathing, chest pain, or are unable to keep down fluids, go to the hospital.  Take care and seek immediate care sooner if you develop any concerns.   Dr. Ky Barban

## 2021-08-31 NOTE — Telephone Encounter (Signed)
Per agent:  "Patient called in to inquire if its possible to get an appointment today was seen at urgent care on Friday 08/28/21 had a covid and flu test done but negative. Patient would like to be seen today if possible even if it is a virtual visit. Has cough, sore throat runny nose and just feeling bad for aver one week  Please call Ph#  [phone] "  Pt reports seen in UC Friday, "Viral URI" Prescribed Promethazine DM, "Helps a little, cough comes right back." Covid, flu negative. Denies fever, no chest tightness. Cough productive for brownish yellow phlegm. Reports some wheezing after bad coughing spell. Pt requesting virtual appt today at practice. Last seen by [name]. Pt was No show with Dr. Ancil Boozer 04/13/21  but states she has been seen at practice since.  Attempted to reach practice, extended hold. Care advise given, assured pt NT would route to practice for review. Please advise pt. [phone]      Reason for Disposition  [1] Continuous (nonstop) coughing interferes with work or school AND [2] no improvement using cough treatment per Care Advice  Answer Assessment - Initial Assessment Questions 1. ONSET: "When did the cough begin?"      LAst Monday, cough worse 2. SEVERITY: "How bad is the cough today?"      Moderate, spells, awake at night 3. SPUTUM: "Describe the color of your sputum" (none, dry cough; clear, white, yellow, green)     Brown yellowish 4. HEMOPTYSIS: "Are you coughing up any blood?" If so ask: "How much?" (flecks, streaks, tablespoons, etc.)     no 5. DIFFICULTY BREATHING: "Are you having difficulty breathing?" If Yes, ask: "How bad is it?" (e.g., mild, moderate, severe)    - MILD: No SOB at rest, mild SOB with walking, speaks normally in sentences, can lie down, no retractions, pulse < 100.    - MODERATE: SOB at rest, SOB with minimal exertion and prefers to sit, cannot lie down flat, speaks in phrases, mild retractions, audible wheezing, pulse 100-120.     - SEVERE: Very SOB at rest, speaks in single words, struggling to breathe, sitting hunched forward, retractions, pulse > 120      no 6. FEVER: "Do you have a fever?" If Yes, ask: "What is your temperature, how was it measured, and when did it start?"     no 7. CARDIAC HISTORY: "Do you have any history of heart disease?" (e.g., heart attack, congestive heart failure)      no 8. LUNG HISTORY: "Do you have any history of lung disease?"  (e.g., pulmonary embolus, asthma, emphysema)     no 9. PE RISK FACTORS: "Do you have a history of blood clots?" (or: recent major surgery, recent prolonged travel, bedridden)     no 10. OTHER SYMPTOMS: "Do you have any other symptoms?" (e.g., runny nose, wheezing, chest pain)       Little wheezing after bad coughing spell  Protocols used: Cough - Acute Productive-A-AH

## 2021-08-31 NOTE — Telephone Encounter (Signed)
Virtual appt scheduled with Dr Ky Barban for today

## 2021-08-31 NOTE — Progress Notes (Signed)
Virtual Visit via Video Note  I connected with Linda Rhodes on 08/31/21 at 11:40 AM EST by a video enabled telemedicine application and verified that I am speaking with the correct person using two identifiers.  Location: Patient: home Provider: [facility]   I discussed the limitations of evaluation and management by telemedicine and the availability of in person appointments. The patient expressed understanding and agreed to proceed.  History of Present Illness:  UPPER RESPIRATORY TRACT INFECTION - symptom onset 11/7 - negative COVID and flu at Urgent Care Friday  Fever: no Cough: yes, productive of phlegm Shortness of breath: only with coughing spells Wheezing: no Chest pain: no Chest tightness: no Chest congestion: yes Nasal congestion: yes Sore throat: yes Sinus pressure: yes Headache: yes Vomiting: no Rash: no Context: stable H/o chronic sinusitis s/p R sided ethmoidectomy, frontal sinus exploration 2017. Relief with OTC cold/cough medications: no  Treatments attempted: dayquil, nyquil, theraflu, cough syrup, tylenol    Observations/Objective:  Tired appearing, congested sounding. In NAD. Speaks in full sentences, no respiratory distress.   Assessment and Plan:  VIRAL URI Moderate symptoms. COVID and flu negative. Will provide short steroid course given no relief with OTC decongestants. Reasonable to defer antibiotics at this time given duration of symptoms, no longer febrile, and symptoms otherwise consistent with viral URI. Consider oral antibiotics if no relief with steroids given h/o chronic sinusitis and sx >10 days at that point. Reviewed emergency precautions. F/u prn.   I discussed the assessment and treatment plan with the patient. The patient was provided an opportunity to ask questions and all were answered. The patient agreed with the plan and demonstrated an understanding of the instructions.   The patient was advised to call back or seek an in-person  evaluation if the symptoms worsen or if the condition fails to improve as anticipated.  I provided 6 minutes of non-face-to-face time during this encounter.   Myles Gip, DO

## 2021-09-24 ENCOUNTER — Telehealth: Payer: BLUE CROSS/BLUE SHIELD | Admitting: Physician Assistant

## 2021-09-24 DIAGNOSIS — B029 Zoster without complications: Secondary | ICD-10-CM

## 2021-09-24 DIAGNOSIS — M5431 Sciatica, right side: Secondary | ICD-10-CM

## 2021-09-24 MED ORDER — METHYLPREDNISOLONE 4 MG PO TBPK: ORAL_TABLET | ORAL | 0 refills | Status: DC

## 2021-09-24 MED ORDER — VALACYCLOVIR HCL 1 G PO TABS: 1000.0000 mg | ORAL_TABLET | Freq: Three times a day (TID) | ORAL | 0 refills | Status: AC

## 2021-09-24 MED ORDER — CYCLOBENZAPRINE HCL 10 MG PO TABS: 5.0000 mg | ORAL_TABLET | Freq: Three times a day (TID) | ORAL | 0 refills | Status: DC | PRN

## 2021-09-24 NOTE — Patient Instructions (Signed)
Dossie Arbour, thank you for joining Mar Daring, PA-C for today's virtual visit.  While this provider is not your primary care provider (PCP), if your PCP is located in our provider database this encounter information will be shared with them immediately following your visit.  Consent: (Patient) Linda Rhodes provided verbal consent for this virtual visit at the beginning of the encounter.  Current Medications:  Current Outpatient Medications:    cyclobenzaprine (FLEXERIL) 10 MG tablet, Take 0.5-1 tablets (5-10 mg total) by mouth 3 (three) times daily as needed for muscle spasms., Disp: 30 tablet, Rfl: 0   methylPREDNISolone (MEDROL DOSEPAK) 4 MG TBPK tablet, 6 day taper; take as directed on package instructions, Disp: 21 tablet, Rfl: 0   valACYclovir (VALTREX) 1000 MG tablet, Take 1 tablet (1,000 mg total) by mouth 3 (three) times daily for 7 days., Disp: 21 tablet, Rfl: 0   amLODipine (NORVASC) 10 MG tablet, Take 1 tablet (10 mg total) by mouth daily., Disp: 90 tablet, Rfl: 3   gabapentin (NEURONTIN) 300 MG capsule, Take 1 capsule by mouth at bedtime., Disp: , Rfl:    metoprolol succinate (TOPROL-XL) 50 MG 24 hr tablet, Take 1 tablet (50 mg total) by mouth at bedtime. Take with or immediately following a meal., Disp: 30 tablet, Rfl: 1   spironolactone (ALDACTONE) 100 MG tablet, Take 1 tablet (100 mg total) by mouth 3 (three) times daily., Disp: 30 tablet, Rfl: 1   varenicline (CHANTIX PAK) 0.5 MG X 11 & 1 MG X 42 tablet, See admin instructions., Disp: , Rfl:    Medications ordered in this encounter:  Meds ordered this encounter  Medications   valACYclovir (VALTREX) 1000 MG tablet    Sig: Take 1 tablet (1,000 mg total) by mouth 3 (three) times daily for 7 days.    Dispense:  21 tablet    Refill:  0    Order Specific Question:   Supervising Provider    Answer:   Sabra Heck, BRIAN [[id number]]   cyclobenzaprine (FLEXERIL) 10 MG tablet    Sig: Take 0.5-1 tablets (5-10 mg total) by mouth  3 (three) times daily as needed for muscle spasms.    Dispense:  30 tablet    Refill:  0    Order Specific Question:   Supervising Provider    Answer:   MILLER, BRIAN [[id number]]   methylPREDNISolone (MEDROL DOSEPAK) 4 MG TBPK tablet    Sig: 6 day taper; take as directed on package instructions    Dispense:  21 tablet    Refill:  0    Order Specific Question:   Supervising Provider    Answer:   Sabra Heck, BRIAN [[id number]]     *If you need refills on other medications prior to your next appointment, please contact your pharmacy*  Follow-Up: Call back or seek an in-person evaluation if the symptoms worsen or if the condition fails to improve as anticipated.  Other Instructions Sciatica Sciatica is pain, weakness, tingling, or loss of feeling (numbness) along the sciatic nerve. The sciatic nerve starts in the lower back and goes down the back of each leg. Sciatica usually goes away on its own or with treatment. Sometimes, sciatica may come back (recur). What are the causes? This condition happens when the sciatic nerve is pinched or has pressure put on it. This may be the result of: A disk in between the bones of the spine bulging out too far (herniated disk). Changes in the spinal disks that occur with aging. A condition  that affects a muscle in the butt. Extra bone growth near the sciatic nerve. A break (fracture) of the area between your hip bones (pelvis). Pregnancy. Tumor. This is rare. What increases the risk? You are more likely to develop this condition if you: Play sports that put pressure or stress on the spine. Have poor strength and ease of movement (flexibility). Have had a back injury in the past. Have had back surgery. Sit for long periods of time. Do activities that involve bending or lifting over and over again. Are very overweight (obese). What are the signs or symptoms? Symptoms can vary from mild to very bad. They may include: Any of these problems in the lower back,  leg, hip, or butt: Mild tingling, loss of feeling, or dull aches. Burning sensations. Sharp pains. Loss of feeling in the back of the calf or the sole of the foot. Leg weakness. Very bad back pain that makes it hard to move. These symptoms may get worse when you cough, sneeze, or laugh. They may also get worse when you sit or stand for long periods of time. How is this treated? This condition often gets better without any treatment. However, treatment may include: Changing or cutting back on physical activity when you have pain. Doing exercises and stretching. Putting ice or heat on the affected area. Medicines that help: To relieve pain and swelling. To relax your muscles. Shots (injections) of medicines that help to relieve pain, irritation, and swelling. Surgery. Follow these instructions at home: Medicines Take over-the-counter and prescription medicines only as told by your doctor. Ask your doctor if the medicine prescribed to you: Requires you to avoid driving or using heavy machinery. Can cause trouble pooping (constipation). You may need to take these steps to prevent or treat trouble pooping: Drink enough fluids to keep your pee (urine) pale yellow. Take over-the-counter or prescription medicines. Eat foods that are high in fiber. These include beans, whole grains, and fresh fruits and vegetables. Limit foods that are high in fat and sugar. These include fried or sweet foods. Managing pain   If told, put ice on the affected area. Put ice in a plastic bag. Place a towel between your skin and the bag. Leave the ice on for 20 minutes, 2-3 times a day. If told, put heat on the affected area. Use the heat source that your doctor tells you to use, such as a moist heat pack or a heating pad. Place a towel between your skin and the heat source. Leave the heat on for 20-30 minutes. Remove the heat if your skin turns bright red. This is very important if you are unable to feel  pain, heat, or cold. You may have a greater risk of getting burned. Activity  Return to your normal activities as told by your doctor. Ask your doctor what activities are safe for you. Avoid activities that make your symptoms worse. Take short rests during the day. When you rest for a long time, do some physical activity or stretching between periods of rest. Avoid sitting for a long time without moving. Get up and move around at least one time each hour. Exercise and stretch regularly, as told by your doctor. Do not lift anything that is heavier than 10 lb (4.5 kg) while you have symptoms of sciatica. Avoid lifting heavy things even when you do not have symptoms. Avoid lifting heavy things over and over. When you lift objects, always lift in a way that is safe for your  body. To do this, you should: Bend your knees. Keep the object close to your body. Avoid twisting. General instructions Stay at a healthy weight. Wear comfortable shoes that support your feet. Avoid wearing high heels. Avoid sleeping on a mattress that is too soft or too hard. You might have less pain if you sleep on a mattress that is firm enough to support your back. Keep all follow-up visits as told by your doctor. This is important. Contact a doctor if: You have pain that: Wakes you up when you are sleeping. Gets worse when you lie down. Is worse than the pain you have had in the past. Lasts longer than 4 weeks. You lose weight without trying. Get help right away if: You cannot control when you pee (urinate) or poop (have a bowel movement). You have weakness in any of these areas and it gets worse: Lower back. The area between your hip bones. Butt. Legs. You have redness or swelling of your back. You have a burning feeling when you pee. Summary Sciatica is pain, weakness, tingling, or loss of feeling (numbness) along the sciatic nerve. This condition happens when the sciatic nerve is pinched or has pressure  put on it. Sciatica can cause pain, tingling, or loss of feeling (numbness) in the lower back, legs, hips, and butt. Treatment often includes rest, exercise, medicines, and putting ice or heat on the affected area. This information is not intended to replace advice given to you by your health care provider. Make sure you discuss any questions you have with your health care provider. Document Revised: 10/23/2018 Document Reviewed: 10/23/2018 Elsevier Patient Education  2022 [place].  Sciatica Rehab Ask your health care provider which exercises are safe for you. Do exercises exactly as told by your health care provider and adjust them as directed. It is normal to feel mild stretching, pulling, tightness, or discomfort as you do these exercises. Stop right away if you feel sudden pain or your pain gets worse. Do not begin these exercises until told by your health care provider. Stretching and range-of-motion exercises These exercises warm up your muscles and joints and improve the movement and flexibility of your hips and back. These exercises also help to relieve pain, numbness, and tingling. Sciatic nerve glide Sit in a chair with your head facing down toward your chest. Place your hands behind your back. Let your shoulders slump forward. Slowly straighten one of your legs while you tilt your head back as if you are looking toward the ceiling. Only straighten your leg as far as you can without making your symptoms worse. Hold this position for __________ seconds. Slowly return to the starting position. Repeat with your other leg. Repeat __________ times. Complete this exercise __________ times a day. Knee to chest with hip adduction and internal rotation  Lie on your back on a firm surface with both legs straight. Bend one of your knees and move it up toward your chest until you feel a gentle stretch in your lower back and buttock. Then, move your knee toward the shoulder that is on the  opposite side from your leg. This is hip adduction and internal rotation. Hold your leg in this position by holding on to the front of your knee. Hold this position for __________ seconds. Slowly return to the starting position. Repeat with your other leg. Repeat __________ times. Complete this exercise __________ times a day. Prone extension on elbows  Lie on your abdomen on a firm surface. A bed  may be too soft for this exercise. Prop yourself up on your elbows. Use your arms to help lift your chest up until you feel a gentle stretch in your abdomen and your lower back. This will place some of your body weight on your elbows. If this is uncomfortable, try stacking pillows under your chest. Your hips should stay down, against the surface that you are lying on. Keep your hip and back muscles relaxed. Hold this position for __________ seconds. Slowly relax your upper body and return to the starting position. Repeat __________ times. Complete this exercise __________ times a day. Strengthening exercises These exercises build strength and endurance in your back. Endurance is the ability to use your muscles for a long time, even after they get tired. Pelvic tilt This exercise strengthens the muscles that lie deep in the abdomen. Lie on your back on a firm surface. Bend your knees and keep your feet flat on the floor. Tense your abdominal muscles. Tip your pelvis up toward the ceiling and flatten your lower back into the floor. To help with this exercise, you may place a small towel under your lower back and try to push your back into the towel. Hold this position for __________ seconds. Let your muscles relax completely before you repeat this exercise. Repeat __________ times. Complete this exercise __________ times a day. Alternating arm and leg raises  Get on your hands and knees on a firm surface. If you are on a hard floor, you may want to use padding, such as an exercise mat, to cushion  your knees. Line up your arms and legs. Your hands should be directly below your shoulders, and your knees should be directly below your hips. Lift your left leg behind you. At the same time, raise your right arm and straighten it in front of you. Do not lift your leg higher than your hip. Do not lift your arm higher than your shoulder. Keep your abdominal and back muscles tight. Keep your hips facing the ground. Do not arch your back. Keep your balance carefully, and do not hold your breath. Hold this position for __________ seconds. Slowly return to the starting position. Repeat with your right leg and your left arm. Repeat __________ times. Complete this exercise __________ times a day. Posture and body mechanics Good posture and healthy body mechanics can help to relieve stress in your body's tissues and joints. Body mechanics refers to the movements and positions of your body while you do your daily activities. Posture is part of body mechanics. Good posture means: Your spine is in its natural S-curve position (neutral). Your shoulders are pulled back slightly. Your head is not tipped forward. Follow these guidelines to improve your posture and body mechanics in your everyday activities. Standing  When standing, keep your spine neutral and your feet about hip width apart. Keep a slight bend in your knees. Your ears, shoulders, and hips should line up. When you do a task in which you stand in one place for a long time, place one foot up on a stable object that is 2-4 inches (5-10 cm) high, such as a footstool. This helps keep your spine neutral. Sitting  When sitting, keep your spine neutral and keep your feet flat on the floor. Use a footrest, if necessary, and keep your thighs parallel to the floor. Avoid rounding your shoulders, and avoid tilting your head forward. When working at a desk or a computer, keep your desk at a height where your  hands are slightly lower than your elbows.  Slide your chair under your desk so you are close enough to maintain good posture. When working at a computer, place your monitor at a height where you are looking straight ahead and you do not have to tilt your head forward or downward to look at the screen. Resting When lying down and resting, avoid positions that are most painful for you. If you have pain with activities such as sitting, bending, stooping, or squatting, lie in a position in which your body does not bend very much. For example, avoid curling up on your side with your arms and knees near your chest (fetal position). If you have pain with activities such as standing for a long time or reaching with your arms, lie with your spine in a neutral position and bend your knees slightly. Try the following positions: Lying on your side with a pillow between your knees. Lying on your back with a pillow under your knees. Lifting  When lifting objects, keep your feet at least shoulder width apart and tighten your abdominal muscles. Bend your knees and hips and keep your spine neutral. It is important to lift using the strength of your legs, not your back. Do not lock your knees straight out. Always ask for help to lift heavy or awkward objects. This information is not intended to replace advice given to you by your health care provider. Make sure you discuss any questions you have with your health care provider. Document Revised: 01/26/2019 Document Reviewed: 10/26/2018 Elsevier Patient Education  2022 [place] is an infection. It gives you a painful skin rash and blisters that have fluid in them. Shingles is caused by the same germ (virus) that causes chickenpox. Shingles only happens in people who: Have had chickenpox. Have been given a shot (vaccine) to protect against chickenpox. Shingles is rare in this group. What are the causes? This condition is caused by varicella-zoster virus. This is the same germ  that causes chickenpox. After a person is exposed to the germ, the germ stays in the body but is not active (dormant). Shingles develops if the germ becomes active again (is reactivated). This can happen many years after the first exposure to the germ. It is not known what causes this germ to become active again. What increases the risk? People who have had chickenpox or received the chickenpox shot are at risk for shingles. This infection is more common in people who: Are older than 43 years of age. Have a weakened disease-fighting system (immune system), such as people with: HIV (human immunodeficiency virus). AIDS (acquired immunodeficiency syndrome). Cancer. Are taking medicines that weaken the immune system, such as organ transplant medicines. Have a lot of stress. What are the signs or symptoms? The first symptoms of shingles may be itching, tingling, or pain in an area on your skin. A rash will show on your skin a few days or weeks later. This is what usually happens: The rash is likely to be on one side of your body. The rash usually has a shape like a belt or a band. Over time, the rash turns into fluid-filled blisters. The blisters will break open and change into scabs. The scabs usually dry up in about 2-3 weeks. You may also have: A fever. Chills. A headache. A feeling like you may vomit (nausea). How is this treated? The rash may last for several weeks. There is not a specific cure for this condition.  Your doctor may prescribe medicines. Medicines may: Help with pain. Help you get better sooner. Help to prevent long-term problems. Help with itching (antihistamines). If the area involved is on your face, you may need to see a specialist. This may be an eye doctor or an ear, nose, and throat (ENT) doctor. Follow these instructions at home: Medicines Take over-the-counter and prescription medicines only as told by your doctor. Put on an anti-itch cream or numbing cream  where you have a rash, blisters, or scabs. Do this as told by your doctor. Helping with itching and discomfort  Put cold, wet cloths (cold compresses) on the area of the rash or blisters as told by your doctor. Cool baths can help you feel better. Try adding baking soda or dry oatmeal to the water to lessen itching. Do not bathe in hot water. Use calamine lotion as told by your doctor. Blister and rash care Keep your rash covered with a loose bandage (dressing). Wear loose clothing that does not rub on your rash. Wash your hands with soap and water for at least 20 seconds before and after you change your bandage. If you cannot use soap and water, use hand sanitizer. Change your bandage as told by your doctor. Keep your rash and blisters clean. To do this, wash the area with mild soap and cool water as told by your doctor. Check your rash every day for signs of infection. Check for: More redness, swelling, or pain. Fluid or blood. Warmth. Pus or a bad smell. Do not scratch your rash. Do not pick at your blisters. To help you to not scratch: Keep your fingernails clean and cut short. Wear gloves or mittens when you sleep, if scratching is a problem. General instructions Rest as told by your doctor. Wash your hands often with soap and water for at least 20 seconds. If you cannot use soap and water, use hand sanitizer. Doing this lowers your chance of getting a skin infection. Your infection can cause chickenpox in people who have never had chickenpox or never got a chickenpox vaccine shot. If you have blisters that did not change into scabs yet, try not to touch other people or be around other people, especially: Babies. Pregnant women. Children who have areas of red, itchy, or rough skin (eczema). Older people who have organ transplants. People who have a long-term (chronic) illness, like cancer or AIDS. Keep all follow-up visits. How is this prevented? A vaccine shot is the best way to  prevent shingles and protect against shingles problems. If you have not had a vaccine shot, talk with your doctor about getting it. Where to find more information Centers for Disease Control and Prevention: http://www.wolf.info/ Contact a doctor if: Your pain does not get better with medicine. Your pain does not get better after the rash heals. You have any of these signs of infection around the rash: More redness, swelling, or pain. Fluid or blood. Warmth. Pus or a bad smell. You have a fever. Get help right away if: The rash is on your face or nose. You have pain in your face or pain by your eye. You lose feeling on one side of your face. You have trouble seeing. You have ear pain, or you have ringing in your ear. You have a loss of taste. Your condition gets worse. Summary Shingles gives you a painful skin rash and blisters that have fluid in them. Shingles is caused by the same germ (virus) that causes chickenpox. Keep your rash  covered with a loose bandage. Wear loose clothing that does not rub on your rash. If you have blisters that did not change into scabs yet, try not to touch other people or be around people. This information is not intended to replace advice given to you by your health care provider. Make sure you discuss any questions you have with your health care provider. Document Revised: 09/29/2020 Document Reviewed: 09/29/2020 Elsevier Patient Education  2022 [employer].    If you have been instructed to have an in-person evaluation today at a local Urgent Care facility, please use the link below. It will take you to a list of all of our available [facility], including address, phone number and hours of operation. Please do not delay care.  [facility]  If you or a family member do not have a primary care provider, use the link below to schedule a visit and establish care. When you choose a [facility] primary care physician or advanced  practice provider, you gain a long-term partner in health. Find a Primary Care Provider  Learn more about [facility] in-office and virtual care options: [facility]

## 2021-09-24 NOTE — Progress Notes (Signed)
Virtual Visit Consent   [name], you are scheduled for a virtual visit with a [facility] provider today.     Just as with appointments in the office, your consent must be obtained to participate.  Your consent will be active for this visit and any virtual visit you may have with one of our providers in the next 365 days.     If you have a MyChart account, a copy of this consent can be sent to you electronically.  All virtual visits are billed to your insurance company just like a traditional visit in the office.    As this is a virtual visit, video technology does not allow for your provider to perform a traditional examination.  This may limit your provider's ability to fully assess your condition.  If your provider identifies any concerns that need to be evaluated in person or the need to arrange testing (such as labs, EKG, etc.), we will make arrangements to do so.     Although advances in technology are sophisticated, we cannot ensure that it will always work on either your end or our end.  If the connection with a video visit is poor, the visit may have to be switched to a telephone visit.  With either a video or telephone visit, we are not always able to ensure that we have a secure connection.     I need to obtain your verbal consent now.   Are you willing to proceed with your visit today?    Linda Rhodes has provided verbal consent on 09/24/2021 for a virtual visit (video or telephone).   Mar Daring, PA-C   Date: 09/24/2021 1:08 PM   Virtual Visit via Video Note   I, Mar Daring, connected with  Linda Rhodes  ([id number], May 22, 1976) on 09/24/21 at  1:00 PM EST by a video-enabled telemedicine application and verified that I am speaking with the correct person using two identifiers.  Location: Patient: Virtual Visit Location Patient: Home Provider: Virtual Visit Location Provider: Home Office   I discussed the limitations of evaluation and management  by telemedicine and the availability of in person appointments. The patient expressed understanding and agreed to proceed.    History of Present Illness: Linda Rhodes is a 45 y.o. who identifies as a female who was assigned female at birth, and is being seen today for possible shingles and sciatic pain. Has had recent viral URI symptoms on 08/28/2021. Treated with medrol dose pak on 08/31/21. Has had sciatica on the right side previously. Most recently treated on 06/26/21. Has had shingles in the past in 2019.  Yesterday she slipped on her porch while it was raining. She did not fall all the way down, but tweaked her back and re-aggravated her sciatica on the right side. Then this morning she awoke with pain on her left chest wall and saw vesicular lesions starting to come in on her left flank.   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    Adhesive [Tape] Rash    And skin tears from stronger tapes   Medications:  Current Outpatient Medications:    cyclobenzaprine (FLEXERIL) 10 MG tablet, Take 0.5-1 tablets (5-10 mg total) by mouth 3 (three) times daily as needed for muscle spasms., Disp: 30 tablet, Rfl: 0   methylPREDNISolone (MEDROL DOSEPAK) 4 MG TBPK tablet, 6 day taper; take as directed on package instructions, Disp: 21 tablet, Rfl: 0   valACYclovir (VALTREX) 1000 MG tablet, Take 1 tablet (1,000 mg total) by mouth 3  (three) times daily for 7 days., Disp: 21 tablet, Rfl: 0   amLODipine (NORVASC) 10 MG tablet, Take 1 tablet (10 mg total) by mouth daily., Disp: 90 tablet, Rfl: 3   gabapentin (NEURONTIN) 300 MG capsule, Take 1 capsule by mouth at bedtime., Disp: , Rfl:    metoprolol succinate (TOPROL-XL) 50 MG 24 hr tablet, Take 1 tablet (50 mg total) by mouth at bedtime. Take with or immediately following a meal., Disp: 30 tablet, Rfl: 1   spironolactone (ALDACTONE) 100 MG tablet, Take 1 tablet (100 mg total) by mouth 3 (three) times daily., Disp: 30 tablet, Rfl: 1   varenicline (CHANTIX PAK) 0.5 MG X 11 & 1 MG X 42 tablet, See admin instructions., Disp: , Rfl:   Observations/Objective: Patient is well-developed, well-nourished in no acute distress.  Resting comfortably at home.  Head is normocephalic, atraumatic.  No labored breathing.  Speech is clear and coherent with logical content.  Patient is alert and oriented at baseline.    Assessment and Plan: 1. Herpes zoster without complication - valACYclovir (VALTREX) 1000 MG tablet; Take 1 tablet (1,000 mg total) by mouth 3 (three) times daily for 7 days.  Dispense: 21 tablet; Refill: 0  2. Sciatica of right side - cyclobenzaprine (FLEXERIL) 10 MG tablet; Take 0.5-1 tablets (5-10 mg total) by mouth 3 (three) times daily as needed for muscle spasms.  Dispense: 30 tablet; Refill: 0 - methylPREDNISolone (MEDROL DOSEPAK) 4 MG TBPK tablet; 6 day taper; take as directed on package instructions  Dispense: 21 tablet; Refill: 0  - Medrol and flexeril for sciatica - Valtrex for shingles - Isolation precautions with shingles discussed - Heat for back - Exercises and light stretches - Seek in person evaluation if symptoms worsen or fail to improve  Follow Up Instructions: I discussed the assessment and treatment plan with the patient. The patient was provided an opportunity to ask questions and all were answered. The patient agreed with the plan and demonstrated  an understanding of the instructions.  A copy of instructions were sent to the patient via MyChart unless otherwise noted below.    The patient was advised to call back or seek an in-person evaluation if the symptoms worsen or if the condition fails to improve as anticipated.  Time:  I spent 11 minutes with the patient via telehealth technology discussing the above problems/concerns.    Mar Daring, PA-C

## 2021-11-04 ENCOUNTER — Other Ambulatory Visit: Payer: Self-pay

## 2021-11-04 ENCOUNTER — Ambulatory Visit
Admission: RE | Admit: 2021-11-04 | Discharge: 2021-11-04 | Disposition: A | Discharge status: Home / Self Care | Payer: BLUE CROSS/BLUE SHIELD | Source: Ambulatory Visit | Attending: Emergency Medicine | Admitting: Emergency Medicine

## 2021-11-04 VITALS — BP 131/88 | HR 88 | Temp 98.8°F | Resp 18 | Ht 68.0 in | Wt 135.0 lb

## 2021-11-04 DIAGNOSIS — R1031 Right lower quadrant pain: Secondary | ICD-10-CM | POA: Diagnosis not present

## 2021-11-04 NOTE — ED Provider Notes (Signed)
[facility]    CSN: [id number] Arrival date & time: 11/04/21  0806      History   Chief Complaint Chief Complaint  Patient presents with   Abdominal Pain    Appt    HPI Linda Rhodes is a 46 y.o. female.   HPI  46 year old female here for evaluation of abdominal pain.  Patient reports that she is experiencing abdominal pain for the last 2 days that is associated with nausea, vomiting on the first day, and decreased appetite.  She states that her pain is in her mid lower abdomen and she states it feels like when she had an ovarian cyst.  She has had a hysterectomy but she still has her ovaries.  She denies any fever, painful urination, urinary urgency or frequency, or any vaginal discharge.  Past Medical History:  Diagnosis Date   Abscessed tooth 09/24/2016   right lower tooth removed.   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[place]) 10/06/2018   Herpes zoster without complication 38/46/6599   Elevated blood pressure reading 07/18/2018   Radiculopathy of lumbar region 12/15/2017   Status post lumbar laminectomy 12/15/2017   Intractable chronic migraine without aura and without status migrainosus 08/04/2016   S/P hysterectomy 05/06/2015    Past Surgical History:  Procedure Laterality Date   ABDOMINAL HYSTERECTOMY     BACK SURGERY  2016   [facility] - ruptured L4-5   CESAREAN SECTION     CHOLECYSTECTOMY     CYSTOSCOPY N/A 05/06/2015   Procedure: CYSTOSCOPY;  Surgeon: Aletha Halim, MD;  Location: [facility];  Service:  Gynecology;  Laterality: N/A;   ESOPHAGOGASTRODUODENOSCOPY (EGD) WITH PROPOFOL N/A 04/13/2018   Procedure: ESOPHAGOGASTRODUODENOSCOPY (EGD) WITH PROPOFOL;  Surgeon: Lin Landsman, MD;  Location: [facility];  Service: Gastroenterology;  Laterality: N/A;   ETHMOIDECTOMY Right 11/25/2015   Procedure:  ANTERIOR ETHMOIDECTOMY;  Surgeon: Carloyn Manner, MD;  Location: [place];  Service: ENT;  Laterality: Right;   FRONTAL SINUS EXPLORATION Right 11/25/2015   Procedure: FRONTAL SINUSOTOMY;  Surgeon: Carloyn Manner, MD;  Location: [place];  Service: ENT;  Laterality: Right;   IMAGE GUIDED SINUS SURGERY N/A 11/25/2015   Procedure: IMAGE GUIDED SINUS SURGERY WITH BALLOON;  Surgeon: Carloyn Manner, MD;  Location: [place];  Service: ENT;  Laterality: N/A;   LAPAROSCOPIC HYSTERECTOMY N/A 05/06/2015   Procedure: HYSTERECTOMY TOTAL LAPAROSCOPIC;  Surgeon: Aletha Halim, MD;  Location: [facility];  Service: Gynecology;  Laterality: N/A;   LUMBAR LAMINECTOMY/DECOMPRESSION MICRODISCECTOMY N/A 10/04/2016   Procedure: right minimally invasive L5 S1 microdiscectomy;  Surgeon: Bayard Hugger, MD;  Location: [facility];  Service: Neurosurgery;  Laterality: N/A;   MAXILLARY ANTROSTOMY Right 11/25/2015   Procedure: MAXILLARY ANTROSTOMY WITH TISSUE REMOVAL;  Surgeon: Carloyn Manner, MD;  Location: [place];  Service: ENT;  Laterality: Right;   WISDOM TOOTH EXTRACTION  10/2115   3 teeth removed.    OB History     Gravida  0  Para  0   Term  0   Preterm  0   AB  0   Living  0      SAB  0   IAB  0   Ectopic  0   Multiple  0   Live Births  0            Home Medications    Prior to Admission medications   Medication Sig Start Date End Date Taking? Authorizing Provider  amLODipine (NORVASC) 10 MG tablet Take 1 tablet (10 mg total) by mouth daily. 09/18/19  Yes Hubbard Hartshorn, FNP  cyclobenzaprine (FLEXERIL) 10 MG tablet Take 0.5-1 tablets (5-10  mg total) by mouth 3 (three) times daily as needed for muscle spasms. 09/24/21   Mar Daring, PA-C  gabapentin (NEURONTIN) 300 MG capsule Take 1 capsule by mouth at bedtime. 02/03/21   [provider]  methylPREDNISolone (MEDROL DOSEPAK) 4 MG TBPK tablet 6 day taper; take as directed on package instructions 09/24/21   Mar Daring, PA-C  metoprolol succinate (TOPROL-XL) 50 MG 24 hr tablet Take 1 tablet (50 mg total) by mouth at bedtime. Take with or immediately following a meal. 04/12/20   Melynda Ripple, MD  spironolactone (ALDACTONE) 100 MG tablet Take 1 tablet (100 mg total) by mouth 3 (three) times daily. 04/12/20   Melynda Ripple, MD  varenicline (CHANTIX PAK) 0.5 MG X 11 & 1 MG X 42 tablet See admin instructions. 01/10/17   [provider]  escitalopram (LEXAPRO) 20 MG tablet TAKE 1 TABLET BY MOUTH EVERY DAY 12/13/19 11/14/20  Hubbard Hartshorn, FNP  famotidine (PEPCID) 20 MG tablet Take 1 tablet (20 mg total) by mouth 2 (two) times daily. 03/25/18 08/19/19  Carrie Mew, MD  promethazine (PHENERGAN) 25 MG tablet Take 1 tablet (25 mg total) by mouth every 6 (six) hours as needed for nausea or vomiting. 12/29/19 11/14/20  Amyot, Nicholes Stairs, NP    Family History Family History  Problem Relation Age of Onset   Diabetes Mother    Hypertension Mother    Other Mother        Covid death   Heart disease Mother    Kidney disease Mother        stage 17 renal failure   Diabetes Father    Hypertension Father    Stroke Father    Heart attack Father    Colon cancer Paternal Grandmother 43   Breast cancer Maternal Aunt 56   Breast cancer Paternal Aunt 96    Social History Social History   Tobacco Use   Smoking status: Former    Packs/day: 0.50    Years: 7.00    Pack years: 3.50    Types: Cigarettes    Quit date: 10/19/2015    Years since quitting: 6.0   Smokeless tobacco: Never  Vaping Use   Vaping Use: Never used  Substance Use Topics   Alcohol use: Yes     Comment: socially - 1x/mo   Drug use: No     Allergies   Aspirin and Adhesive [tape]   Review of Systems Review of Systems  Constitutional:  Positive for appetite change. Negative for activity change and fever.  Gastrointestinal:  Positive for abdominal pain, nausea and vomiting.  Skin:  Negative for rash.  Psychiatric/Behavioral: Negative.      Physical Exam Triage Vital Signs ED Triage Vitals  Enc Vitals Group     BP 11/04/21 0820 131/88     Pulse  Rate 11/04/21 0820 88     Resp 11/04/21 0820 18     Temp 11/04/21 0820 98.8 F (37.1 C)     Temp Source 11/04/21 0820 Oral     SpO2 11/04/21 0820 97 %     Weight 11/04/21 0820 135 lb (61.2 kg)     Height 11/04/21 0820 5\' 8"  (1.727 m)     Head Circumference --      Peak Flow --      Pain Score 11/04/21 0819 8     Pain Loc --      Pain Edu? --      Excl. in [place]? --    No data found.  Updated Vital Signs BP 131/88 (BP Location: Left Arm)    Pulse 88    Temp 98.8 F (37.1 C) (Oral)    Resp 18    Ht 5\' 8"  (1.727 m)    Wt 135 lb (61.2 kg)    LMP  (LMP Unknown)    SpO2 97%    BMI 20.53 kg/m   Visual Acuity Right Eye Distance:   Left Eye Distance:   Bilateral Distance:    Right Eye Near:   Left Eye Near:    Bilateral Near:     Physical Exam Vitals and nursing note reviewed.  Constitutional:      General: She is in acute distress.     Appearance: Normal appearance.  HENT:     Head: Normocephalic and atraumatic.  Cardiovascular:     Rate and Rhythm: Normal rate and regular rhythm.     Pulses: Normal pulses.     Heart sounds: Normal heart sounds. No murmur heard.   No friction rub. No gallop.  Pulmonary:     Effort: Pulmonary effort is normal.     Breath sounds: Normal breath sounds. No wheezing, rhonchi or rales.  Abdominal:     General: Abdomen is flat.     Palpations: Abdomen is soft.     Tenderness: There is abdominal tenderness. There is guarding and rebound. There is no right CVA tenderness or left CVA  tenderness.  Skin:    General: Skin is warm and dry.     Capillary Refill: Capillary refill takes less than 2 seconds.     Findings: No erythema or rash.  Neurological:     General: No focal deficit present.     Mental Status: She is alert and oriented to person, place, and time.  Psychiatric:        Mood and Affect: Mood normal.        Behavior: Behavior normal.        Thought Content: Thought content normal.        Judgment: Judgment normal.     UC Treatments / Results  Labs (all labs ordered are listed, but only abnormal results are displayed) Labs Reviewed  URINALYSIS, COMPLETE (UACMP) WITH MICROSCOPIC    EKG   Radiology No results found.  Procedures Procedures (including critical care time)  Medications Ordered in UC Medications - No data to display  Initial Impression / Assessment and Plan / UC Course  I have reviewed the triage vital signs and the nursing notes.  Pertinent labs & imaging results that were available during my care of the patient were reviewed by me and considered in my medical decision making (see chart for details).  Patient is a 29 old female who appears to be in a moderate degree of pain presenting for evaluation of abdominal pain  that has been ongoing for the past 2 days and has been associated with ongoing nausea, 1 day of vomiting, and a decreased appetite.  It is difficult for her to sit still due to the pain and she is holding her lower abdomen with her right arm.  She has not had a fever and she denies urinary complaints or vaginal discharge.  She has had a hysterectomy but still retains her ovaries.  She states that her pain feels like when she had an ovarian cyst in the past.  On physical exam patient has a benign cardiopulmonary exam with clear lung sounds in all fields.  No CVA tenderness on exam.  Abdomen is flat and soft.  Patient does have mild epigastric tenderness but she has significant right lower quadrant tenderness with guarding and  rebound.  She specifically has tenderness over McBurney's point.  There is a concern that patient has appendicitis despite lack of fever.  I discussed that she would best be served by being evaluated in the emergency department and she states that she would like to go to [facility].  Her vital signs are stable and I believe she is safe to go via POV.  She has called her son to transport her to the hospital.   Final Clinical Impressions(s) / UC Diagnoses   Final diagnoses:  Right lower quadrant abdominal pain     Discharge Instructions      Please go to the ER for evaluation of your abdominal pain as there is concern this might be your appendix.      ED Prescriptions   None    PDMP not reviewed this encounter.   Margarette Canada, NP 11/04/21 [phone]

## 2021-11-04 NOTE — ED Triage Notes (Addendum)
Patient is here for "Abd Pain" last 2 days. Mid-Lower Abd area. Some nausea, no vomiting "now". Vomiting "some first day". Stools "normal". "Feels like the cysts I have on my ovaries". No dysuria. No urinary frequency/urgency. No concerns with possible pregnancy.

## 2021-11-04 NOTE — Discharge Instructions (Signed)
Please go to the ER for evaluation of your abdominal pain as there is concern this might be your appendix.

## 2022-05-18 ENCOUNTER — Ambulatory Visit
Admission: RE | Admit: 2022-05-18 | Discharge: 2022-05-18 | Disposition: A | Discharge status: Home / Self Care | Payer: Self-pay | Source: Ambulatory Visit | Attending: Family Medicine | Admitting: Family Medicine

## 2022-05-18 ENCOUNTER — Other Ambulatory Visit: Payer: Self-pay

## 2022-05-18 VITALS — BP 160/116 | HR 91 | Temp 98.8°F | Resp 16 | Ht 68.0 in | Wt 134.9 lb

## 2022-05-18 DIAGNOSIS — M545 Low back pain, unspecified: Secondary | ICD-10-CM

## 2022-05-18 DIAGNOSIS — M5431 Sciatica, right side: Secondary | ICD-10-CM

## 2022-05-18 DIAGNOSIS — W19XXXA Unspecified fall, initial encounter: Secondary | ICD-10-CM

## 2022-05-18 DIAGNOSIS — H66001 Acute suppurative otitis media without spontaneous rupture of ear drum, right ear: Secondary | ICD-10-CM

## 2022-05-18 MED ORDER — CYCLOBENZAPRINE HCL 10 MG PO TABS
10.0000 mg | ORAL_TABLET | Freq: Three times a day (TID) | ORAL | 0 refills | Status: DC | PRN
Start: 2022-05-18 — End: 2022-07-28

## 2022-05-18 MED ORDER — AMOXICILLIN 875 MG PO TABS: 875.0000 mg | ORAL_TABLET | Freq: Two times a day (BID) | ORAL | 0 refills | Status: AC

## 2022-05-18 NOTE — Discharge Instructions (Addendum)
Stop by the pharmacy to pick up your prescriptions.  Follow up with your primary care provider as needed.  

## 2022-05-18 NOTE — ED Triage Notes (Signed)
Pt c/o bilateral ear pain right greater than left. Started about a month ago. She states she fell yesterday and has been having right lower back pain. She has h/o back injuries and surgeries.

## 2022-05-18 NOTE — ED Provider Notes (Signed)
[facility]    CSN: [id number] Arrival date & time: 05/18/22  1358      History   Chief Complaint Chief Complaint  Patient presents with   Back Pain    earache or infection - Entered by patient   Otalgia    R>L    HPI Linda Rhodes is a 46 y.o. female.   HPI  Back Pain Trying to run into the house to escape the rain and slipped in the mud. She landed on her butt. Has history of spinal surgeries.  She has been taking Tylenol for low back pain.  Pain is constant. CBD oil that is mentholated helps for awhile. Coughing and sneezing makes the pain worse. Dr Aris Lot, neurosurgery, performed her three back surgeries, last about 4 years ago. Denies vomiting, abdominal pain, chest pain. No bowel or bladder incontinence.   Ear Pain Going on for "a long time." She went to [facility] in June. The pain got better but then returned.  Has some muffled hearing. She put some ear drops in her hears but its not working anymore. Has throbbing and "ocean sound" in her ears.  No cough or nasal congestion, fever.     Past Medical History:  Diagnosis Date   Abscessed tooth 09/24/2016   right lower tooth removed.   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[place]) 10/06/2018   Herpes zoster without complication 50/93/2671   Elevated blood pressure reading 07/18/2018   Radiculopathy of lumbar region 12/15/2017   Status post lumbar laminectomy 12/15/2017   Intractable chronic migraine without aura and without status migrainosus 08/04/2016   S/P hysterectomy 05/06/2015     Past Surgical History:  Procedure Laterality Date   ABDOMINAL HYSTERECTOMY     BACK SURGERY  2016   [facility] - ruptured L4-5   CESAREAN SECTION     CHOLECYSTECTOMY     CYSTOSCOPY N/A 05/06/2015   Procedure: CYSTOSCOPY;  Surgeon: Aletha Halim, MD;  Location: [facility];  Service: Gynecology;  Laterality: N/A;   ESOPHAGOGASTRODUODENOSCOPY (EGD) WITH PROPOFOL N/A 04/13/2018   Procedure: ESOPHAGOGASTRODUODENOSCOPY (EGD) WITH PROPOFOL;  Surgeon: Lin Landsman, MD;  Location: [facility]COPY;  Service: Gastroenterology;  Laterality: N/A;   ETHMOIDECTOMY Right 11/25/2015   Procedure:  ANTERIOR ETHMOIDECTOMY;  Surgeon: Carloyn Manner, MD;  Location: [place];  Service: ENT;  Laterality: Right;   FRONTAL SINUS EXPLORATION Right 11/25/2015   Procedure: FRONTAL SINUSOTOMY;  Surgeon: Carloyn Manner, MD;  Location: [place];  Service: ENT;  Laterality: Right;   IMAGE GUIDED SINUS SURGERY N/A 11/25/2015   Procedure: IMAGE GUIDED SINUS SURGERY WITH BALLOON;  Surgeon: Carloyn Manner, MD;  Location: [place];  Service: ENT;  Laterality: N/A;   LAPAROSCOPIC HYSTERECTOMY N/A 05/06/2015   Procedure: HYSTERECTOMY TOTAL LAPAROSCOPIC;  Surgeon: Aletha Halim, MD;  Location: [facility];  Service: Gynecology;  Laterality: N/A;   LUMBAR LAMINECTOMY/DECOMPRESSION MICRODISCECTOMY N/A 10/04/2016   Procedure: right minimally invasive L5 S1 microdiscectomy;  Surgeon: Bayard Hugger,  MD;  Location: [facility];  Service: Neurosurgery;  Laterality: N/A;   MAXILLARY ANTROSTOMY Right 11/25/2015   Procedure: MAXILLARY ANTROSTOMY WITH TISSUE REMOVAL;  Surgeon: Carloyn Manner, MD;  Location: [place];  Service: ENT;  Laterality: Right;   WISDOM TOOTH EXTRACTION  10/2115   3 teeth removed.    OB History     Gravida  0   Para  0   Term  0   Preterm  0   AB  0   Living  0      SAB  0   IAB  0   Ectopic  0   Multiple  0   Live Births  0             Home Medications    Prior to Admission medications   Medication Sig Start Date End Date Taking? Authorizing Provider  amLODipine (NORVASC) 10 MG tablet Take 1 tablet (10 mg total) by mouth daily. 09/18/19  Yes Hubbard Hartshorn, FNP  amoxicillin (AMOXIL) 875 MG tablet Take 1 tablet (875 mg total) by mouth 2 (two) times daily for 10 days. 05/18/22 05/28/22 Yes Ahmyah Gidley, DO  gabapentin (NEURONTIN) 300 MG capsule Take 1 capsule by mouth at bedtime. 02/03/21  Yes [provider]  metoprolol succinate (TOPROL-XL) 50 MG 24 hr tablet Take 1 tablet (50 mg total) by mouth at bedtime. Take with or immediately following a meal. 04/12/20  Yes Melynda Ripple, MD  spironolactone (ALDACTONE) 100 MG tablet Take 1 tablet (100 mg total) by mouth 3 (three) times daily. 04/12/20  Yes Melynda Ripple, MD  cyclobenzaprine (FLEXERIL) 10 MG tablet Take 1 tablet (10 mg total) by mouth 3 (three) times daily as needed for muscle spasms. 05/18/22   Lyndee Hensen, DO  methylPREDNISolone (MEDROL DOSEPAK) 4 MG TBPK tablet 6 day taper; take as directed on package instructions 09/24/21   Mar Daring, PA-C  varenicline (CHANTIX PAK) 0.5 MG X 11 & 1 MG X 42 tablet See admin instructions. 01/10/17   [provider]  escitalopram (LEXAPRO) 20 MG tablet TAKE 1 TABLET BY MOUTH EVERY DAY 12/13/19 11/14/20  Hubbard Hartshorn, FNP  famotidine (PEPCID) 20 MG tablet Take 1 tablet (20 mg total) by mouth 2 (two) times daily. 03/25/18 08/19/19  Carrie Mew, MD  promethazine (PHENERGAN) 25 MG tablet Take 1 tablet (25 mg total) by mouth every 6 (six) hours as needed for nausea or vomiting. 12/29/19 11/14/20  Amyot, Nicholes Stairs, NP    Family History Family History  Problem Relation Age of Onset   Diabetes Mother    Hypertension Mother    Other Mother        Covid death   Heart disease Mother    Kidney disease Mother        stage 47 renal failure   Diabetes Father    Hypertension Father    Stroke Father     Heart attack Father    Colon cancer Paternal Grandmother 80   Breast cancer Maternal Aunt 56   Breast cancer Paternal Aunt 74    Social History Social History   Tobacco Use   Smoking status: Former    Packs/day: 0.50    Years: 7.00    Total pack years: 3.50    Types: Cigarettes    Quit date: 10/19/2015    Years since quitting: 6.5   Smokeless tobacco: Never  Vaping Use   Vaping Use: Never used  Substance Use Topics   Alcohol use:  Yes    Comment: socially - 1x/mo   Drug use: No     Allergies   Aspirin and Adhesive [tape]   Review of Systems Review of Systems :negative unless otherwise stated in HPI.      Physical Exam Triage Vital Signs ED Triage Vitals  Enc Vitals Group     BP 05/18/22 1410 (!) 160/116     Pulse Rate 05/18/22 1410 91     Resp 05/18/22 1410 16     Temp 05/18/22 1410 98.8 F (37.1 C)     Temp Source 05/18/22 1410 Oral     SpO2 05/18/22 1410 99 %     Weight 05/18/22 1408 134 lb 14.7 oz (61.2 kg)     Height 05/18/22 1408 '5\' 8"'$  (1.727 m)     Head Circumference --      Peak Flow --      Pain Score 05/18/22 1407 6     Pain Loc --      Pain Edu? --      Excl. in [place]? --    No data found.  Updated Vital Signs BP (!) 160/116 (BP Location: Left Arm) Comment: pt has been out of metoprolol for about 2 weeks  Pulse 91   Temp 98.8 F (37.1 C) (Oral)   Resp 16   Ht '5\' 8"'$  (1.727 m)   Wt 61.2 kg   LMP  (LMP Unknown)   SpO2 99%   BMI 20.51 kg/m   Visual Acuity Right Eye Distance:   Left Eye Distance:   Bilateral Distance:    Right Eye Near:   Left Eye Near:    Bilateral Near:     Physical Exam   GEN: well appearing female in no acute distress  HENT: right TM opaque and erythematous, left TM normal  CVS: well perfused  RESP: speaking in full sentences without pause, no respiratory distress  MSK Lumbar spine: - Inspection: no gross deformity or asymmetry, swelling or ecchymosis. No skin changes, multiple vertical surgical scars  -  Palpation: No TTP over the spinous processes or SI joints bilaterally, + paraspinal muscles right >left, hypertonicity of bilateral paraspinal muscles  - ROM: limited ROM of the lumbar spine in flexion and extension due to pain - Strength: 5/5 strength of lower extremity in L4-S1 nerve root distributions b/l - Neuro: sensation intact in the L4-S1 nerve root distribution b/l, strength 5/5 bilateral LE   UC Treatments / Results  Labs (all labs ordered are listed, but only abnormal results are displayed) Labs Reviewed - No data to display  EKG   Radiology No results found.  Procedures Procedures (including critical care time)  Medications Ordered in UC Medications - No data to display  Initial Impression / Assessment and Plan / UC Course  I have reviewed the triage vital signs and the nursing notes.  Pertinent labs & imaging results that were available during my care of the patient were reviewed by me and considered in my medical decision making (see chart for details).  AOM: Pt with intermittent ear pain with exam concerning for acute otitis media. She is well appearing and afebrile. Treat with amoxicillin.   Fall with acute on chronic low back pain: Pt with history of multiple spinal surgeries presents for low back pain after a fall yesterday.  Exam concerning for lumbar strain. Doubt fracture or at this time imaging is deferred. Not likely cauda equina. Treat with muscle relaxer and OTC analgesics. Follow up with PCP or neurosurgery  if pain persists. ED precautions and return precautions provided.       Final Clinical Impressions(s) / UC Diagnoses   Final diagnoses:  Non-recurrent acute suppurative otitis media of right ear without spontaneous rupture of tympanic membrane  Acute bilateral low back pain, unspecified whether sciatica present  Fall, initial encounter     Discharge Instructions      Stop by the pharmacy to pick up your prescriptions.  Follow up with your  primary care provider as needed.      ED Prescriptions     Medication Sig Dispense Auth. Provider   cyclobenzaprine (FLEXERIL) 10 MG tablet Take 1 tablet (10 mg total) by mouth 3 (three) times daily as needed for muscle spasms. 30 tablet Krysta Bloomfield, DO   amoxicillin (AMOXIL) 875 MG tablet Take 1 tablet (875 mg total) by mouth 2 (two) times daily for 10 days. 20 tablet Lyndee Hensen, DO      PDMP not reviewed this encounter.   Lyndee Hensen, DO 05/18/22 2033

## 2022-07-28 ENCOUNTER — Encounter: Payer: Self-pay | Admitting: Internal Medicine

## 2022-07-28 ENCOUNTER — Ambulatory Visit (INDEPENDENT_AMBULATORY_CARE_PROVIDER_SITE_OTHER): Payer: Self-pay | Admitting: Internal Medicine

## 2022-07-28 VITALS — BP 160/110 | HR 100 | Temp 98.3°F | Resp 16 | Ht 70.0 in | Wt 142.8 lb

## 2022-07-28 DIAGNOSIS — B029 Zoster without complications: Secondary | ICD-10-CM

## 2022-07-28 DIAGNOSIS — Z13220 Encounter for screening for lipoid disorders: Secondary | ICD-10-CM

## 2022-07-28 DIAGNOSIS — I10 Essential (primary) hypertension: Secondary | ICD-10-CM

## 2022-07-28 MED ORDER — METOPROLOL SUCCINATE ER 50 MG PO TB24: 50.0000 mg | ORAL_TABLET | Freq: Every day | ORAL | 1 refills | Status: AC

## 2022-07-28 MED ORDER — VALACYCLOVIR HCL 1 G PO TABS: 1000.0000 mg | ORAL_TABLET | Freq: Three times a day (TID) | ORAL | 0 refills | Status: AC

## 2022-07-28 MED ORDER — AMLODIPINE BESYLATE 10 MG PO TABS: 10.0000 mg | ORAL_TABLET | Freq: Every day | ORAL | 1 refills | Status: DC

## 2022-07-28 NOTE — Progress Notes (Signed)
Established Patient Office Visit  Subjective   Patient ID: Linda Rhodes, female    DOB: 19-Apr-1976  Age: 46 y.o. MRN: [id number]  Chief Complaint  Patient presents with   Hypertension   Rash    HPI  Patient is here to follow up on blood pressure and new rash. She has not been seen here for sometime. She had been on medications for blood pressure years ago but ran out of medications and was lost to follow up. 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   Hypertension: -Blood pressure high for years, had been on Amlodipine 10 mg, Metoprolol 50 mg in the past  -Medications: Nothing currently  -Checking BP at home (average): not checking  -Denies any SOB, CP, vision changes, LE edema or symptoms of hypotension. Has a headache currently   RASH Duration:  2 weeks  Location:  chest wall    Itching: yes Burning: yes Redness: no Oozing: no Scaling: no Blisters: yes Painful: yes Fevers: no  Health Maintenance: -Blood work due -Mammogram 6/22    Review of Systems  Constitutional:  Negative for chills and fever.  Eyes:  Negative for blurred vision.  Respiratory:  Negative for shortness of breath.   Cardiovascular:  Negative for chest pain and leg swelling.  Skin:  Positive for rash.  Neurological:  Positive for headaches.      Objective:     BP (!) 160/110   Pulse 100   Temp 98.3 F (36.8 C)   Resp 16   Ht '5\' 10"'$  (1.778 m)   Wt 142 lb 12.8 oz (64.8 kg)   LMP  (LMP Unknown)   SpO2 98%   BMI 20.49 kg/m  BP Readings from Last 3 Encounters:  07/28/22 (!) 160/110  05/18/22 (!) 160/116  11/04/21 131/88   Wt Readings from Last 3 Encounters:  07/28/22 142 lb 12.8 oz (64.8 kg)  05/18/22 134 lb 14.7 oz (61.2 kg)  11/04/21 135 lb (61.2 kg)      Physical Exam Constitutional:      Appearance: Normal appearance.  HENT:     Head: Normocephalic and atraumatic.     Right Ear: Tympanic  membrane, ear canal and external ear normal.     Left Ear: Tympanic membrane, ear canal and external ear normal.  Eyes:     Conjunctiva/sclera: Conjunctivae normal.  Cardiovascular:     Rate and Rhythm: Normal rate and regular rhythm.  Pulmonary:     Effort: Pulmonary effort is normal.     Breath sounds: Normal breath sounds.  Musculoskeletal:     Right lower leg: No edema.     Left lower leg: No edema.  Skin:    General: Skin is warm and dry.     Findings: Rash present.     Comments: Vesicular rash on chest wall, starting to crust over  Neurological:     General: No focal deficit present.     Mental Status: She is alert. Mental status is at baseline.  Psychiatric:        Mood and Affect: Mood normal.        Behavior: Behavior normal.      No results found for any visits on 07/28/22.  Last CBC Lab Results  Component Value Date   WBC 5.9 08/18/2018   HGB 14.8 08/18/2018   HCT 42.1 08/18/2018   MCV 92.1 08/18/2018   MCH 32.4 08/18/2018  RDW 13.1 08/18/2018   PLT 237 00/93/8182   Last metabolic panel Lab Results  Component Value Date   GLUCOSE 98 08/06/2019   NA 140 08/06/2019   K 3.7 08/06/2019   CL 104 08/06/2019   CO2 27 08/06/2019   BUN 6 (L) 08/06/2019   CREATININE 0.79 08/06/2019   GFRNONAA 93 08/06/2019   CALCIUM 9.2 08/06/2019   PROT 7.3 09/06/2018   ALBUMIN 4.4 03/25/2018   BILITOT 0.8 09/06/2018   ALKPHOS 71 03/25/2018   AST 15 09/06/2018   ALT 7 09/06/2018   ANIONGAP 11 08/18/2018   Last lipids Lab Results  Component Value Date   CHOL 127 09/06/2018   HDL 42 (L) 09/06/2018   LDLCALC 61 09/06/2018   TRIG 154 (H) 09/06/2018   CHOLHDL 3.0 09/06/2018   Last hemoglobin A1c No results found for: "HGBA1C" Last thyroid functions No results found for: "TSH", "T3TOTAL", "T4TOTAL", "THYROIDAB" Last vitamin D No results found for: "25OHVITD2", "25OHVITD3", "VD25OH" Last vitamin B12 and Folate No results found for: "VITAMINB12", "FOLATE"     The ASCVD Risk score (Arnett DK, et al., 2019) failed to calculate for the following reasons:   Cannot find a previous HDL lab   Cannot find a previous total cholesterol lab    Assessment & Plan:   1. Essential hypertension: Blood pressure elevated today, patient complaining of headache.  We will restart her amlodipine 10 mg and metoprolol 50 mg daily.  Patient does have a blood pressure cuff at home, she will monitor her blood pressure and bring these readings to her next visit.  Blood work due, obtain CBC and CMP today.  Follow-up in 1 month for recheck.  - CBC w/Diff/Platelet - COMPLETE METABOLIC PANEL WITH GFR - amLODipine (NORVASC) 10 MG tablet; Take 1 tablet (10 mg total) by mouth daily.  Dispense: 90 tablet; Refill: 1 - metoprolol succinate (TOPROL-XL) 50 MG 24 hr tablet; Take 1 tablet (50 mg total) by mouth at bedtime. Take with or immediately following a meal.  Dispense: 90 tablet; Refill: 1  2. Lipid screening: Due for lipid screening today.  - Lipid Profile  3. Herpes zoster without complication: Rash appears to be healing, however patient is still having some pain associated with it, will treat with Valtrex 1 g 3 times daily for 7 days.  - valACYclovir (VALTREX) 1000 MG tablet; Take 1 tablet (1,000 mg total) by mouth 3 (three) times daily for 7 days.  Dispense: 21 tablet; Refill: 0  Return in about 4 weeks (around 08/25/2022).    Teodora Medici, DO

## 2022-07-28 NOTE — Patient Instructions (Addendum)
It was great seeing you today!  Plan discussed at today's visit: -Blood work ordered today, results will be uploaded to MyChart.  -Restart Amlodipine 10 mg, Metoprolol 50 mg  -Continue to monitor blood pressure at home -Valtrex for rash sent to pharmacy   Follow up in: 1 month   Take care and let us know if you have any questions or concerns prior to your next visit.  Dr. Rosana Berger

## 2022-07-29 LAB — CBC WITH DIFFERENTIAL/PLATELET
Absolute Monocytes: 470 cells/uL (ref 200–950)
Basophils Absolute: 38 cells/uL (ref 0–200)
Basophils Relative: 0.7 %
Eosinophils Absolute: 70 cells/uL (ref 15–500)
Eosinophils Relative: 1.3 %
HCT: 44.4 % (ref 35.0–45.0)
Hemoglobin: 15.5 g/dL (ref 11.7–15.5)
Lymphs Abs: 1782 cells/uL (ref 850–3900)
MCH: 32.2 pg (ref 27.0–33.0)
MCHC: 34.9 g/dL (ref 32.0–36.0)
MCV: 92.3 fL (ref 80.0–100.0)
MPV: 12.6 fL — ABNORMAL HIGH (ref 7.5–12.5)
Monocytes Relative: 8.7 %
Neutro Abs: 3040 cells/uL (ref 1500–7800)
Neutrophils Relative %: 56.3 %
Platelets: 267 10*3/uL (ref 140–400)
RBC: 4.81 10*6/uL (ref 3.80–5.10)
RDW: 13.1 % (ref 11.0–15.0)
Total Lymphocyte: 33 %
WBC: 5.4 10*3/uL (ref 3.8–10.8)

## 2022-07-29 LAB — LIPID PANEL
Cholesterol: 152 mg/dL (ref <200)
HDL: 40 mg/dL — ABNORMAL LOW (ref >=50)
LDL Cholesterol (Calc): 79 mg/dL (calc)
Non-HDL Cholesterol (Calc): 112 mg/dL (calc) (ref <130)
Total CHOL/HDL Ratio: 3.8 (calc) (ref <5.0)
Triglycerides: 237 mg/dL — ABNORMAL HIGH (ref <150)

## 2022-07-29 LAB — COMPLETE METABOLIC PANEL WITH GFR
AG Ratio: 1.4 (calc) (ref 1.0–2.5)
ALT: 5 U/L — ABNORMAL LOW (ref 6–29)
AST: 12 U/L (ref 10–30)
Albumin: 4.8 g/dL (ref 3.6–5.1)
Alkaline phosphatase (APISO): 80 U/L (ref 31–125)
BUN: 8 mg/dL (ref 7–25)
CO2: 28 mmol/L (ref 20–32)
Calcium: 9.8 mg/dL (ref 8.6–10.2)
Chloride: 101 mmol/L (ref 98–110)
Creat: 0.94 mg/dL (ref 0.50–0.99)
Globulin: 3.4 g/dL (calc) (ref 1.9–3.7)
Glucose, Bld: 97 mg/dL (ref 65–99)
Potassium: 3.7 mmol/L (ref 3.5–5.3)
Sodium: 140 mmol/L (ref 135–146)
Total Bilirubin: 0.9 mg/dL (ref 0.2–1.2)
Total Protein: 8.2 g/dL — ABNORMAL HIGH (ref 6.1–8.1)
eGFR: 77 mL/min/{1.73_m2} (ref >=60)

## 2022-08-02 ENCOUNTER — Encounter: Payer: Self-pay | Admitting: Internal Medicine

## 2022-08-03 NOTE — Progress Notes (Unsigned)
Established Patient Office Visit  Subjective   Patient ID: Linda Rhodes, female    DOB: January 30, 1976  Age: 46 y.o. MRN: [id number]  No chief complaint on file.   HPI  Patient is here to follow up on blood pressure and new rash. She has not been seen here for sometime. She had been on medications for blood pressure years ago but ran out of medications and was lost to follow up. 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   Hypertension: -Blood pressure high for years, had been on Amlodipine 10 mg, Metoprolol 50 mg in the past, restarted at LOV last week   -Medications: Amlodipine 10 mg, Metoprolol 50 mg -Checking BP at home (average): not checking  -Denies any SOB, CP, vision changes, LE edema or symptoms of hypotension. Has a headache currently   Health Maintenance: -Blood work due -Mammogram 6/22    Review of Systems  Constitutional:  Negative for chills and fever.  Eyes:  Negative for blurred vision.  Respiratory:  Negative for shortness of breath.   Cardiovascular:  Negative for chest pain and leg swelling.  Skin:  Positive for rash.  Neurological:  Positive for headaches.      Objective:     LMP  (LMP Unknown)  BP Readings from Last 3 Encounters:  07/28/22 (!) 160/110  05/18/22 (!) 160/116  11/04/21 131/88   Wt Readings from Last 3 Encounters:  07/28/22 142 lb 12.8 oz (64.8 kg)  05/18/22 134 lb 14.7 oz (61.2 kg)  11/04/21 135 lb (61.2 kg)      Physical Exam Constitutional:      Appearance: Normal appearance.  HENT:     Head: Normocephalic and atraumatic.     Right Ear: Tympanic membrane, ear canal and external ear normal.     Left Ear: Tympanic membrane, ear canal and external ear normal.  Eyes:     Conjunctiva/sclera: Conjunctivae normal.  Cardiovascular:     Rate and Rhythm: Normal rate and regular rhythm.  Pulmonary:     Effort: Pulmonary effort is normal.     Breath  sounds: Normal breath sounds.  Musculoskeletal:     Right lower leg: No edema.     Left lower leg: No edema.  Skin:    General: Skin is warm and dry.     Findings: Rash present.     Comments: Vesicular rash on chest wall, starting to crust over  Neurological:     General: No focal deficit present.     Mental Status: She is alert. Mental status is at baseline.  Psychiatric:        Mood and Affect: Mood normal.        Behavior: Behavior normal.      No results found for any visits on 08/04/22.  Last CBC Lab Results  Component Value Date   WBC 5.4 07/28/2022   HGB 15.5 07/28/2022   HCT 44.4 07/28/2022   MCV 92.3 07/28/2022   MCH 32.2 07/28/2022   RDW 13.1 07/28/2022   PLT 267 11/94/1740   Last metabolic panel Lab Results  Component Value Date   GLUCOSE 97 07/28/2022   NA 140 07/28/2022   K 3.7 07/28/2022   CL 101 07/28/2022   CO2 28 07/28/2022   BUN 8 07/28/2022   CREATININE 0.94 07/28/2022   GFRNONAA 93 08/06/2019   CALCIUM 9.8 07/28/2022   PROT 8.2 (H) 07/28/2022   ALBUMIN  4.4 03/25/2018   BILITOT 0.9 07/28/2022   ALKPHOS 71 03/25/2018   AST 12 07/28/2022   ALT 5 (L) 07/28/2022   ANIONGAP 11 08/18/2018   Last lipids Lab Results  Component Value Date   CHOL 152 07/28/2022   HDL 40 (L) 07/28/2022   LDLCALC 79 07/28/2022   TRIG 237 (H) 07/28/2022   CHOLHDL 3.8 07/28/2022   Last hemoglobin A1c No results found for: "HGBA1C" Last thyroid functions No results found for: "TSH", "T3TOTAL", "T4TOTAL", "THYROIDAB" Last vitamin D No results found for: "25OHVITD2", "25OHVITD3", "VD25OH" Last vitamin B12 and Folate No results found for: "VITAMINB12", "FOLATE"    The 10-year ASCVD risk score (Arnett DK, et al., 2019) is: 9%    Assessment & Plan:   1. Essential hypertension: Blood pressure elevated today, patient complaining of headache.  We will restart her amlodipine 10 mg and metoprolol 50 mg daily.  Patient does have a blood pressure cuff at home, she  will monitor her blood pressure and bring these readings to her next visit.  Blood work due, obtain CBC and CMP today.  Follow-up in 1 month for recheck.  - CBC w/Diff/Platelet - COMPLETE METABOLIC PANEL WITH GFR - amLODipine (NORVASC) 10 MG tablet; Take 1 tablet (10 mg total) by mouth daily.  Dispense: 90 tablet; Refill: 1 - metoprolol succinate (TOPROL-XL) 50 MG 24 hr tablet; Take 1 tablet (50 mg total) by mouth at bedtime. Take with or immediately following a meal.  Dispense: 90 tablet; Refill: 1  2. Lipid screening: Due for lipid screening today.  - Lipid Profile  3. Herpes zoster without complication: Rash appears to be healing, however patient is still having some pain associated with it, will treat with Valtrex 1 g 3 times daily for 7 days.  - valACYclovir (VALTREX) 1000 MG tablet; Take 1 tablet (1,000 mg total) by mouth 3 (three) times daily for 7 days.  Dispense: 21 tablet; Refill: 0  No follow-ups on file.    Teodora Medici, DO

## 2022-08-04 ENCOUNTER — Ambulatory Visit: Payer: Self-pay | Admitting: Internal Medicine

## 2022-08-04 ENCOUNTER — Encounter: Payer: Self-pay | Admitting: Internal Medicine

## 2022-08-04 VITALS — BP 118/80 | HR 84 | Temp 98.7°F | Resp 16 | Ht 70.0 in | Wt 144.8 lb

## 2022-08-04 DIAGNOSIS — I10 Essential (primary) hypertension: Secondary | ICD-10-CM

## 2022-08-04 DIAGNOSIS — E781 Pure hyperglyceridemia: Secondary | ICD-10-CM

## 2022-08-25 ENCOUNTER — Ambulatory Visit: Payer: Medicaid Other | Admitting: Internal Medicine

## 2022-11-03 NOTE — Progress Notes (Deleted)
Established Patient Office Visit  Subjective   Patient ID: Linda Rhodes, female    DOB: 26-Dec-1975  Age: 47 y.o. MRN: [id number]  No chief complaint on file.   HPI  Patient is here to follow up on chronic medical conditions.   Hypertension: -Blood pressure high for years, had been on Amlodipine 10 mg, Metoprolol 50 mg in the past, restarted at LOV last week   -Medications: Amlodipine 10 mg, Metoprolol 50 mg -Checking BP at home (average): highest 133, average 120's since she's been back on the meds -Denies any SOB, CP, vision changes, LE edema or symptoms of hypotension. Has a headache currently  -Diet: trying to reduce sodium   HLD: -Medications: Nothing  -Last lipid panel: Lipid Panel     Component Value Date/Time   CHOL 152 07/28/2022 0831   TRIG 237 (H) 07/28/2022 0831   HDL 40 (L) 07/28/2022 0831   CHOLHDL 3.8 07/28/2022 0831   LDLCALC 79 07/28/2022 0831   The 10-year ASCVD risk score (Arnett DK, et al., 2019) is: 2.1%   Values used to calculate the score:     Age: 63 years     Sex: Female     Is Non-Hispanic African American: Yes     Diabetic: No     Tobacco smoker: No     Systolic Blood Pressure: 123456 mmHg     Is BP treated: Yes     HDL Cholesterol: 40 mg/dL     Total Cholesterol: 152 mg/dL  Shingles Rash: -Resolving after starting the anti-viral  Health Maintenance: -Blood work UTD -Mammogram 6/22  Review of Systems  Constitutional:  Negative for chills and fever.  Eyes:  Negative for blurred vision.  Respiratory:  Negative for shortness of breath.   Cardiovascular:  Negative for chest pain and leg swelling.  Skin:  Negative for rash.  Neurological:  Negative for headaches.      Objective:     LMP  (LMP Unknown)  BP Readings from Last 3 Encounters:  08/04/22 118/80  07/28/22 (!) 160/110  05/18/22 (!) 160/116   Wt Readings from Last 3 Encounters:  08/04/22 144 lb 12.8 oz (65.7 kg)  07/28/22 142 lb 12.8 oz (64.8 kg)  05/18/22 134 lb 14.7  oz (61.2 kg)      Physical Exam Constitutional:      Appearance: Normal appearance.  HENT:     Head: Normocephalic and atraumatic.  Eyes:     Conjunctiva/sclera: Conjunctivae normal.  Cardiovascular:     Rate and Rhythm: Normal rate and regular rhythm.  Pulmonary:     Effort: Pulmonary effort is normal.     Breath sounds: Normal breath sounds.  Skin:    General: Skin is warm and dry.  Neurological:     General: No focal deficit present.     Mental Status: She is alert. Mental status is at baseline.  Psychiatric:        Mood and Affect: Mood normal.        Behavior: Behavior normal.      No results found for any visits on 11/04/22.  Last CBC Lab Results  Component Value Date   WBC 5.4 07/28/2022   HGB 15.5 07/28/2022   HCT 44.4 07/28/2022   MCV 92.3 07/28/2022   MCH 32.2 07/28/2022   RDW 13.1 07/28/2022   PLT 267 [id number]   Last metabolic panel Lab Results  Component Value Date   GLUCOSE 97 07/28/2022   NA 140 07/28/2022   K 3.7  07/28/2022   CL 101 07/28/2022   CO2 28 07/28/2022   BUN 8 07/28/2022   CREATININE 0.94 07/28/2022   GFRNONAA 93 08/06/2019   CALCIUM 9.8 07/28/2022   PROT 8.2 (H) 07/28/2022   ALBUMIN 4.4 03/25/2018   BILITOT 0.9 07/28/2022   ALKPHOS 71 03/25/2018   AST 12 07/28/2022   ALT 5 (L) 07/28/2022   ANIONGAP 11 08/18/2018   Last lipids Lab Results  Component Value Date   CHOL 152 07/28/2022   HDL 40 (L) 07/28/2022   LDLCALC 79 07/28/2022   TRIG 237 (H) 07/28/2022   CHOLHDL 3.8 07/28/2022   Last hemoglobin A1c No results found for: "HGBA1C" Last thyroid functions No results found for: "TSH", "T3TOTAL", "T4TOTAL", "THYROIDAB" Last vitamin D No results found for: "25OHVITD2", "25OHVITD3", "VD25OH" Last vitamin B12 and Folate No results found for: "VITAMINB12", "FOLATE"    The 10-year ASCVD risk score (Arnett DK, et al., 2019) is: 2.1%    Assessment & Plan:   1. Essential hypertension: Blood pressure much better  controlled today, average appropriate at home as well. Continue Amlodipine 10 mg, Metoprolol 50 mg daily. Continue to monitor at home, letter stating that she is medically optimizing for driving was given to the patient. Follow up here in 3 months.   2. Hypertriglyceridemia: Reviewed lipid panel with the patient, triglycerides elevated. Patient does eat a lot of candy, will work on decreasing sugars in the diet. Discussed how fish oil and omega 3 fatty acids can help  lower as well. Overall cardiovascular risk low. Plan to recheck again next year.    No follow-ups on file.    Teodora Medici, DO

## 2022-11-04 ENCOUNTER — Ambulatory Visit: Payer: Medicaid Other | Admitting: Internal Medicine

## 2022-11-19 ENCOUNTER — Other Ambulatory Visit: Payer: Self-pay | Admitting: Internal Medicine

## 2022-11-19 ENCOUNTER — Encounter: Payer: Self-pay | Admitting: Internal Medicine

## 2022-11-19 DIAGNOSIS — Z716 Tobacco abuse counseling: Secondary | ICD-10-CM

## 2022-11-19 MED ORDER — VARENICLINE TARTRATE (STARTER) 0.5 MG X 11 & 1 MG X 42 PO TBPK: ORAL_TABLET | ORAL | 0 refills | Status: AC

## 2023-08-09 ENCOUNTER — Ambulatory Visit (INDEPENDENT_AMBULATORY_CARE_PROVIDER_SITE_OTHER): Payer: BC Managed Care – PPO

## 2023-08-09 ENCOUNTER — Ambulatory Visit
Admission: RE | Admit: 2023-08-09 | Discharge: 2023-08-09 | Disposition: A | Discharge status: Home / Self Care | Payer: BC Managed Care – PPO | Source: Ambulatory Visit | Attending: Physician Assistant | Admitting: Physician Assistant

## 2023-08-09 VITALS — BP 140/100 | HR 98 | Temp 98.4°F | Resp 18

## 2023-08-09 DIAGNOSIS — M545 Low back pain, unspecified: Secondary | ICD-10-CM | POA: Diagnosis not present

## 2023-08-09 DIAGNOSIS — M5442 Lumbago with sciatica, left side: Secondary | ICD-10-CM | POA: Diagnosis not present

## 2023-08-09 DIAGNOSIS — S39012A Strain of muscle, fascia and tendon of lower back, initial encounter: Secondary | ICD-10-CM

## 2023-08-09 DIAGNOSIS — Y990 Civilian activity done for income or pay: Secondary | ICD-10-CM | POA: Diagnosis not present

## 2023-08-09 DIAGNOSIS — S8992XA Unspecified injury of left lower leg, initial encounter: Secondary | ICD-10-CM

## 2023-08-09 MED ORDER — PREDNISONE 20 MG PO TABS: ORAL_TABLET | ORAL | 0 refills | Status: DC

## 2023-08-09 MED ORDER — CYCLOBENZAPRINE HCL 10 MG PO TABS: 10.0000 mg | ORAL_TABLET | Freq: Three times a day (TID) | ORAL | 0 refills | Status: AC | PRN

## 2023-08-09 MED ORDER — KETOROLAC TROMETHAMINE 60 MG/2ML IM SOLN
30.0000 mg | Freq: Once | INTRAMUSCULAR | Status: AC
  Administered 2023-08-09: 30 mg via INTRAMUSCULAR

## 2023-08-09 NOTE — ED Provider Notes (Signed)
[facility]    CSN: [id number] Arrival date & time: 08/09/23  1120      History   Chief Complaint Chief Complaint  Patient presents with   Back Pain    Entered by patient    HPI Linda Rhodes is a 47 y.o. female presenting for lower back pain with radiation to bilateral hips and left posterior leg to the knee.  Patient reports slipping on something at work yesterday.  States that she fell forward onto her left knee and try to catch herself.  Denies fall onto her back.  She is worried about her back because she has had 3 previous surgeries including spinal fusion about 7 years ago.  Patient says she normally does not have any pain in her back and has been doing well without the need for any pain medications.  She denies leg weakness, numbness, loss of bowel or bladder control.  Reports pain in her left knee but no difficulty weightbearing.  Minimal swelling.  Full range of motion of the knee.  Patient recently has tried ibuprofen and Tylenol without relief of pain.  Denies injury or loss of consciousness.  No other complaints or concerns.  HPI  Past Medical History:  Diagnosis Date   Abscessed tooth 09/24/2016   right lower tooth removed.   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   Herpes zoster without complication 07/18/2018   Elevated blood pressure reading 07/18/2018   Radiculopathy of lumbar region 12/15/2017   Status post lumbar laminectomy 12/15/2017   Intractable chronic migraine without aura and without status  migrainosus 08/04/2016   S/P hysterectomy 05/06/2015    Past Surgical History:  Procedure Laterality Date   ABDOMINAL HYSTERECTOMY     BACK SURGERY  2016   [facility] - ruptured L4-5   CESAREAN SECTION     CHOLECYSTECTOMY     CYSTOSCOPY N/A 05/06/2015   Procedure: CYSTOSCOPY;  Surgeon: Mallory Bing, MD;  Location: [facility];  Service: Gynecology;  Laterality: N/A;   ESOPHAGOGASTRODUODENOSCOPY (EGD) WITH PROPOFOL N/A 04/13/2018   Procedure: ESOPHAGOGASTRODUODENOSCOPY (EGD) WITH PROPOFOL;  Surgeon: Toney Reil, MD;  Location: [facility];  Service: Gastroenterology;  Laterality: N/A;   ETHMOIDECTOMY Right 11/25/2015   Procedure:  ANTERIOR ETHMOIDECTOMY;  Surgeon: Bud Face, MD;  Location: [facility];  Service: ENT;  Laterality: Right;   FRONTAL SINUS EXPLORATION Right 11/25/2015   Procedure: FRONTAL SINUSOTOMY;  Surgeon: Bud Face, MD;  Location: [facility];  Service: ENT;  Laterality: Right;   IMAGE GUIDED SINUS SURGERY N/A 11/25/2015   Procedure: IMAGE GUIDED SINUS SURGERY WITH BALLOON;  Surgeon: Bud Face, MD;  Location: [facility];  Service: ENT;  Laterality: N/A;   LAPAROSCOPIC HYSTERECTOMY N/A 05/06/2015   Procedure: HYSTERECTOMY TOTAL LAPAROSCOPIC;  Surgeon: [place] Bing, MD;  Location: [facility];  Service: Gynecology;  Laterality: N/A;   LUMBAR LAMINECTOMY/DECOMPRESSION MICRODISCECTOMY N/A 10/04/2016   Procedure: right minimally invasive L5 S1 microdiscectomy;  Surgeon: Keith Rake, MD;  Location: [facility];  Service: Neurosurgery;  Laterality: N/A;   MAXILLARY ANTROSTOMY Right 11/25/2015   Procedure: MAXILLARY ANTROSTOMY WITH TISSUE REMOVAL;  Surgeon: Bud Face, MD;  Location: [facility];  Service: ENT;  Laterality: Right;   SPINAL FUSION     WISDOM TOOTH EXTRACTION  10/2115   3 teeth removed.    OB History     Gravida  0   Para  0   Term  0   Preterm  0   AB  0   Living  0       SAB  0   IAB  0   Ectopic  0   Multiple  0   Live Births  0            Home Medications    Prior to Admission medications   Medication Sig Start Date End Date Taking? Authorizing Provider  amLODipine (NORVASC) 10 MG tablet Take 1 tablet (10 mg total) by mouth daily. 07/28/22  Yes Margarita Mail, DO  cyclobenzaprine (FLEXERIL) 10 MG tablet Take 1 tablet (10 mg total) by mouth 3 (three) times daily as needed for muscle spasms. 08/09/23  Yes Eusebio Friendly B, PA-C  metoprolol succinate (TOPROL-XL) 50 MG 24 hr tablet Take 1 tablet (50 mg total) by mouth at bedtime. Take with or immediately following a meal. 07/28/22  Yes Margarita Mail, DO  predniSONE (DELTASONE) 20 MG tablet Take 6 tabs p.o. on day 1 and decrease by 1 tablet daily until complete 08/09/23  Yes Shirlee Latch, PA-C  Varenicline Tartrate, Starter, (CHANTIX STARTING MONTH PAK) 0.5 MG X 11 & 1 MG X 42 TBPK Days 1 to 3: take 0.5 mg once daily. Days 4 to 7: take 0.5 mg twice daily. Days 8 and on take 1 mg twice daily. 11/19/22  Yes Margarita Mail, DO  escitalopram (LEXAPRO) 20 MG tablet TAKE 1 TABLET BY MOUTH EVERY DAY 12/13/19 11/14/20  Doren Custard, FNP  famotidine (PEPCID) 20 MG tablet Take 1 tablet (20 mg total) by mouth 2 (two) times daily. 03/25/18 08/19/19  Sharman Cheek, MD  promethazine (PHENERGAN) 25 MG tablet Take 1 tablet (25 mg total) by mouth every 6 (six) hours as needed for nausea or vomiting. 12/29/19 11/14/20  Amyot, Ali Lowe, NP    Family History Family History  Problem Relation Age of Onset   Diabetes Mother    Hypertension Mother    Other Mother        Covid death   Heart disease Mother    Kidney disease Mother        stage 4 renal failure   Diabetes Father    Hypertension Father    Stroke Father    Heart attack Father    Colon cancer Paternal Grandmother 81   Breast cancer Maternal Aunt 31   Breast cancer Paternal Aunt 38    Social History Social History   Tobacco Use    Smoking status: Former    Current packs/day: 0.00    Average packs/day: 0.5 packs/day for 7.0 years (3.5 ttl pk-yrs)    Types: Cigarettes    Start date: 10/18/2008    Quit date: 10/19/2015    Years since quitting: 7.8   Smokeless tobacco: Never  Vaping Use   Vaping status: Never Used  Substance Use Topics   Alcohol use: Yes    Comment: socially - 1x/mo   Drug use: No     Allergies   Aspirin and Adhesive [tape]  Review of Systems Review of Systems  Musculoskeletal:  Positive for arthralgias, back pain and joint swelling. Negative for gait problem.  Neurological:  Negative for dizziness, syncope, weakness, numbness and headaches.     Physical Exam Triage Vital Signs ED Triage Vitals  Encounter Vitals Group     BP 08/09/23 1155 (!) 140/100     Systolic BP Percentile --      Diastolic BP Percentile --      Pulse Rate 08/09/23 1155 98     Resp 08/09/23 1155 18     Temp 08/09/23 1155 98.4 F (36.9 C)     Temp src --      SpO2 08/09/23 1155 100 %     Weight --      Height --      Head Circumference --      Peak Flow --      Pain Score 08/09/23 1154 7     Pain Loc --      Pain Education --      Exclude from Growth Chart --    No data found.  Updated Vital Signs BP (!) 140/100   Pulse 98   Temp 98.4 F (36.9 C)   Resp 18   LMP  (LMP Unknown)   SpO2 100%     Physical Exam Vitals and nursing note reviewed.  Constitutional:      General: She is not in acute distress.    Appearance: Normal appearance. She is not ill-appearing or toxic-appearing.  HENT:     Head: Normocephalic and atraumatic.  Eyes:     General: No scleral icterus.       Right eye: No discharge.        Left eye: No discharge.     Conjunctiva/sclera: Conjunctivae normal.  Cardiovascular:     Rate and Rhythm: Normal rate and regular rhythm.     Heart sounds: Normal heart sounds.  Pulmonary:     Effort: Pulmonary effort is normal. No respiratory distress.     Breath sounds: Normal breath  sounds.  Musculoskeletal:     Cervical back: Neck supple.     Lumbar back: Tenderness (bilateral paralumbar muscles, left glutes) present. No signs of trauma. Decreased range of motion. Positive left straight leg raise test. Negative right straight leg raise test.     Left knee: Swelling (mild) present. Normal range of motion. Tenderness present over the medial joint line and lateral joint line. Normal pulse.     Comments: Linear post-surgical scar lumbar region  Skin:    General: Skin is dry.  Neurological:     General: No focal deficit present.     Mental Status: She is alert. Mental status is at baseline.     Motor: No weakness.     Gait: Gait normal.  Psychiatric:        Mood and Affect: Mood normal.      UC Treatments / Results  Labs (all labs ordered are listed, but only abnormal results are displayed) Labs Reviewed - No data to display  EKG   Radiology DG Lumbar Spine Complete  Result Date: 08/09/2023 CLINICAL DATA:  Lower back pain after falling EXAM: LUMBAR SPINE - COMPLETE 4+ VIEW COMPARISON:  Lumbar spine radiographs 08/01/2020 FINDINGS: Surgical changes of prior L5-S1 posterior lumbar interbody fusion with interbody graft in place. No evidence of hardware complication or change in configuration. No acute fracture. Vertebral body heights are maintained. No significant adjacent level disc disease. Surgical clips in the  right upper quadrant suggest prior cholecystectomy. IMPRESSION: Stable postoperative changes without evidence of hardware complication, new fracture or malalignment. Electronically Signed   By: Malachy Moan M.D.   On: 08/09/2023 15:02    Procedures Procedures (including critical care time)  Medications Ordered in UC Medications  ketorolac (TORADOL) injection 30 mg (30 mg Intramuscular Given 08/09/23 1303)    Initial Impression / Assessment and Plan / UC Course  I have reviewed the triage vital signs and the nursing notes.  Pertinent labs &  imaging results that were available during my care of the patient were reviewed by me and considered in my medical decision making (see chart for details).   47 year old female with history of 3 previous back surgeries presents for lower back pain with radiation to the left posterior leg, left knee pain and mild swelling after accidental slip and fall at work yesterday.  Denies any red flag signs or symptoms.  Has been taking OTC meds without relief.  X-ray of L-spine obtained today given the fact that she has had a fusion and has hardware in her spine.  Assessing for potential fracture or damage to hardware.  Compared these images to previous x-rays and did not note any acute changes.  Will contact patient with radiology report if it states anything different.  Patient declines x-ray of her knee stating the knee pain is 3 out of 10.  No difficulty weightbearing and minimal swelling.  Minimal tenderness.  Concern for sciatica/lumbar radiculopathy.  Treating patient at this time with anti-inflammatory medicine.  She was given 30 mg IM ketorolac in clinic for acute pain relief.  Sent prednisone taper and cyclobenzaprine to pharmacy.  Encouraged to follow RICE guidelines.  Work restrictions written.  Advised to follow-up with her spine specialist if symptoms persist or worsen as she may need an MRI.  X-ray over read reveals no acute fractures.  Stable postop changes without evidence of hardware complication or malalignment.  Final Clinical Impressions(s) / UC Diagnoses   Final diagnoses:  Acute bilateral low back pain with left-sided sciatica  Strain of lumbar region, initial encounter  Left knee injury, initial encounter  Work related injury     Discharge Instructions      -I did not see any fractures or problems with your hardware on the x-ray but we will call you if the radiologist sees something different. - Concerned about a pinched nerve or possible herniated disc.  We gave you an  injection of an anti-inflammatory medicine in the clinic for pain.  Start prednisone taper when you get it.  May also take muscle relaxer. - No bending, heavy lifting until your symptoms improve over the next week. - If you are not feeling better over the next 1 to 2 weeks or symptoms worsen you may need to follow back up with your spine provider as you might need an MRI.  BACK PAIN RED FLAGS: If the back pain acutely worsens or there are any red flag symptoms such as numbness/tingling, leg weakness, saddle anesthesia, or loss of bowel/bladder control, go immediately to the ER. Follow up with [place] as scheduled or sooner if the pain does not begin to resolve or if it worsens before the follow up    -If knee pain worsens please return or follow-up with [facility] for evaluation.  Ice the knee and elevate it.  May take Tylenol for pain and hopefully the prednisone helps 2.     ED Prescriptions     Medication Sig Dispense  Auth. Provider   predniSONE (DELTASONE) 20 MG tablet Take 6 tabs p.o. on day 1 and decrease by 1 tablet daily until complete 21 tablet Eusebio Friendly B, PA-C   cyclobenzaprine (FLEXERIL) 10 MG tablet Take 1 tablet (10 mg total) by mouth 3 (three) times daily as needed for muscle spasms. 20 tablet Shirlee Latch, PA-C      I have reviewed the PDMP during this encounter.   Shirlee Latch, PA-C 08/09/23 1620

## 2023-08-09 NOTE — ED Triage Notes (Signed)
Low back pain today. Pt states she has had 3 back surgeries with spinal fusion and she slipped and fell yesterday and has been in pain since.

## 2023-08-09 NOTE — Discharge Instructions (Addendum)
-  I did not see any fractures or problems with your hardware on the x-ray but we will call you if the radiologist sees something different. - Concerned about a pinched nerve or possible herniated disc.  We gave you an injection of an anti-inflammatory medicine in the clinic for pain.  Start prednisone taper when you get it.  May also take muscle relaxer. - No bending, heavy lifting until your symptoms improve over the next week. - If you are not feeling better over the next 1 to 2 weeks or symptoms worsen you may need to follow back up with your spine provider as you might need an MRI.  BACK PAIN RED FLAGS: If the back pain acutely worsens or there are any red flag symptoms such as numbness/tingling, leg weakness, saddle anesthesia, or loss of bowel/bladder control, go immediately to the ER. Follow up with [place] as scheduled or sooner if the pain does not begin to resolve or if it worsens before the follow up    -If knee pain worsens please return or follow-up with [facility] for evaluation.  Ice the knee and elevate it.  May take Tylenol for pain and hopefully the prednisone helps 2.

## 2023-08-27 ENCOUNTER — Encounter: Payer: Self-pay | Admitting: Emergency Medicine

## 2023-08-27 ENCOUNTER — Ambulatory Visit
Admission: EM | Admit: 2023-08-27 | Discharge: 2023-08-27 | Disposition: A | Discharge status: Home / Self Care | Payer: BC Managed Care – PPO | Attending: Emergency Medicine | Admitting: Emergency Medicine

## 2023-08-27 DIAGNOSIS — R21 Rash and other nonspecific skin eruption: Secondary | ICD-10-CM | POA: Diagnosis not present

## 2023-08-27 DIAGNOSIS — B029 Zoster without complications: Secondary | ICD-10-CM

## 2023-08-27 MED ORDER — MUPIROCIN 2 % EX OINT: 1.0000 | TOPICAL_OINTMENT | Freq: Two times a day (BID) | CUTANEOUS | 0 refills | Status: AC

## 2023-08-27 MED ORDER — GABAPENTIN 300 MG PO CAPS: ORAL_CAPSULE | ORAL | 0 refills | Status: AC

## 2023-08-27 MED ORDER — VALACYCLOVIR HCL 1 G PO TABS: 1000.0000 mg | ORAL_TABLET | Freq: Three times a day (TID) | ORAL | 0 refills | Status: AC

## 2023-08-27 NOTE — ED Triage Notes (Signed)
Patient reports red itchy bumps on the right side of chest and on the left side of her shoulder that started on Monday.  Patient has been putting calamine lotion on the rash.  Patient denies fevers. Patient reports history of shingles in the past.

## 2023-08-27 NOTE — ED Provider Notes (Signed)
HPI  SUBJECTIVE:  Linda Rhodes is a 47 y.o. female who presents with a painful, pruritic rash proceeded with burning pins-and-needles like paresthesias on her right chest starting 2 days ago.  She also reports a rash on her left shoulder.  She denies any other thoracic pain.  No fevers, new lotions, soaps, detergents, exposure to poison ivy or poison oak.  She states that the rash on her chest is identical to previous shingles.  When she gets shingles, she states that is usually located in this area, and is treated with acyclovir and Neurontin.  She has been applying calamine lotion, alcohol to her chest and Neosporin to her left shoulder without improvement in her symptoms.  Symptoms are worse when she lays.  She has a past medical history of varicella/shingles, hypertension.  No history of chronic kidney disease, MRSA.  PCP: [facility].   Past Medical History:  Diagnosis Date   Abscessed tooth 09/24/2016   right lower tooth removed.   Anemia    in past, prior to removal of uterine fibroids   Bladder infection    Difficult intubation    GERD (gastroesophageal reflux disease)    Headache    migrains   Hypertension    Motion sickness    all moving vehicles   Ovarian cyst    PONV (postoperative nausea and vomiting)     Past Surgical History:  Procedure Laterality Date   ABDOMINAL HYSTERECTOMY     BACK SURGERY  2016   [facility] - ruptured L4-5   CESAREAN SECTION     CHOLECYSTECTOMY     CYSTOSCOPY N/A 05/06/2015   Procedure: CYSTOSCOPY;  Surgeon: [place] Bing, MD;  Location: [facility];  Service: Gynecology;  Laterality: N/A;   ESOPHAGOGASTRODUODENOSCOPY (EGD) WITH PROPOFOL N/A 04/13/2018   Procedure: ESOPHAGOGASTRODUODENOSCOPY (EGD) WITH PROPOFOL;  Surgeon: Toney Reil, MD;  Location: [facility];  Service: Gastroenterology;  Laterality: N/A;   ETHMOIDECTOMY Right 11/25/2015   Procedure:  ANTERIOR ETHMOIDECTOMY;  Surgeon: Bud Face, MD;   Location: [facility];  Service: ENT;  Laterality: Right;   FRONTAL SINUS EXPLORATION Right 11/25/2015   Procedure: FRONTAL SINUSOTOMY;  Surgeon: Bud Face, MD;  Location: [facility];  Service: ENT;  Laterality: Right;   IMAGE GUIDED SINUS SURGERY N/A 11/25/2015   Procedure: IMAGE GUIDED SINUS SURGERY WITH BALLOON;  Surgeon: Bud Face, MD;  Location: [facility];  Service: ENT;  Laterality: N/A;   LAPAROSCOPIC HYSTERECTOMY N/A 05/06/2015   Procedure: HYSTERECTOMY TOTAL LAPAROSCOPIC;  Surgeon: Ruth Bing, MD;  Location: [facility];  Service: Gynecology;  Laterality: N/A;   LUMBAR LAMINECTOMY/DECOMPRESSION MICRODISCECTOMY N/A 10/04/2016   Procedure: right minimally invasive L5 S1 microdiscectomy;  Surgeon: Keith Rake, MD;  Location: [facility];  Service: Neurosurgery;  Laterality: N/A;   MAXILLARY ANTROSTOMY Right 11/25/2015   Procedure: MAXILLARY ANTROSTOMY WITH TISSUE REMOVAL;  Surgeon: Bud Face, MD;  Location: [facility];  Service: ENT;  Laterality: Right;   SPINAL FUSION     WISDOM TOOTH EXTRACTION  10/2115   3 teeth removed.    Family History  Problem Relation Age of Onset   Diabetes Mother    Hypertension Mother    Other Mother        Covid death   Heart disease Mother    Kidney disease Mother        stage 4 renal failure   Diabetes Father    Hypertension Father    Stroke Father    Heart attack  Father    Colon cancer Paternal Grandmother 52   Breast cancer Maternal Aunt 20   Breast cancer Paternal Aunt 54    Social History   Tobacco Use   Smoking status: Former    Current packs/day: 0.00    Average packs/day: 0.5 packs/day for 7.0 years (3.5 ttl pk-yrs)    Types: Cigarettes    Start date: 10/18/2008    Quit date: 10/19/2015    Years since quitting: 7.8   Smokeless tobacco: Never  Vaping Use   Vaping status: Never Used  Substance Use Topics   Alcohol use: Yes    Comment: socially - 1x/mo   Drug use: No    No  current facility-administered medications for this encounter.  Current Outpatient Medications:    amLODipine (NORVASC) 10 MG tablet, Take 1 tablet (10 mg total) by mouth daily., Disp: 90 tablet, Rfl: 1   cyclobenzaprine (FLEXERIL) 10 MG tablet, Take 1 tablet (10 mg total) by mouth 3 (three) times daily as needed for muscle spasms., Disp: 20 tablet, Rfl: 0   gabapentin (NEURONTIN) 300 MG capsule, 1 tab po at bedtime on day 1, 1 tablet bid on day 2, then 1 tablet tid on day 3., Disp: 30 capsule, Rfl: 0   metoprolol succinate (TOPROL-XL) 50 MG 24 hr tablet, Take 1 tablet (50 mg total) by mouth at bedtime. Take with or immediately following a meal., Disp: 90 tablet, Rfl: 1   mupirocin ointment (BACTROBAN) 2 %, Apply 1 Application topically 2 (two) times daily., Disp: 22 g, Rfl: 0   oxyCODONE (OXY IR/ROXICODONE) 5 MG immediate release tablet, Take 5 mg by mouth every 6 (six) hours as needed., Disp: , Rfl:    valACYclovir (VALTREX) 1000 MG tablet, Take 1 tablet (1,000 mg total) by mouth 3 (three) times daily for 7 days., Disp: 21 tablet, Rfl: 0   Varenicline Tartrate, Starter, (CHANTIX STARTING MONTH PAK) 0.5 MG X 11 & 1 MG X 42 TBPK, Days 1 to 3: take 0.5 mg once daily. Days 4 to 7: take 0.5 mg twice daily. Days 8 and on take 1 mg twice daily., Disp: 1 each, Rfl: 0  Allergies  Allergen Reactions   Aspirin Other (See Comments)    Causes burning feeling in her stomach.    Adhesive [Tape] Rash    And skin tears from stronger tapes     ROS  As noted in HPI.   Physical Exam  BP (!) 137/95 (BP Location: Left Arm)   Pulse 85   Temp 98.4 F (36.9 C) (Oral)   Resp 14   Ht 5\' 10"  (1.778 m)   Wt 65.7 kg   LMP  (LMP Unknown)   SpO2 95%   BMI 20.78 kg/m   Constitutional: Well developed, well nourished, no acute distress Eyes:  EOMI, conjunctiva normal bilaterally HENT: Normocephalic, atraumatic,mucus membranes moist Respiratory: Normal inspiratory effort Cardiovascular: Normal rate GI:  nondistended skin: Exquisitely tender grouped papular, pustular rash over right chest   Nontender scattered papules over left shoulder without any crusting or erythema  Musculoskeletal: no deformities Neurologic: Alert & oriented x 3, no focal neuro deficits Psychiatric: Speech and behavior appropriate   ED Course   Medications - No data to display  No orders of the defined types were placed in this encounter.   No results found for this or any previous visit (from the past 24 hour(s)). No results found.  ED Clinical Impression  1. Herpes zoster without complication   2. Rash  ED Assessment/Plan      The rash on her chest is consistent with shingles.  Home with Valtrex, Neurontin, Bactroban.  She declined pain medications although I advised her that ibuprofen combined with Tylenol is a very effective combination for pain.  Looks like she has a prescription for oxycodone starting yesterday.  The rash on her shoulder does not appear to be shingles as it is nontender and has a different appearance.  There is no rash elsewhere concerning for disseminated zoster.  She can put Bactroban on the shoulder rash as well.  Follow-up with PCP in 4 to 5 days.  ER return precautions given.  Discussed MDM, treatment plan, and plan for follow-up with patient. Discussed sn/sx that should prompt return to the ED. patient agrees with plan.   Meds ordered this encounter  Medications   valACYclovir (VALTREX) 1000 MG tablet    Sig: Take 1 tablet (1,000 mg total) by mouth 3 (three) times daily for 7 days.    Dispense:  21 tablet    Refill:  0   gabapentin (NEURONTIN) 300 MG capsule    Sig: 1 tab po at bedtime on day 1, 1 tablet bid on day 2, then 1 tablet tid on day 3.    Dispense:  30 capsule    Refill:  0   mupirocin ointment (BACTROBAN) 2 %    Sig: Apply 1 Application topically 2 (two) times daily.    Dispense:  22 g    Refill:  0      *This clinic note was created using Administrator, sports. Therefore, there may be occasional mistakes despite careful proofreading.  ?    Domenick Gong, MD 08/28/23 [phone]

## 2023-08-27 NOTE — Discharge Instructions (Signed)
Put the Bactroban on your left shoulder.  You can also put it on your right chest when it blisters up and crusts over.  Take the Valtrex and the Neurontin as written.

## 2023-08-29 ENCOUNTER — Ambulatory Visit: Payer: Medicaid Other | Admitting: Physician Assistant

## 2024-02-13 ENCOUNTER — Encounter: Payer: Self-pay | Admitting: Internal Medicine

## 2024-02-13 DIAGNOSIS — I10 Essential (primary) hypertension: Secondary | ICD-10-CM

## 2024-02-13 MED ORDER — AMLODIPINE BESYLATE 10 MG PO TABS: 10.0000 mg | ORAL_TABLET | Freq: Every day | ORAL | 0 refills | Status: AC

## 2024-03-01 ENCOUNTER — Ambulatory Visit: Payer: Self-pay

## 2024-03-01 NOTE — Telephone Encounter (Signed)
 Copied from CRM [id number]. Topic: Clinical - Red Word Triage >> Mar 01, 2024 12:05 PM Turkey B wrote: [facility] that prompted transfer to Nurse Triage: pt has rash on chest, that's a little itchy and burning   Chief Complaint: Rash on chest Symptoms: itching, burning Frequency: x 1 day Pertinent Negatives: Patient denies fever, nausea, vomiting, diarrhea, difficulty breathing Disposition: [] ED /[] Urgent Care (no appt availability in office) / [x] Appointment(In office/virtual)/ []  [facility]/ [] Home Care/ [] Refused Recommended Disposition /[] [facility]/ []  Follow-up with PCP Additional Notes: Patient called and advised  Patient denies fever, nausea, vomiting, diarrhea, difficulty breathing.  Patient wanted a virtual visit for this rash and a virtual visit was made for tomorrow 03/02/2024 at 8am with Donny Gall at patient's PCP office.  Patient wanted to also get a refill on her blood pressure medication but states that she cannot come in for a in person visit at this time.  Care Advice is given as per protocol. Caller is advised that if anything worsens to go to the Emergency Room. Caller verbalized understanding and agreed with plan.   Reason for Disposition  [1] Shingles rash (matches SYMPTOMS) AND [2] onset < 72 hours ago (3 days)  Answer Assessment - Initial Assessment Questions 1. APPEARANCE of RASH: "Describe the rash."      Pt states it looks like shingles 2. LOCATION: "Where is the rash located?"      On chest 3. ONSET: "When did the rash start?"      yesterday 4. ITCHING: "Does the rash itch?" If Yes, ask: "How bad is the itch?"  (Scale 1-10; or mild, moderate, severe)     "It doesn't itch that bad" 5. PAIN: "Does the rash hurt?" If Yes, ask: "How bad is the pain?"  (Scale 0-10; or none, mild, moderate, severe)    - NONE (0): no pain    - MILD (1-3): doesn't interfere with normal activities     - MODERATE (4-7): interferes with normal activities  or awakens from sleep     - SEVERE (8-10): excruciating pain, unable to do any normal activities     5 6. OTHER SYMPTOMS: "Do you have any other symptoms?" (e.g., fever)     Headache since yesterday 7. PREGNANCY: "Is there any chance you are pregnant?" "When was your last menstrual period?"     No--hysterectomy  Protocols used: Shingles (Zoster)-A-AH

## 2024-03-02 ENCOUNTER — Telehealth (INDEPENDENT_AMBULATORY_CARE_PROVIDER_SITE_OTHER): Admitting: Nurse Practitioner

## 2024-03-02 ENCOUNTER — Encounter: Payer: Self-pay | Admitting: Nurse Practitioner

## 2024-03-02 ENCOUNTER — Other Ambulatory Visit: Payer: Self-pay

## 2024-03-02 DIAGNOSIS — B029 Zoster without complications: Secondary | ICD-10-CM | POA: Diagnosis not present

## 2024-03-02 MED ORDER — VALACYCLOVIR HCL 1 G PO TABS: 1000.0000 mg | ORAL_TABLET | Freq: Three times a day (TID) | ORAL | 0 refills | Status: AC

## 2024-03-02 NOTE — Progress Notes (Signed)
 Name: Linda Rhodes   MRN: [id number]    DOB: 1976/10/04   Date:03/02/2024       Progress Note  Subjective  Chief Complaint  Chief Complaint  Patient presents with   Rash    Possible shingles on chest for 2 days painful, blistery and red    I connected with  AHLIYAH FROTHINGHAM  on 03/02/24 at  8:00 AM EDT by a video enabled telemedicine application and verified that I am speaking with the correct person using two identifiers.  I discussed the limitations of evaluation and management by telemedicine and the availability of in person appointments. The patient expressed understanding and agreed to proceed with a virtual visit  Staff also discussed with the patient that there may be a patient responsible charge related to this service. Patient Location: home Provider Location: [facility] Additional Individuals present: alone  HPI   Discussed the use of AI scribe software for clinical note transcription with the patient, who gave verbal consent to proceed.  History of Present Illness Linda Rhodes "MANDY" is a 48 year old female who presents with a shingles outbreak on her chest.  Her shingles outbreak began approximately two days ago, located on her chest, with a burning sensation and the appearance of a blister.  She is concerned about the outbreak due to upcoming travel plans in two weeks and wants to avoid scarring.  She has gabapentin  available at home, previously prescribed for back pain, which she can use if needed.    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   Herpes zoster without complication 07/18/2018   Elevated blood pressure reading 07/18/2018   Radiculopathy of lumbar region 12/15/2017   Status post lumbar laminectomy 12/15/2017   Intractable chronic migraine without aura and without status migrainosus 08/04/2016   S/P  hysterectomy 05/06/2015    Social History   Tobacco Use   Smoking status: Former    Current packs/day: 0.00    Average packs/day: 0.5 packs/day for 7.0 years (3.5 ttl pk-yrs)    Types: Cigarettes    Start date: 10/18/2008    Quit date: 10/19/2015    Years since quitting: 8.3   Smokeless tobacco: Never  Substance Use Topics   Alcohol use: Yes    Comment: socially - 1x/mo     Current Outpatient Medications:    amLODipine  (NORVASC ) 10 MG tablet, Take 1 tablet (10 mg total) by mouth daily., Disp: 30 tablet, Rfl: 0   cyclobenzaprine  (FLEXERIL ) 10 MG tablet, Take 1 tablet (10 mg total) by mouth 3 (three) times daily as needed for muscle spasms., Disp: 20 tablet, Rfl: 0   gabapentin  (NEURONTIN ) 300 MG capsule, 1 tab po at bedtime on day 1, 1 tablet bid on day 2, then 1 tablet tid on day 3., Disp: 30 capsule, Rfl: 0   metoprolol  succinate (TOPROL -XL) 50 MG 24 hr tablet, Take 1 tablet (50 mg total) by mouth at bedtime. Take with or immediately following a meal., Disp: 90 tablet, Rfl: 1   mupirocin  ointment (BACTROBAN ) 2 %, Apply 1 Application topically 2 (two) times daily., Disp: 22 g, Rfl: 0   valACYclovir  (VALTREX ) 1000 MG tablet, Take 1 tablet (1,000 mg total) by mouth 3 (three) times daily for 7 days., Disp: 21 tablet, Rfl: 0   oxyCODONE  (OXY IR/ROXICODONE ) 5 MG immediate release tablet, Take 5 mg by mouth  every 6 (six) hours as needed. (Patient not taking: Reported on 03/02/2024), Disp: , Rfl:    Varenicline  Tartrate, Starter, (CHANTIX  STARTING MONTH PAK) 0.5 MG X 11 & 1 MG X 42 TBPK, Days 1 to 3: take 0.5 mg once daily. Days 4 to 7: take 0.5 mg twice daily. Days 8 and on take 1 mg twice daily. (Patient not taking: Reported on 03/02/2024), Disp: 1 each, Rfl: 0  Allergies  Allergen Reactions   Aspirin Other (See Comments)    Causes burning feeling in her stomach.    Adhesive [Tape] Rash    And skin tears from stronger tapes    I personally reviewed active problem list, medication list,  allergies with the patient/caregiver today.  ROS  Ten systems reviewed and is negative except as mentioned in HPI   Objective  Virtual encounter, vitals not obtained.  There is no height or weight on file to calculate BMI.  Nursing Note and Vital Signs reviewed.  Physical Exam  Awake, alert and oriented, speaking in complete sentences   No results found for this or any previous visit (from the past 72 hours).  Assessment & Plan  Assessment and Plan Assessment & Plan Shingles Recurrent shingles outbreak on the chest for two days, characterized by a burning sensation and blister formation. She is concerned about upcoming travel in two weeks and has gabapentin  available for pain management. - Prescribe Valtrex  1000 mg three times daily for seven days. - Advise use of gabapentin  for pain management as needed.      -Red flags and when to present for emergency care or RTC including fever >101.62F, chest pain, shortness of breath, new/worsening/un-resolving symptoms,  reviewed with patient at time of visit. Follow up and care instructions discussed and provided in AVS. - I discussed the assessment and treatment plan with the patient. The patient was provided an opportunity to ask questions and all were answered. The patient agreed with the plan and demonstrated an understanding of the instructions.  I provided 15 minutes of non-face-to-face time during this encounter.  Quinton Buckler, FNP

## 2024-03-02 NOTE — Addendum Note (Signed)
 Addended by: Deandre Brannan F on: 03/02/2024 08:13 AM   Modules accepted: Level of Service

## 2024-04-26 ENCOUNTER — Other Ambulatory Visit: Payer: Self-pay | Admitting: Internal Medicine

## 2024-04-26 DIAGNOSIS — I10 Essential (primary) hypertension: Secondary | ICD-10-CM

## 2024-04-27 NOTE — Telephone Encounter (Signed)
 Last refill courtesy- needs appointment - last OV 08/04/22 Requested Prescriptions  Pending Prescriptions Disp Refills   amLODipine  (NORVASC ) 10 MG tablet [Pharmacy Med Name: AMLODIPINE  BESYLATE 10 MG TAB] 30 tablet 0    Sig: TAKE 1 TABLET BY MOUTH EVERY DAY     Cardiovascular: Calcium Channel Blockers 2 Failed - 04/27/2024  3:54 PM      Failed - Last BP in normal range    BP Readings from Last 1 Encounters:  08/27/23 (!) 137/95         Failed - Valid encounter within last 6 months    Recent Outpatient Visits           1 month ago Herpes zoster without complication   [facility] Gareth Mliss [place], [place]              Passed - Last Heart Rate in normal range    Pulse Readings from Last 1 Encounters:  08/27/23 85
